# Patient Record
Sex: Female | Born: 1945
Health system: Southern US, Community
[De-identification: ages and names within clinical notes are randomized; demographics above are authoritative.]

## PROBLEM LIST (undated history)

## (undated) DIAGNOSIS — R42 Dizziness and giddiness: Secondary | ICD-10-CM

## (undated) DIAGNOSIS — M8580 Other specified disorders of bone density and structure, unspecified site: Secondary | ICD-10-CM

## (undated) DIAGNOSIS — I10 Essential (primary) hypertension: Secondary | ICD-10-CM

## (undated) DIAGNOSIS — J019 Acute sinusitis, unspecified: Secondary | ICD-10-CM

## (undated) DIAGNOSIS — M79609 Pain in unspecified limb: Secondary | ICD-10-CM

## (undated) DIAGNOSIS — K219 Gastro-esophageal reflux disease without esophagitis: Secondary | ICD-10-CM

## (undated) DIAGNOSIS — F411 Generalized anxiety disorder: Secondary | ICD-10-CM

## (undated) DIAGNOSIS — IMO0001 Reserved for inherently not codable concepts without codable children: Secondary | ICD-10-CM

## (undated) DIAGNOSIS — R1013 Epigastric pain: Secondary | ICD-10-CM

## (undated) DIAGNOSIS — D869 Sarcoidosis, unspecified: Secondary | ICD-10-CM

## (undated) DIAGNOSIS — K589 Irritable bowel syndrome without diarrhea: Secondary | ICD-10-CM

## (undated) DIAGNOSIS — J309 Allergic rhinitis, unspecified: Secondary | ICD-10-CM

## (undated) DIAGNOSIS — M549 Dorsalgia, unspecified: Secondary | ICD-10-CM

## (undated) DIAGNOSIS — M1990 Unspecified osteoarthritis, unspecified site: Secondary | ICD-10-CM

## (undated) DIAGNOSIS — T7840XA Allergy, unspecified, initial encounter: Secondary | ICD-10-CM

## (undated) DIAGNOSIS — Z86010 Personal history of colonic polyps: Secondary | ICD-10-CM

## (undated) DIAGNOSIS — R079 Chest pain, unspecified: Secondary | ICD-10-CM

## (undated) DIAGNOSIS — E785 Hyperlipidemia, unspecified: Secondary | ICD-10-CM

## (undated) DIAGNOSIS — R5383 Other fatigue: Secondary | ICD-10-CM

## (undated) DIAGNOSIS — R5381 Other malaise: Secondary | ICD-10-CM

## (undated) DIAGNOSIS — J383 Other diseases of vocal cords: Secondary | ICD-10-CM

## (undated) DIAGNOSIS — R490 Dysphonia: Secondary | ICD-10-CM

## (undated) HISTORY — DX: Dizziness and giddiness: R42

## (undated) HISTORY — PX: OTHER SURGICAL HISTORY: SHX169

## (undated) HISTORY — DX: Reserved for inherently not codable concepts without codable children: IMO0001

## (undated) HISTORY — DX: Allergic rhinitis, unspecified: J30.9

## (undated) HISTORY — DX: Gastro-esophageal reflux disease without esophagitis: K21.9

## (undated) HISTORY — DX: Hyperlipidemia, unspecified: E78.5

## (undated) HISTORY — DX: Pain in unspecified limb: M79.609

## (undated) HISTORY — DX: Irritable bowel syndrome without diarrhea: K58.9

## (undated) HISTORY — DX: Essential (primary) hypertension: I10

## (undated) HISTORY — DX: Unspecified osteoarthritis, unspecified site: M19.90

## (undated) HISTORY — DX: Allergy, unspecified, initial encounter: T78.40XA

## (undated) HISTORY — DX: Other specified disorders of bone density and structure, unspecified site: M85.80

## (undated) HISTORY — DX: Acute sinusitis, unspecified: J01.90

## (undated) HISTORY — DX: Generalized anxiety disorder: F41.1

## (undated) HISTORY — DX: Other fatigue: R53.83

## (undated) HISTORY — DX: Dorsalgia, unspecified: M54.9

## (undated) HISTORY — DX: Other malaise: R53.81

## (undated) HISTORY — DX: Chest pain, unspecified: R07.9

## (undated) HISTORY — DX: Epigastric pain: R10.13

## (undated) HISTORY — DX: Personal history of colonic polyps: Z86.010

## (undated) HISTORY — DX: Dysphonia: R49.0

## (undated) HISTORY — DX: Sarcoidosis, unspecified: D86.9

## (undated) HISTORY — DX: Other diseases of vocal cords: J38.3

---

## 1992-08-30 HISTORY — PX: ABDOMINAL HYSTERECTOMY: SHX81

## 2002-04-23 ENCOUNTER — Encounter: Payer: Self-pay | Admitting: Family Medicine

## 2002-04-23 ENCOUNTER — Encounter: Admission: RE | Admit: 2002-04-23 | Discharge: 2002-04-23 | Payer: Self-pay | Admitting: Family Medicine

## 2004-11-02 ENCOUNTER — Ambulatory Visit: Payer: Self-pay | Admitting: Gastroenterology

## 2004-11-14 ENCOUNTER — Encounter (INDEPENDENT_AMBULATORY_CARE_PROVIDER_SITE_OTHER): Payer: Self-pay | Admitting: Specialist

## 2004-11-14 ENCOUNTER — Ambulatory Visit: Payer: Self-pay | Admitting: Gastroenterology

## 2004-11-16 ENCOUNTER — Encounter: Admission: RE | Admit: 2004-11-16 | Discharge: 2004-11-16 | Payer: Self-pay | Admitting: Family Medicine

## 2005-05-07 ENCOUNTER — Other Ambulatory Visit: Admission: RE | Admit: 2005-05-07 | Discharge: 2005-05-07 | Payer: Self-pay | Admitting: Gynecology

## 2005-05-15 ENCOUNTER — Ambulatory Visit (HOSPITAL_COMMUNITY): Admission: RE | Admit: 2005-05-15 | Discharge: 2005-05-15 | Payer: Self-pay | Admitting: Gynecology

## 2005-05-15 ENCOUNTER — Encounter: Payer: Self-pay | Admitting: Internal Medicine

## 2006-05-09 ENCOUNTER — Other Ambulatory Visit: Admission: RE | Admit: 2006-05-09 | Discharge: 2006-05-09 | Payer: Self-pay | Admitting: Obstetrics and Gynecology

## 2006-11-28 ENCOUNTER — Encounter: Admission: RE | Admit: 2006-11-28 | Discharge: 2006-11-28 | Payer: Self-pay | Admitting: Family Medicine

## 2007-05-23 ENCOUNTER — Encounter: Payer: Self-pay | Admitting: Internal Medicine

## 2008-06-03 ENCOUNTER — Encounter: Payer: Self-pay | Admitting: Women's Health

## 2008-06-03 ENCOUNTER — Encounter: Payer: Self-pay | Admitting: Internal Medicine

## 2008-06-03 ENCOUNTER — Other Ambulatory Visit: Admission: RE | Admit: 2008-06-03 | Discharge: 2008-06-03 | Payer: Self-pay | Admitting: Obstetrics and Gynecology

## 2008-06-03 ENCOUNTER — Ambulatory Visit: Payer: Self-pay | Admitting: Women's Health

## 2008-06-07 ENCOUNTER — Encounter: Payer: Self-pay | Admitting: Internal Medicine

## 2008-06-07 ENCOUNTER — Encounter: Admission: RE | Admit: 2008-06-07 | Discharge: 2008-06-07 | Payer: Self-pay | Admitting: Obstetrics and Gynecology

## 2008-10-18 ENCOUNTER — Ambulatory Visit: Payer: Self-pay | Admitting: Internal Medicine

## 2008-10-18 DIAGNOSIS — D869 Sarcoidosis, unspecified: Secondary | ICD-10-CM

## 2008-10-18 DIAGNOSIS — E785 Hyperlipidemia, unspecified: Secondary | ICD-10-CM

## 2008-10-18 DIAGNOSIS — Z860100 Personal history of colon polyps, unspecified: Secondary | ICD-10-CM

## 2008-10-18 DIAGNOSIS — I10 Essential (primary) hypertension: Secondary | ICD-10-CM | POA: Insufficient documentation

## 2008-10-18 DIAGNOSIS — F411 Generalized anxiety disorder: Secondary | ICD-10-CM

## 2008-10-18 DIAGNOSIS — K589 Irritable bowel syndrome without diarrhea: Secondary | ICD-10-CM

## 2008-10-18 HISTORY — DX: Sarcoidosis, unspecified: D86.9

## 2008-10-18 HISTORY — DX: Essential (primary) hypertension: I10

## 2008-10-18 HISTORY — DX: Irritable bowel syndrome, unspecified: K58.9

## 2008-10-18 HISTORY — DX: Personal history of colonic polyps: Z86.010

## 2008-10-18 HISTORY — DX: Generalized anxiety disorder: F41.1

## 2008-10-18 HISTORY — DX: Personal history of colon polyps, unspecified: Z86.0100

## 2008-10-18 HISTORY — DX: Hyperlipidemia, unspecified: E78.5

## 2008-10-18 LAB — CONVERTED CEMR LAB
Alkaline Phosphatase: 33 units/L — ABNORMAL LOW (ref 39–117)
BUN: 19 mg/dL (ref 6–23)
Calcium: 9.4 mg/dL (ref 8.4–10.5)
Cholesterol: 206 mg/dL — ABNORMAL HIGH (ref 0–200)
Creatinine, Ser: 0.7 mg/dL (ref 0.4–1.2)
Direct LDL: 83.8 mg/dL
Eosinophils Absolute: 0.2 10*3/uL (ref 0.0–0.7)
Eosinophils Relative: 4 % (ref 0.0–5.0)
GFR calc non Af Amer: 108.32 mL/min (ref >60)
Glucose, Bld: 99 mg/dL (ref 70–99)
Leukocytes, UA: NEGATIVE
Lymphs Abs: 1.5 10*3/uL (ref 0.7–4.0)
MCHC: 34.2 g/dL (ref 30.0–36.0)
MCV: 90.5 fL (ref 78.0–100.0)
Monocytes Absolute: 0.4 10*3/uL (ref 0.1–1.0)
Monocytes Relative: 9.3 % (ref 3.0–12.0)
Neutro Abs: 2.2 10*3/uL (ref 1.4–7.7)
Potassium: 4.3 meq/L (ref 3.5–5.1)
Total CHOL/HDL Ratio: 2
VLDL: 9 mg/dL (ref 0.0–40.0)

## 2008-10-20 LAB — CONVERTED CEMR LAB: Vit D, 25-Hydroxy: 21 ng/mL — ABNORMAL LOW (ref 30–89)

## 2009-03-21 ENCOUNTER — Ambulatory Visit: Payer: Self-pay | Admitting: Internal Medicine

## 2009-03-21 DIAGNOSIS — J019 Acute sinusitis, unspecified: Secondary | ICD-10-CM

## 2009-03-21 DIAGNOSIS — IMO0001 Reserved for inherently not codable concepts without codable children: Secondary | ICD-10-CM

## 2009-03-21 HISTORY — DX: Acute sinusitis, unspecified: J01.90

## 2009-03-21 HISTORY — DX: Reserved for inherently not codable concepts without codable children: IMO0001

## 2009-04-04 ENCOUNTER — Telehealth: Payer: Self-pay | Admitting: Internal Medicine

## 2009-04-04 DIAGNOSIS — M79609 Pain in unspecified limb: Secondary | ICD-10-CM | POA: Insufficient documentation

## 2009-04-04 HISTORY — DX: Pain in unspecified limb: M79.609

## 2009-04-06 ENCOUNTER — Encounter: Payer: Self-pay | Admitting: Internal Medicine

## 2009-06-09 ENCOUNTER — Encounter: Payer: Self-pay | Admitting: Internal Medicine

## 2009-06-09 ENCOUNTER — Other Ambulatory Visit: Admission: RE | Admit: 2009-06-09 | Discharge: 2009-06-09 | Payer: Self-pay | Admitting: Gynecology

## 2009-06-09 ENCOUNTER — Ambulatory Visit: Payer: Self-pay | Admitting: Women's Health

## 2009-06-09 LAB — CONVERTED CEMR LAB: Pap Smear: NORMAL

## 2009-07-11 ENCOUNTER — Encounter: Admission: RE | Admit: 2009-07-11 | Discharge: 2009-07-11 | Payer: Self-pay | Admitting: Gynecology

## 2009-07-11 LAB — HM MAMMOGRAPHY: HM Mammogram: NEGATIVE

## 2009-07-20 ENCOUNTER — Ambulatory Visit (HOSPITAL_COMMUNITY): Admission: RE | Admit: 2009-07-20 | Discharge: 2009-07-20 | Payer: Self-pay | Admitting: Gynecology

## 2009-08-02 ENCOUNTER — Ambulatory Visit: Payer: Self-pay | Admitting: Internal Medicine

## 2009-08-02 DIAGNOSIS — J309 Allergic rhinitis, unspecified: Secondary | ICD-10-CM

## 2009-08-02 DIAGNOSIS — M549 Dorsalgia, unspecified: Secondary | ICD-10-CM

## 2009-08-02 HISTORY — DX: Dorsalgia, unspecified: M54.9

## 2009-08-02 HISTORY — DX: Allergic rhinitis, unspecified: J30.9

## 2009-09-06 ENCOUNTER — Ambulatory Visit: Payer: Self-pay | Admitting: Internal Medicine

## 2009-09-06 DIAGNOSIS — E785 Hyperlipidemia, unspecified: Secondary | ICD-10-CM

## 2009-09-06 DIAGNOSIS — R490 Dysphonia: Secondary | ICD-10-CM

## 2009-09-06 DIAGNOSIS — R5383 Other fatigue: Secondary | ICD-10-CM

## 2009-09-06 DIAGNOSIS — R5381 Other malaise: Secondary | ICD-10-CM

## 2009-09-06 DIAGNOSIS — J383 Other diseases of vocal cords: Secondary | ICD-10-CM

## 2009-09-06 HISTORY — DX: Other diseases of vocal cords: J38.3

## 2009-09-06 HISTORY — DX: Dysphonia: R49.0

## 2009-09-06 HISTORY — DX: Other malaise: R53.81

## 2009-09-07 LAB — CONVERTED CEMR LAB: Vit D, 25-Hydroxy: 27 ng/mL — ABNORMAL LOW (ref 30–89)

## 2009-09-08 LAB — CONVERTED CEMR LAB
AST: 43 units/L — ABNORMAL HIGH (ref 0–37)
Basophils Absolute: 0 10*3/uL (ref 0.0–0.1)
Chloride: 104 meq/L (ref 96–112)
Cholesterol: 217 mg/dL — ABNORMAL HIGH (ref 0–200)
Creatinine, Ser: 0.9 mg/dL (ref 0.4–1.2)
Direct LDL: 95.2 mg/dL
Eosinophils Relative: 1.7 % (ref 0.0–5.0)
Folate: 7.1 ng/mL
Hemoglobin: 12.9 g/dL (ref 12.0–15.0)
Iron: 74 ug/dL (ref 42–145)
Ketones, ur: NEGATIVE mg/dL
Lymphocytes Relative: 29.9 % (ref 12.0–46.0)
Lymphs Abs: 1.2 10*3/uL (ref 0.7–4.0)
MCHC: 33.3 g/dL (ref 30.0–36.0)
MCV: 92.4 fL (ref 78.0–100.0)
Monocytes Absolute: 0.3 10*3/uL (ref 0.1–1.0)
Potassium: 3.8 meq/L (ref 3.5–5.1)
RBC: 4.19 M/uL (ref 3.87–5.11)
Sed Rate: 18 mm/hr (ref 0–22)
Sodium: 143 meq/L (ref 135–145)
Total Bilirubin: 0.6 mg/dL (ref 0.3–1.2)
Total CHOL/HDL Ratio: 2
Triglycerides: 47 mg/dL (ref 0.0–149.0)
Urine Glucose: NEGATIVE mg/dL
Urobilinogen, UA: 0.2 (ref 0.0–1.0)
VLDL: 9.4 mg/dL (ref 0.0–40.0)
Vitamin B-12: 393 pg/mL (ref 211–911)

## 2009-10-03 ENCOUNTER — Encounter: Payer: Self-pay | Admitting: Internal Medicine

## 2009-10-04 ENCOUNTER — Telehealth: Payer: Self-pay | Admitting: Internal Medicine

## 2009-11-09 ENCOUNTER — Encounter (INDEPENDENT_AMBULATORY_CARE_PROVIDER_SITE_OTHER): Payer: Self-pay | Admitting: *Deleted

## 2009-11-10 ENCOUNTER — Ambulatory Visit: Payer: Self-pay | Admitting: Gastroenterology

## 2009-11-24 ENCOUNTER — Ambulatory Visit: Payer: Self-pay | Admitting: Gastroenterology

## 2009-11-24 HISTORY — PX: COLONOSCOPY: SHX174

## 2009-11-24 LAB — HM COLONOSCOPY

## 2009-11-28 ENCOUNTER — Encounter: Payer: Self-pay | Admitting: Gastroenterology

## 2009-12-05 ENCOUNTER — Ambulatory Visit: Payer: Self-pay | Admitting: Internal Medicine

## 2009-12-05 DIAGNOSIS — R42 Dizziness and giddiness: Secondary | ICD-10-CM | POA: Insufficient documentation

## 2009-12-05 DIAGNOSIS — R079 Chest pain, unspecified: Secondary | ICD-10-CM | POA: Insufficient documentation

## 2009-12-05 HISTORY — DX: Chest pain, unspecified: R07.9

## 2009-12-05 HISTORY — DX: Dizziness and giddiness: R42

## 2009-12-06 DIAGNOSIS — K219 Gastro-esophageal reflux disease without esophagitis: Secondary | ICD-10-CM

## 2009-12-06 HISTORY — DX: Gastro-esophageal reflux disease without esophagitis: K21.9

## 2009-12-22 ENCOUNTER — Telehealth (INDEPENDENT_AMBULATORY_CARE_PROVIDER_SITE_OTHER): Payer: Self-pay

## 2009-12-26 ENCOUNTER — Encounter (HOSPITAL_COMMUNITY): Admission: RE | Admit: 2009-12-26 | Discharge: 2010-03-08 | Payer: Self-pay | Admitting: Internal Medicine

## 2009-12-26 ENCOUNTER — Encounter: Payer: Self-pay | Admitting: Cardiology

## 2009-12-26 ENCOUNTER — Ambulatory Visit: Payer: Self-pay | Admitting: Cardiology

## 2009-12-26 ENCOUNTER — Ambulatory Visit: Payer: Self-pay

## 2009-12-26 ENCOUNTER — Encounter: Payer: Self-pay | Admitting: Internal Medicine

## 2009-12-26 ENCOUNTER — Ambulatory Visit (HOSPITAL_COMMUNITY): Admission: RE | Admit: 2009-12-26 | Discharge: 2009-12-26 | Payer: Self-pay | Admitting: Internal Medicine

## 2009-12-28 ENCOUNTER — Ambulatory Visit: Payer: Self-pay | Admitting: Internal Medicine

## 2010-01-05 ENCOUNTER — Ambulatory Visit: Payer: Self-pay | Admitting: Cardiology

## 2010-01-05 ENCOUNTER — Ambulatory Visit: Payer: Self-pay | Admitting: Gastroenterology

## 2010-01-05 ENCOUNTER — Inpatient Hospital Stay (HOSPITAL_COMMUNITY): Admission: EM | Admit: 2010-01-05 | Discharge: 2010-01-07 | Payer: Self-pay | Admitting: Emergency Medicine

## 2010-01-08 HISTORY — PX: UPPER GASTROINTESTINAL ENDOSCOPY: SHX188

## 2010-01-09 ENCOUNTER — Ambulatory Visit: Payer: Self-pay | Admitting: Internal Medicine

## 2010-01-09 DIAGNOSIS — R1013 Epigastric pain: Secondary | ICD-10-CM

## 2010-01-09 HISTORY — DX: Epigastric pain: R10.13

## 2010-01-17 ENCOUNTER — Telehealth: Payer: Self-pay | Admitting: Internal Medicine

## 2010-01-18 ENCOUNTER — Telehealth: Payer: Self-pay | Admitting: Gastroenterology

## 2010-01-24 ENCOUNTER — Telehealth: Payer: Self-pay | Admitting: Internal Medicine

## 2010-01-26 ENCOUNTER — Ambulatory Visit (HOSPITAL_COMMUNITY): Admission: RE | Admit: 2010-01-26 | Discharge: 2010-01-26 | Payer: Self-pay | Admitting: Internal Medicine

## 2010-02-01 ENCOUNTER — Ambulatory Visit (HOSPITAL_COMMUNITY): Admission: RE | Admit: 2010-02-01 | Discharge: 2010-02-01 | Payer: Self-pay | Admitting: Internal Medicine

## 2010-07-23 ENCOUNTER — Encounter: Payer: Self-pay | Admitting: Gynecology

## 2010-08-01 NOTE — Letter (Signed)
Summary: Patient Notice- Polyp Results  [facility]  [address] [place], [place] [address]   Phone: [phone]  Fax: [phone]        Nov 28, 2009 MRN: [id number]    Briana Schroeder [address] [place], [place]  [address]    Dear [name],  I am pleased to inform you that the colon polyp(s) removed during your recent colonoscopy was (were) found to be benign (no cancer detected) upon pathologic examination.  I recommend you have a repeat colonoscopy examination in 5 years to look for recurrent polyps, as having colon polyps increases your risk for having recurrent polyps or even colon cancer in the future.  Should you develop new or worsening symptoms of abdominal pain, bowel habit changes or bleeding from the rectum or bowels, please schedule an evaluation with either your primary care physician or with me.  Continue treatment plan as outlined the day of your exam.  Please call us if you are having persistent problems or have questions about your condition that have not been fully answered at this time.  Sincerely,  Meryl Dare MD [facility]  This letter has been electronically signed by your physician.  Appended Document: Patient Notice- Polyp Results letter mailed.

## 2010-08-01 NOTE — Assessment & Plan Note (Signed)
Summary: FOLLOW UP-LB   Vital Signs:  Patient profile:   65 year old female Height:      63 inches Weight:      141 pounds BMI:     25.07 O2 Sat:      97 % on Room air Temp:     98.3 degrees F oral Pulse rate:   64 / minute BP sitting:   130 / 78  (left arm) Cuff size:   regular  Vitals Entered ByZella Ball Ewing (December 05, 2009 1:42 PM)  O2 Flow:  Room air  CC: followup/RE   CC:  followup/RE.  History of Present Illness: here with c/o 8 days onset intermittent SSCP, dull and sharp, pressure-like but non pleuritic, non exertional but not obvious reflux as well ;  is assoc with mild sob, nausea, mild sweats on occasion, radiation towards both shoulders and tingling in the arms and legs.  Has some intermittent dizziness as well, not always assoc with the chest symptoms, but not severe and no frank syncope, no palps.  No headache, ST, fever, cough, overt nasal sinus symptoms, wheezing, and Pt denies  orthopnea, pnd, worsening LE edema.  Has intermittent hoarseness worse in the past wk as well, has seen ENT in the past for this and asked to take PPI but simply has not been taking on a regular basis.  No dysphagia, vomiting, wt loss, fever, abd pain, diarrhea, rash or joint pains.  no known hx of CV problem or vascular dz.  CRF's include HTN, and elev cholesterol.  Has not had echo or stress test in the past.    Problems Prior to Update: 1)  Dizziness  (ICD-780.4) 2)  Chest Pain  (ICD-786.50) 3)  Fatigue  (ICD-780.79) 4)  Hoarseness  (ICD-784.42) 5)  Vocal Cord Nodule  (ICD-478.5) 6)  Allergic Rhinitis  (ICD-477.9) 7)  Back Pain  (ICD-724.5) 8)  Arm Pain, Right  (ICD-729.5) 9)  Myalgia  (ICD-729.1) 10)  Sinusitis- Acute-nos  (ICD-461.9) 11)  Preventive Health Care  (ICD-V70.0) 12)  Pulmonary Sarcoidosis  (ICD-135) 13)  Colonic Polyps, Hx of  (ICD-V12.72) 14)  Ibs  (ICD-564.1) 15)  Anxiety  (ICD-300.00) 16)  Hypertension  (ICD-401.9) 17)  Hyperlipidemia  (ICD-272.4)  Medications  Prior to Update: 1)  Norvasc 10 Mg Tabs (Amlodipine Besylate) .Marland Kitchen.. 1 By Mouth Once Daily 2)  Hydrochlorothiazide 25 Mg Tabs (Hydrochlorothiazide) .Marland Kitchen.. 1 By Mouth Once Daily 3)  Adult Aspirin Ec Low Strength 81 Mg Tbec (Aspirin) .Marland Kitchen.. 1po Once Daily 4)  Tramadol Hcl 50 Mg Tabs (Tramadol Hcl) .Marland Kitchen.. 1 By Mouth Q 6 Hrs As Needed Pain 5)  Fexofenadine Hcl 180 Mg Tabs (Fexofenadine Hcl) .Marland Kitchen.. 1 By Mouth Once Daily As Needed Allergies 6)  Fluticasone Propionate 50 Mcg/act Susp (Fluticasone Propionate) .... 2 Spray/side Once Daily 7)  Cephalexin 500 Mg Caps (Cephalexin) .Marland Kitchen.. 1po Three Times A Day  Current Medications (verified): 1)  Norvasc 10 Mg Tabs (Amlodipine Besylate) .Marland Kitchen.. 1 By Mouth Once Daily 2)  Hydrochlorothiazide 25 Mg Tabs (Hydrochlorothiazide) .Marland Kitchen.. 1 By Mouth Once Daily 3)  Adult Aspirin Ec Low Strength 81 Mg Tbec (Aspirin) .Marland Kitchen.. 1po Once Daily 4)  Tramadol Hcl 50 Mg Tabs (Tramadol Hcl) .Marland Kitchen.. 1 By Mouth Q 6 Hrs As Needed Pain 5)  Fexofenadine Hcl 180 Mg Tabs (Fexofenadine Hcl) .Marland Kitchen.. 1 By Mouth Once Daily As Needed Allergies 6)  Fluticasone Propionate 50 Mcg/act Susp (Fluticasone Propionate) .... 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   Head:  normocephalic and atraumatic.   Eyes:  vision grossly intact, pupils equal, and pupils round.   Ears:  R ear normal and L ear normal.   Nose:  no external deformity and no nasal discharge.   Mouth:  no gingival abnormalities and pharynx pink and moist.   Neck:  supple and no masses.   Lungs:  normal respiratory effort and normal breath sounds.   Heart:  normal rate, regular rhythm, no murmur, and no JVD.   Abdomen:  soft, non-tender, and normal bowel sounds.   Msk:  no chest wall tenderness Extremities:  no edema, no erythema  Neurologic:  cranial nerves II-XII intact, strength normal in all extremities, and gait normal.   Skin:  no rashes.     Impression & Recommendations:  Problem # 1:  CHEST PAIN (ICD-786.50) atypical ; ecg reviewed;  will need stress test to further evaluate Orders: EKG w/ Interpretation (93000) Cardiolite (Cardiolite)  Problem # 2:  DIZZINESS (ICD-780.4)  Her updated medication list for this problem includes:    Fexofenadine Hcl 180 Mg Tabs (Fexofenadine hcl) .Marland Kitchen... 1 by mouth once daily as needed allergies  Orders: Echo Referral (Echo) Radiology Referral (Radiology) etiology unclear;  for echo and carotid dopplers, exam o/w benign, consider Card and/or neuro eval  Problem # 3:  HOARSENESS (ZOX-096.04) to re-start the PPI  with apparent extra-esoph manifestations  Complete Medication List: 1)  Norvasc 10 Mg Tabs (Amlodipine besylate) .Marland Kitchen.. 1 by mouth once daily 2)  Hydrochlorothiazide 25 Mg Tabs (Hydrochlorothiazide) .Marland Kitchen.. 1 by mouth once daily 3)  Adult Aspirin Ec Low Strength 81 Mg Tbec (Aspirin) .Marland Kitchen.. 1po once daily 4)  Tramadol Hcl 50 Mg Tabs (Tramadol hcl) .Marland Kitchen.. 1 by mouth q 6 hrs as needed pain 5)  Fexofenadine Hcl 180 Mg Tabs (Fexofenadine hcl) .Marland Kitchen.. 1 by mouth once daily as needed allergies 6)  Fluticasone Propionate 50 Mcg/act Susp (Fluticasone propionate) .... 2 spray/side once daily 7)   Omeprazole 20 Mg Cpdr (Omeprazole) .Marland Kitchen.. 1po once daily  Patient Instructions: 1)  your EKG was ok today 2)  you are given the copies of your recent chest xray and blood work 3)  Please take all new medications as prescribed  - the stomach medicine 4)  You will be contacted about the referral(s) to: Stress test, and echocardiogram 5)  Please schedule a follow-up appointment in march 2012 with CPX labs, or sooner if needed Prescriptions: OMEPRAZOLE 20 MG CPDR (OMEPRAZOLE) 1po once daily  #90 x 3   Entered and Authorized by:   Corwin Levins MD   Signed by:   Corwin Levins MD on 12/05/2009   Method used:   Print then Give to Patient   RxID:   [id number]

## 2010-08-01 NOTE — Assessment & Plan Note (Signed)
Summary: CHEST PAIN/ DECREASED APPATITE/ PER TRIAGE/NWS   Vital Signs:  Patient profile:   65 year old female Height:      63 inches Weight:      136.50 pounds BMI:     24.27 O2 Sat:      97 % on Room air Temp:     98.5 degrees F oral Pulse rate:   65 / minute BP sitting:   124 / 84  (left arm) Cuff size:   regular  Vitals Entered By: Zella Ball Ewing CMA Duncan Dull) (December 28, 2009 11:05 AM)  O2 Flow:  Room air CC: Chest pain, decreased appetite, losing weight/RE   CC:  Chest pain, decreased appetite, and losing weight/RE.  History of Present Illness: here for f/u;  recent stress test and echo without significant abnormality; pt c/o persistent lower chest pain with radiation towards the neck and bilat chest, but nonpleuritc and nonexertional;  has good compliance with omeprazole 20 mg;  Pt denies other CP, sob, doe, wheezing, orthopnea, pnd, worsening LE edema, palps, dizziness or syncope .  Pt denies new neuro symptoms such as headache, facial or extremity weakness   Has had significant stres related to her daughter's mental illness (also seen here).  No fever, ST, cough, GU symptoms such dysuria, dysphagia, n/v or other abd pain.   Recent cxr mar 2011 neg as well .  Denies worsening depressive symtpoms, suicidal ideation or panic, but more tension lately.   Problems Prior to Update: 1)  Gerd  (ICD-530.81) 2)  Dizziness  (ICD-780.4) 3)  Chest Pain  (ICD-786.50) 4)  Fatigue  (ICD-780.79) 5)  Hoarseness  (ICD-784.42) 6)  Vocal Cord Nodule  (ICD-478.5) 7)  Allergic Rhinitis  (ICD-477.9) 8)  Back Pain  (ICD-724.5) 9)  Arm Pain, Right  (ICD-729.5) 10)  Myalgia  (ICD-729.1) 11)  Sinusitis- Acute-nos  (ICD-461.9) 12)  Preventive Health Care  (ICD-V70.0) 13)  Pulmonary Sarcoidosis  (ICD-135) 14)  Colonic Polyps, Hx of  (ICD-V12.72) 15)  Ibs  (ICD-564.1) 16)  Anxiety  (ICD-300.00) 17)  Hypertension  (ICD-401.9) 18)  Hyperlipidemia  (ICD-272.4)  Medications Prior to Update: 1)  Norvasc 10  Mg Tabs (Amlodipine Besylate) .Marland Kitchen.. 1 By Mouth Once Daily 2)  Hydrochlorothiazide 25 Mg Tabs (Hydrochlorothiazide) .Marland Kitchen.. 1 By Mouth Once Daily 3)  Adult Aspirin Ec Low Strength 81 Mg Tbec (Aspirin) .Marland Kitchen.. 1po Once Daily 4)  Tramadol Hcl 50 Mg Tabs (Tramadol Hcl) .Marland Kitchen.. 1 By Mouth Q 6 Hrs As Needed Pain 5)  Fexofenadine Hcl 180 Mg Tabs (Fexofenadine Hcl) .Marland Kitchen.. 1 By Mouth Once Daily As Needed Allergies 6)  Fluticasone Propionate 50 Mcg/act Susp (Fluticasone Propionate) .... 2 Spray/side Once Daily 7)  Omeprazole 20 Mg Cpdr (Omeprazole) .Marland Kitchen.. 1po Once Daily  Current Medications (verified): 1)  Norvasc 10 Mg Tabs (Amlodipine Besylate) .Marland Kitchen.. 1 By Mouth Once Daily 2)  Hydrochlorothiazide 25 Mg Tabs (Hydrochlorothiazide) .Marland Kitchen.. 1 By Mouth Once Daily 3)  Adult Aspirin Ec Low Strength 81 Mg Tbec (Aspirin) .Marland Kitchen.. 1po Once Daily 4)  Tramadol Hcl 50 Mg Tabs (Tramadol Hcl) .Marland Kitchen.. 1 By Mouth Q 6 Hrs As Needed Pain 5)  Fexofenadine Hcl 180 Mg Tabs (Fexofenadine Hcl) .Marland Kitchen.. 1 By Mouth Once Daily As Needed Allergies 6)  Fluticasone Propionate 50 Mcg/act Susp (Fluticasone Propionate) .... 2 Spray/side Once Daily 7)  Nexium 40 Mg Cpdr (Esomeprazole Magnesium) .Marland Kitchen.. 1po Once Daily 8)  Citalopram Hydrobromide 10 Mg Tabs (Citalopram Hydrobromide) .Marland Kitchen.. 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   Head:  normocephalic and atraumatic.   Eyes:  vision grossly intact, pupils equal, and pupils round.   Ears:  R ear normal and L ear normal.   Nose:  no external deformity and no nasal discharge.   Mouth:  no gingival abnormalities and pharynx pink and moist.   Neck:  supple and no masses.   Lungs:  normal respiratory effort and normal breath sounds.   Heart:  normal rate and regular rhythm.   Abdomen:  soft and normal bowel sounds.  with tender epigastrium today Msk:  no joint tenderness and no joint swelling.   - no signifcant chest wall or costal margin tenderness as she points to this area bilateral as area of discomfort Extremities:  no edema, no erythema  Psych:  not depressed appearing and moderately anxious.     Impression & Recommendations:  Problem # 1:  GERD (ICD-530.81)  Her updated medication list for this problem includes:    Nexium 40 Mg Cpdr (Esomeprazole magnesium) .Marland Kitchen... 1po once daily ok to change PPI to nexium  Problem # 2:  CHEST PAIN (ICD-786.50) I suspect most lkely related to above - treat as above, f/u any worsening signs or symptoms   Problem # 3:  HYPERTENSION (ICD-401.9)  Her updated medication list for this problem includes:    Norvasc 10 Mg Tabs (Amlodipine besylate) .Marland Kitchen... 1 by mouth once daily    Hydrochlorothiazide 25 Mg Tabs (Hydrochlorothiazide) .Marland Kitchen... 1 by mouth once daily  BP today: 124/84 Prior BP: 130/78 (12/05/2009)  Labs Reviewed: K+: 3.8 (09/06/2009) Creat: : 0.9 (09/06/2009)   Chol: 217 (09/06/2009)   HDL: 110.70 (09/06/2009)   TG: 47.0 (09/06/2009) stable overall by hx and exam, ok to continue meds/tx as is   Problem # 4:  ANXIETY (ICD-300.00)  Her updated medication list for this problem includes:    Citalopram Hydrobromide 10 Mg Tabs (Citalopram hydrobromide) .Marland Kitchen... 1 by mouth once daily treat as above, f/u any worsening signs or symptoms   Complete Medication List: 1)  Norvasc 10 Mg Tabs (Amlodipine  besylate) .Marland Kitchen.. 1 by mouth once daily 2)  Hydrochlorothiazide 25 Mg Tabs (Hydrochlorothiazide) .Marland Kitchen.. 1 by mouth once daily 3)  Adult Aspirin Ec Low Strength 81 Mg Tbec (Aspirin) .Marland Kitchen.. 1po once daily 4)  Tramadol Hcl 50 Mg Tabs (Tramadol hcl) .Marland Kitchen.. 1 by mouth q 6 hrs as needed pain 5)  Fexofenadine Hcl 180 Mg Tabs (Fexofenadine hcl) .Marland Kitchen.. 1 by mouth once daily as needed allergies 6)  Fluticasone Propionate 50 Mcg/act Susp (Fluticasone propionate) .... 2 spray/side once daily 7)  Nexium 40 Mg Cpdr (Esomeprazole magnesium) .Marland Kitchen.. 1po once daily 8)  Citalopram Hydrobromide 10 Mg Tabs (Citalopram hydrobromide) .Marland Kitchen.. 1 by mouth once daily  Patient Instructions: 1)  Please take all new medications as prescribed 2)  Continue all previous medications as before this visit  3)  Please schedule a follow-up  appointment in March 2012 for "yearly exam" or sooner if needed Prescriptions: CITALOPRAM HYDROBROMIDE 10 MG TABS (CITALOPRAM HYDROBROMIDE) 1 by mouth once daily  #90 x 3   Entered and Authorized by:   Corwin Levins MD   Signed by:   Corwin Levins MD on 12/28/2009   Method used:   Print then Give to Patient   RxID:   [id number] NEXIUM 40 MG CPDR (ESOMEPRAZOLE MAGNESIUM) 1po once daily  #30 x 11   Entered and Authorized by:   Corwin Levins MD   Signed by:   Corwin Levins MD on 12/28/2009   Method used:   Print then Give to Patient   RxID:   [id number]

## 2010-08-01 NOTE — Assessment & Plan Note (Signed)
Summary: CPX/ $50 /[facility]   Vital Signs:  Patient profile:   65 year old female Height:      63 inches Weight:      142 pounds BMI:     25.25 O2 Sat:      99 % on Room air Temp:     98.1 degrees F oral Pulse rate:   73 / minute BP sitting:   120 / 80  (left arm) Cuff size:   regular  Vitals Entered ByZella Ball Ewing (September 06, 2009 8:55 AM)  O2 Flow:  Room air  Preventive Care Screening  Pap Smear:    Date:  06/09/2009    Results:  normal      declines flu shot  CC: Adult Physical/RE   CC:  Adult Physical/RE.  History of Present Illness: overall doing well, does have some sinus congestion in the past 2 wks despite current meds, without pain or fever or color.  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    Here for wellness Diet: Heart Healthy or DM if diabetic Physical Activities: Sedentary Depression/mood screen: Negative Hearing: Intact bilateral Visual Acuity: Grossly normal ADL's: Capable  Fall Risk: None Home Safety: Good End-of-Life Planning: Advance directive - Full code/I agree   Problems Prior to Update: 1)  Allergic Rhinitis  (ICD-477.9) 2)  Back Pain  (ICD-724.5) 3)  Arm Pain, Right  (ICD-729.5) 4)  Myalgia  (ICD-729.1) 5)  Sinusitis- Acute-nos  (ICD-461.9) 6)  Preventive Health Care  (ICD-V70.0) 7)  Pulmonary Sarcoidosis  (ICD-135) 8)  Colonic Polyps, Hx of  (ICD-V12.72) 9)  Ibs  (ICD-564.1) 10)  Anxiety  (ICD-300.00) 11)  Hypertension  (ICD-401.9) 12)  Hyperlipidemia  (ICD-272.4)  Medications Prior to Update: 1)  Norvasc 10 Mg Tabs (Amlodipine Besylate) .Marland Kitchen.. 1 By Mouth Once Daily 2)  Hydrochlorothiazide 25 Mg Tabs (Hydrochlorothiazide) .Marland Kitchen.. 1 By Mouth Once Daily 3)  Adult Aspirin Ec Low Strength 81 Mg Tbec (Aspirin) .Marland Kitchen.. 1po Once Daily 4)   Tramadol Hcl 50 Mg Tabs (Tramadol Hcl) .Marland Kitchen.. 1 By Mouth Q 6 Hrs As Needed Pain 5)  Fexofenadine Hcl 180 Mg Tabs (Fexofenadine Hcl) .Marland Kitchen.. 1 By Mouth Once Daily As Needed Allergies 6)  Flexeril 5 Mg Tabs (Cyclobenzaprine Hcl) .Marland Kitchen.. 1 By Mouth Three Times A Day As Needed Muscle Spasm 7)  Prednisone 10 Mg Tabs (Prednisone) .... 3po Qd For 3days, Then 2po Qd For 3days, Then 1po Qd For 3days, Then Stop  Current Medications (verified): 1)  Norvasc 10 Mg Tabs (Amlodipine Besylate) .Marland Kitchen.. 1 By Mouth Once Daily 2)  Hydrochlorothiazide 25 Mg Tabs (Hydrochlorothiazide) .Marland Kitchen.. 1 By Mouth Once Daily 3)  Adult Aspirin Ec Low Strength 81 Mg Tbec (Aspirin) .Marland Kitchen.. 1po Once Daily 4)  Tramadol Hcl 50 Mg Tabs (Tramadol Hcl) .Marland Kitchen.. 1 By Mouth Q 6 Hrs As Needed Pain 5)  Fexofenadine Hcl 180 Mg Tabs (Fexofenadine Hcl) .Marland Kitchen.. 1 By Mouth Once Daily As Needed Allergies 6)  Fluticasone Propionate 50 Mcg/act Susp (Fluticasone Propionate) .... 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 Smoked Alcohol use-no  Risk  Factors: Smoking Status: never (10/18/2008)  Past Medical History: Hyperlipidemia Hypertension Anxiety IBS Colonic polyps, hx of - adenoma - 5/06 - dr stark hx of sarcoidosis Allergic rhinitis chronic hoarseness  Past Surgical History: Hysterectomy s/p right finger tendon surgury hx of vocal cord nodules  Review of Systems  The patient denies anorexia, fever, weight loss, weight gain, vision loss, decreased hearing, hoarseness, chest pain, syncope, dyspnea on exertion, peripheral edema, prolonged cough, headaches, hemoptysis, abdominal pain, melena, hematochezia, severe indigestion/heartburn, hematuria, muscle weakness, suspicious skin lesions, difficulty walking,  depression, unusual weight change, abnormal bleeding, enlarged lymph nodes, and angioedema.         all otherwise negative per pt -  , has some moild ongoing fatigue, without OSA symtpoms  Physical Exam  General:  alert and well-developed.   Head:  normocephalic and atraumatic.   Eyes:  vision grossly intact, pupils equal, and pupils round.   Ears:  R ear normal and L ear normal.   Nose:  no external deformity and nasal dischargemucosal pallor.   Mouth:  good dentition and no gingival abnormalities.   Neck:  supple and no masses.   Lungs:  normal respiratory effort and normal breath sounds.   Heart:  normal rate and regular rhythm.   Abdomen:  soft, non-tender, and normal bowel sounds.   Msk:  no joint tenderness and no joint swelling.   Extremities:  no edema, no erythema  Neurologic:  cranial nerves II-XII intact and strength normal in all extremities.     Impression & Recommendations:  Problem # 1:  Preventive Health Care (ICD-V70.0)  Overall doing well, age appropriate education and counseling updated and referral for appropriate preventive services done unless declined, immunizations up to date or declined, diet counseling done if overweight, urged to quit smoking if smokes , most recent labs reviewed and current ordered if appropriate, ecg reviewed or declined (interpretation per ECG scanned in the EMR if done); information regarding Medicare Prevention requirements given if appropriate  Orders: EKG w/ Interpretation (93000)  Problem # 2:  PULMONARY SARCOIDOSIS (ICD-135)  for f/u cxr today, o/w asympt  Orders: T-2 View CXR, Same Day (71020.5TC)  Problem # 3:  VOCAL CORD NODULE (ICD-478.5)  refer ENT for f/u, has hoarseness, ? chronic  Orders: ENT Referral (ENT)  Problem # 4:  COLONIC POLYPS, HX OF (ICD-V12.72)  dur for f/u june 2011 - will go ahead and refer   Orders: Gastroenterology Referral (GI)  Problem # 5:  ALLERGIC RHINITIS (ICD-477.9)  Her updated  medication list for this problem includes:    Fexofenadine Hcl 180 Mg Tabs (Fexofenadine hcl) .Marland Kitchen... 1 by mouth once daily as needed allergies    Fluticasone Propionate 50 Mcg/act Susp (Fluticasone propionate) .Marland Kitchen... 2 spray/side once daily treat as above, f/u any worsening signs or symptoms  - med refilled, may help with hoarseness?  Orders: Prescription Created Electronically [phone])  Problem # 6:  HYPERTENSION (ICD-401.9)  Her updated medication list for this problem includes:    Norvasc 10 Mg Tabs (Amlodipine besylate) .Marland Kitchen... 1 by mouth once daily    Hydrochlorothiazide 25 Mg Tabs (Hydrochlorothiazide) .Marland Kitchen... 1 by mouth once daily  BP today: 120/80 Prior BP: 114/72 (08/02/2009)  Labs Reviewed: K+: 4.3 (10/18/2008) Creat: : 0.7 (10/18/2008)   Chol: 206 (10/18/2008)   HDL: 90.00 (10/18/2008)   TG: 45.0 (10/18/2008) stable overall by hx and exam, ok to continue meds/tx as is   Problem # 7:  HYPERLIPIDEMIA (ICD-272.4)  Labs Reviewed: SGOT: 31 (10/18/2008)  SGPT: 26 (10/18/2008)   HDL:90.00 (10/18/2008)  Chol:206 (10/18/2008)  Trig:45.0 (10/18/2008)  controlled with diet - ok to follow; Pt to continue diet efforts, good med tolerance; to check labs - goal LDL less than 100  Orders: TLB-Lipid Panel (80061-LIPID)  Problem # 8:  HOARSENESS (ICD-784.42)  as above  Problem # 9:  FATIGUE (ICD-780.79)  mild, exam benign, to check labs below; follow with expectant management   Orders: TLB-BMP (Basic Metabolic Panel-BMET) (80048-METABOL) TLB-CBC Platelet - w/Differential (85025-CBCD) TLB-Hepatic/Liver Function Pnl (80076-HEPATIC) TLB-TSH (Thyroid Stimulating Hormone) (84443-TSH) TLB-Sedimentation Rate (ESR) (85652-ESR) TLB-IBC Pnl (Iron/FE;Transferrin) (83550-IBC) TLB-B12 + Folate Pnl (82746_82607-B12/FOL) T-Vitamin D (25-Hydroxy) ([address]-60454) TLB-Udip ONLY (81003-UDIP)  Complete Medication List: 1)  Norvasc 10 Mg Tabs (Amlodipine besylate) .Marland Kitchen.. 1 by mouth once daily 2)   Hydrochlorothiazide 25 Mg Tabs (Hydrochlorothiazide) .Marland Kitchen.. 1 by mouth once daily 3)  Adult Aspirin Ec Low Strength 81 Mg Tbec (Aspirin) .Marland Kitchen.. 1po once daily 4)  Tramadol Hcl 50 Mg Tabs (Tramadol hcl) .Marland Kitchen.. 1 by mouth q 6 hrs as needed pain 5)  Fexofenadine Hcl 180 Mg Tabs (Fexofenadine hcl) .Marland Kitchen.. 1 by mouth once daily as needed allergies 6)  Fluticasone Propionate 50 Mcg/act Susp (Fluticasone propionate) .... 2 spray/side once daily  Patient Instructions: 1)  Please go to Radiology in the basement level for your X-Ray today  2)  Please go to the Lab in the basement for your blood and/or urine tests today  3)  You will be contacted about the referral(s) to: ENT, and colonscopy 4)  you had the pneumonia shot today 5)  Please take all new medications as prescribed 6)  Continue all previous medications as before this visit  7)  Please schedule a follow-up appointment in 1 year or sooner if needed 8)  Check your Blood Pressure regularly. If it is above 140/90: you should make an appointment. Prescriptions: NORVASC 10 MG TABS (AMLODIPINE BESYLATE) 1 by mouth once daily  #90 x 3   Entered and Authorized by:   Corwin Levins MD   Signed by:   Corwin Levins MD on 09/06/2009   Method used:   Print then Give to Patient   RxID:   [id number] HYDROCHLOROTHIAZIDE 25 MG TABS (HYDROCHLOROTHIAZIDE) 1 by mouth once daily  #90 x 3   Entered and Authorized by:   Corwin Levins MD   Signed by:   Corwin Levins MD on 09/06/2009   Method used:   Print then Give to Patient   RxID:   [id number] FEXOFENADINE HCL 180 MG TABS (FEXOFENADINE HCL) 1 by mouth once daily as needed allergies  #30 x 11   Entered and Authorized by:   Corwin Levins MD   Signed by:   Corwin Levins MD on 09/06/2009   Method used:   Print then Give to Patient   RxID:   [id number] FLUTICASONE PROPIONATE 50 MCG/ACT SUSP (FLUTICASONE PROPIONATE) 2 spray/side once daily  #1 x 11   Entered and Authorized by:   Corwin Levins MD   Signed by:    Corwin Levins MD on 09/06/2009   Method used:   Print then Give to Patient   RxID:   [id number]     Appended Document: Immunization Entry      Immunizations Administered:  Pneumonia Vaccine:    Vaccine Type: Pneumovax    Site: left deltoid    Mfr: Merck    Dose: 0.5 ml    Route: IM    Given by:  Robin Ewing    Exp. Date: 02/13/2011    Lot #: [id number]    VIS given: 01/28/96 version given September 06, 2009.

## 2010-08-01 NOTE — Progress Notes (Signed)
Summary: UTI sys  Phone Note Call from Patient   Caller: Patient [phone] Summary of Call: pt called stating that she is now experiencing UTI sys: Frequency, burning and odor. Pt is requesting an ABX to treat. please advise Initial call taken by: Margaret Pyle, CMA,  October 04, 2009 9:11 AM  Follow-up for Phone Call        done hardcopy to [facility] side B - dahlia  Follow-up by: Corwin Levins MD,  October 04, 2009 10:41 AM  Additional Follow-up for Phone Call Additional follow up Details #1::        Rx faxed to pharmacy Additional Follow-up by: Margaret Pyle, CMA,  October 04, 2009 12:00 PM    New/Updated Medications: CEPHALEXIN 500 MG CAPS (CEPHALEXIN) 1po three times a day Prescriptions: CEPHALEXIN 500 MG CAPS (CEPHALEXIN) 1po three times a day  #30 x 0   Entered and Authorized by:   Corwin Levins MD   Signed by:   Corwin Levins MD on 10/04/2009   Method used:   Print then Give to Patient   RxID:   [id number]

## 2010-08-01 NOTE — Letter (Signed)
Summary: [facility] Instructions  [facility]  [address] [place], [place] [address]   Phone: [phone]  Fax: [phone]       Briana Schroeder    1946/02/27    MRN: [id number]        Procedure Day Dorna Bloom:  Lenor Coffin  11/24/09     Arrival Time:  9:00AM     Procedure Time:  10:00AM     Location of Procedure:                    Juliann Pares _  [facility] (4th Floor)                        PREPARATION FOR COLONOSCOPY WITH MOVIPREP   Starting 5 days prior to your procedure 11/19/09 do not eat nuts, seeds, popcorn, corn, beans, peas,  salads, or any raw vegetables.  Do not take any fiber supplements (e.g. Metamucil, Citrucel, and Benefiber).  THE DAY BEFORE YOUR PROCEDURE         DATE: 11/23/09  DAY: WEDNESDAY  1.  Drink clear liquids the entire day-NO SOLID FOOD  2.  Do not drink anything colored red or purple.  Avoid juices with pulp.  No orange juice.  3.  Drink at least 64 oz. (8 glasses) of fluid/clear liquids during the day to prevent dehydration and help the prep work efficiently.  CLEAR LIQUIDS INCLUDE: Water Jello Ice Popsicles Tea (sugar ok, no milk/cream) Powdered fruit flavored drinks Coffee (sugar ok, no milk/cream) Gatorade Juice: apple, white grape, white cranberry  Lemonade Clear bullion, consomm, broth Carbonated beverages (any kind) Strained chicken noodle soup Hard Candy                             4.  In the morning, mix first dose of MoviPrep solution:    Empty 1 Pouch A and 1 Pouch B into the disposable container    Add lukewarm drinking water to the top line of the container. Mix to dissolve    Refrigerate (mixed solution should be used within 24 hrs)  5.  Begin drinking the prep at 5:00 p.m. The MoviPrep container is divided by 4 marks.   Every 15 minutes drink the solution down to the next mark (approximately 8 oz) until the full liter is complete.   6.  Follow completed prep with 16 oz of clear liquid of your choice  (Nothing red or purple).  Continue to drink clear liquids until bedtime.  7.  Before going to bed, mix second dose of MoviPrep solution:    Empty 1 Pouch A and 1 Pouch B into the disposable container    Add lukewarm drinking water to the top line of the container. Mix to dissolve    Refrigerate  THE DAY OF YOUR PROCEDURE      DATE: 11/24/09  DAY: THURSDAY  Beginning at 5:00AM (5 hours before procedure):         1. Every 15 minutes, drink the solution down to the next mark (approx 8 oz) until the full liter is complete.  2. Follow completed prep with 16 oz. of clear liquid of your choice.    3. You may drink clear liquids until 8:00AM (2 HOURS BEFORE PROCEDURE).   MEDICATION INSTRUCTIONS  Unless otherwise instructed, you should take regular prescription medications with a small sip of water   as early as possible the morning  of your procedure.    Additional medication instructions: _Hold HCTZ morning of procedure         OTHER INSTRUCTIONS  You will need a responsible adult at least 66 years of age to accompany you and drive you home.   This person must remain in the waiting room during your procedure.  Wear loose fitting clothing that is easily removed.  Leave jewelry and other valuables at home.  However, you may wish to bring a book to read or  an iPod/MP3 player to listen to music as you wait for your procedure to start.  Remove all body piercing jewelry and leave at home.  Total time from sign-in until discharge is approximately 2-3 hours.  You should go home directly after your procedure and rest.  You can resume normal activities the  day after your procedure.  The day of your procedure you should not:   Drive   Make legal decisions   Operate machinery   Drink alcohol   Return to work  You will receive specific instructions about eating, activities and medications before you leave.    The above instructions have been reviewed and explained to me  by   Clide Cliff, RN_______________________    I fully understand and can verbalize these instructions _____________________________ Date _________

## 2010-08-01 NOTE — Assessment & Plan Note (Signed)
Summary: post hosp  stc   Vital Signs:  Patient profile:   65 year old female Height:      63 inches Weight:      133.50 pounds BMI:     23.73 O2 Sat:      98 % on Room air Temp:     98.5 degrees F oral Pulse rate:   66 / minute BP sitting:   124 / 70  (left arm) Cuff size:   regular  Vitals Entered By: Zella Ball Ewing CMA (AAMA) (January 09, 2010 2:05 PM)  O2 Flow:  Room air CC: Post Hospital/RE   CC:  Post Hospital/RE.  History of Present Illness: here to f/u; despite recent admit with neg egd; still with epigastric pain, worse with eating, food seems to just not go farther or digest, some nausea but no vomiting, has had 9 lb wt loss per pt;  Pt denies CP, sob, doe, wheezing, orthopnea, pnd, worsening LE edema, palps, dizziness or syncope  Pt denies new neuro symptoms such as headache, facial or extremity weakness  Overall good complaicne with meds, good med tolerance.    Problems Prior to Update: 1)  Epigastric Pain  (ICD-789.06) 2)  Gerd  (ICD-530.81) 3)  Dizziness  (ICD-780.4) 4)  Chest Pain  (ICD-786.50) 5)  Fatigue  (ICD-780.79) 6)  Hoarseness  (ICD-784.42) 7)  Vocal Cord Nodule  (ICD-478.5) 8)  Allergic Rhinitis  (ICD-477.9) 9)  Back Pain  (ICD-724.5) 10)  Arm Pain, Right  (ICD-729.5) 11)  Myalgia  (ICD-729.1) 12)  Sinusitis- Acute-nos  (ICD-461.9) 13)  Preventive Health Care  (ICD-V70.0) 14)  Pulmonary Sarcoidosis  (ICD-135) 15)  Colonic Polyps, Hx of  (ICD-V12.72) 16)  Ibs  (ICD-564.1) 17)  Anxiety  (ICD-300.00) 18)  Hypertension  (ICD-401.9) 19)  Hyperlipidemia  (ICD-272.4)  Medications Prior to Update: 1)  Norvasc 10 Mg Tabs (Amlodipine Besylate) .Marland Kitchen.. 1 By Mouth Once Daily 2)  Hydrochlorothiazide 25 Mg Tabs (Hydrochlorothiazide) .Marland Kitchen.. 1 By Mouth Once Daily 3)  Adult Aspirin Ec Low Strength 81 Mg Tbec (Aspirin) .Marland Kitchen.. 1po Once Daily 4)  Tramadol Hcl 50 Mg Tabs (Tramadol Hcl) .Marland Kitchen.. 1 By Mouth Q 6 Hrs As Needed Pain 5)  Fexofenadine Hcl 180 Mg Tabs (Fexofenadine Hcl)  .Marland Kitchen.. 1 By Mouth Once Daily As Needed Allergies 6)  Fluticasone Propionate 50 Mcg/act Susp (Fluticasone Propionate) .... 2 Spray/side Once Daily 7)  Nexium 40 Mg Cpdr (Esomeprazole Magnesium) .Marland Kitchen.. 1po Once Daily 8)  Citalopram Hydrobromide 10 Mg Tabs (Citalopram Hydrobromide) .Marland Kitchen.. 1 By Mouth Once Daily  Current Medications (verified): 1)  Norvasc 10 Mg Tabs (Amlodipine Besylate) .Marland Kitchen.. 1 By Mouth Once Daily 2)  Hydrochlorothiazide 25 Mg Tabs (Hydrochlorothiazide) .Marland Kitchen.. 1 By Mouth Once Daily 3)  Adult Aspirin Ec Low Strength 81 Mg Tbec (Aspirin) .Marland Kitchen.. 1po Once Daily 4)  Tramadol Hcl 50 Mg Tabs (Tramadol Hcl) .Marland Kitchen.. 1 By Mouth Q 6 Hrs As Needed Pain 5)  Fexofenadine Hcl 180 Mg Tabs (Fexofenadine Hcl) .Marland Kitchen.. 1 By Mouth Once Daily As Needed Allergies 6)  Fluticasone Propionate 50 Mcg/act Susp (Fluticasone Propionate) .... 2 Spray/side Once Daily 7)  Nexium 40 Mg Cpdr (Esomeprazole Magnesium) .Marland Kitchen.. 1po Once Daily 8)  Citalopram Hydrobromide 20 Mg Tabs (Citalopram Hydrobromide) .Marland Kitchen.. 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   Head:  normocephalic and atraumatic.   Eyes:  vision grossly intact, pupils equal, and pupils round.   Ears:  R ear normal and  L ear normal.   Nose:  no external deformity and no nasal discharge.   Mouth:  no gingival abnormalities and pharynx pink and moist.   Neck:  supple and no masses.   Lungs:  normal respiratory effort and normal breath sounds.   Heart:  normal rate and regular rhythm.   Abdomen:  soft and normal bowel sounds with epigastric tender.  but no guarding or rebound Msk:  no chest wall tender Extremities:  no edema, no erythema  Psych:  not depressed appearing and moderately anxious.     Impression & Recommendations:  Problem # 1:  EPIGASTRIC PAIN (ICD-789.06)  with nausea, worse to eat, recent lipase normal, assoc with 9 lb wt loss per pt - will check gastric empyting scan, and HIDA scan  Orders: Radiology Referral (Radiology) Radiology Referral (Radiology)  Problem # 2:  ANXIETY (ICD-300.00)  Her updated medication list for this problem includes:    Citalopram Hydrobromide 20 Mg Tabs (Citalopram hydrobromide) .Marland Kitchen... 1 by mouth once daily to incr to 20 mg, declines counseling  Problem # 3:  HYPERTENSION (ICD-401.9)  Her updated medication list for this problem includes:    Norvasc 10 Mg Tabs (Amlodipine besylate) .Marland Kitchen... 1 by mouth once daily    Hydrochlorothiazide 25 Mg Tabs (Hydrochlorothiazide) .Marland Kitchen... 1 by mouth once daily  BP today: 124/70 Prior BP: 124/84 (12/28/2009)  Labs Reviewed: K+: 3.8 (09/06/2009) Creat: : 0.9 (09/06/2009)   Chol: 217 (09/06/2009)   HDL: 110.70 (09/06/2009)   TG: 47.0 (09/06/2009) stable overall by hx and exam, ok to continue meds/tx as is   Complete Medication List: 1)  Norvasc 10 Mg Tabs (Amlodipine besylate) .Marland Kitchen.. 1 by mouth once daily 2)  Hydrochlorothiazide 25 Mg Tabs (Hydrochlorothiazide) .Marland Kitchen.. 1 by mouth once daily 3)  Adult Aspirin Ec Low Strength 81 Mg Tbec (Aspirin) .Marland Kitchen.. 1po once daily 4)  Tramadol Hcl 50 Mg Tabs (Tramadol hcl) .Marland Kitchen.. 1 by mouth q 6 hrs as needed pain 5)  Fexofenadine Hcl 180 Mg Tabs (Fexofenadine hcl) .Marland Kitchen.. 1 by mouth once daily  as needed allergies 6)  Fluticasone Propionate 50 Mcg/act Susp (Fluticasone propionate) .... 2 spray/side once daily 7)  Nexium 40 Mg Cpdr (Esomeprazole magnesium) .Marland Kitchen.. 1po once daily 8)  Citalopram Hydrobromide 20 Mg Tabs (Citalopram hydrobromide) .Marland Kitchen.. 1 by mouth once daily   Patient Instructions: 1)  You will be contacted about the referral(s) to: the stomach scan, and the Gallbladder scan tests 2)  Continue all previous medications as before this visit , except increase the citalopram to 20 mg 3)  please followup Mar 2012 with "medicare yearly exam" or sooner if needed Prescriptions: CITALOPRAM HYDROBROMIDE 20 MG TABS (CITALOPRAM HYDROBROMIDE) 1 by mouth once daily  #90 x 3   Entered and Authorized by:   Corwin Levins MD   Signed by:   Corwin Levins MD  on 01/09/2010   Method used:   Print then Give to Patient   RxID:   [id number]

## 2010-08-01 NOTE — Miscellaneous (Signed)
Summary: Orders Update  Clinical Lists Changes  Orders: Added new Test order of Carotid Duplex (Carotid Duplex) - Signed 

## 2010-08-01 NOTE — Procedures (Signed)
Summary: Upper Endoscopy  Patient: Briana Schroeder Note: All result statuses are Final unless otherwise noted.  Tests: (1) Upper Endoscopy (EGD)   EGD Upper Endoscopy       DONE     [facility]     [address]     [place], [place]  [address]           ENDOSCOPY PROCEDURE REPORT           PATIENT:  [name], [name]  MR#:  [id number]     BIRTHDATE:  02-Apr-1946, 64 yrs. old  GENDER:  female     ENDOSCOPIST:  Judie Petit T. Russella Dar, MD, [facility]           PROCEDURE DATE:  01/05/2010     PROCEDURE:  EGD, diagnostic     ASA CLASS:  Class II     INDICATIONS:  chest pain, GERD     MEDICATIONS:  Fentanyl 25 mcg IV, Versed 6 mg IV     TOPICAL ANESTHETIC:  Cetacaine Spray     DESCRIPTION OF PROCEDURE:   After the risks benefits and     alternatives of the procedure were thoroughly explained, informed     consent was obtained.  The [id number] ([id number]) endoscope was     introduced through the mouth and advanced to the second portion of     the duodenum, without limitations.  The instrument was slowly     withdrawn as the mucosa was fully examined.     <<PROCEDUREIMAGES>>     The esophagus and gastroesophageal junction were completely normal     in appearance.  The stomach was entered and closely examined. The     pylorus, antrum, angularis, and lesser curvature were well     visualized, including a retroflexed view of the cardia and fundus.     The stomach wall was normally distensable. The scope passed easily     through the pylorus into the duodenum.  The esophagus and     gastroesophageal junction were completely normal in appearance.     Retroflexed views revealed no abnormalities.  The scope was then     withdrawn from the patient and the procedure completed.           COMPLICATIONS:  None           ENDOSCOPIC IMPRESSION:     1) Normal EGD           RECOMMENDATIONS:     1) Anti-reflux regimen     2) PPI qam     3) Consider evaluation for other causes of chest pain  as GERD     alone is not likely the only factor involved           Lizette Pazos T. Russella Dar, MD, [name]           CC:  Corwin Levins, MD           n.     [name] Rylie Limburg at 01/05/2010 02:22 PM           [name], [phone]  Note: An exclamation mark (!) indicates a result that was not dispersed into the flowsheet. Document Creation Date: 01/05/2010 2:24 PM _______________________________________________________________________  (1) Order result status: Final Collection or observation date-time: 01/05/2010 14:02 Requested date-time:  Receipt date-time:  Reported date-time:  Referring Physician:   Ordering Physician: Claudette Head [id number]) Specimen Source:  Source: [name] Order Number: [id number] Lab site:

## 2010-08-01 NOTE — Progress Notes (Signed)
Summary: Nuc. Pre-Procedure  Phone Note Outgoing Call Call back at [facility] Phone [phone]   Call placed by: Irean Hong, RN,  December 22, 2009 2:56 PM Summary of Call: Reviewed information on Myoview Information Sheet (see scanned document for further details).  Spoke with patient.     Nuclear Med Background Indications for Stress Test: Evaluation for Ischemia    History Comments: Hx. Pulmonary Sarcoidosis.  Symptoms: Chest Pain, Chest Pressure, Diaphoresis, Dizziness, Fatigue, Nausea, SOB  Symptoms Comments: Radiates to both shoulders and tingling arms and legs.   Nuclear Pre-Procedure Cardiac Risk Factors: Hypertension, Lipids Height (in): 63

## 2010-08-01 NOTE — Consult Note (Signed)
Summary: [facility]  [facility]   Imported By: Sherian Rein 10/06/2009 14:46:32  _____________________________________________________________________  External Attachment:    Type:   Image     Comment:   External Document

## 2010-08-01 NOTE — Progress Notes (Signed)
Summary: triage  Phone Note Call from Patient Call back at Home Phone [phone]   Caller: [p Call For: Russella Dar Reason for Call: Talk to Nurse Summary of Call: Patient wants to know what to do states that she's having severe abd pain, nausea and wt loss. Initial call taken by: Tawni Levy,  January 18, 2010 8:09 AM  Follow-up for Phone Call        I spoke with the patient this am, her pain is the same as it was when she was inpatient no worse, she is scared mostly about her lack of appetite and weight loss and that she might have a stomach cancer.  Weight is stable since she has seen Dr Jonny Ruiz last week.  She reports she has a GES and a HIDA scan ordered for next week.  I have reviewed her chart with her  that she just was inpatient and had an endo and it was normal, and a colon with polyp removal in May.  I have asked her to wait to schedule an appointment until after the HIDA and GES next week.  She will call me back once she has the results and if they are normal we will work her into a schedule.  She says her epigastric pain is worse at night, I have asked her to increase her Nexium to two times a day and reviewed antireflux diet and measures with her..  She will call back if there is a change in her symptoms prior to the results of GES/HIDA next week.  Patient is agreeable to the plan.  Dr Leone Payor you are the MD of the day, please review and advise.   Follow-up by: Darcey Nora RN, CGRN,  January 18, 2010 8:41 AM  Additional Follow-up for Phone Call Additional follow up Details #1::        Agree with plan of care if she needs Nexium samples for two times a day you may provide if available Additional Follow-up by: Iva Boop MD, [name],  January 18, 2010 1:39 PM

## 2010-08-01 NOTE — Progress Notes (Signed)
Summary: SE?  Phone Note Call from Patient   Caller: Patient [phone] Summary of Call: pt called stating that she read the potential side effects with Nexium are Abd pain. Pt wants to know if she can D/C Nexium and go back to taking Omeprazole. Okay to switch? Initial call taken by: Margaret Pyle, CMA,  January 17, 2010 1:55 PM  Follow-up for Phone Call        no need to change  nexium does not cause abd pain  they only put that on the sheet so that they cannot be sued, and someone mentioned it during the study for the nexium Follow-up by: Corwin Levins MD,  January 17, 2010 4:13 PM  Additional Follow-up for Phone Call Additional follow up Details #1::        Patient notified. Additional Follow-up by: Lucious Groves CMA,  January 17, 2010 4:27 PM

## 2010-08-01 NOTE — Miscellaneous (Signed)
Summary: Previsit  Clinical Lists Changes  Medications: Added new medication of MOVIPREP 100 GM  SOLR (PEG-KCL-NACL-NASULF-NA ASC-C) As directed - Signed Rx of MOVIPREP 100 GM  SOLR (PEG-KCL-NACL-NASULF-NA ASC-C) As directed;  #1 x 0;  Signed;  Entered by: Clide Cliff RN;  Authorized by: Meryl Dare MD [name];  Method used: Electronically to [facility] [id number], [place], [place], [place]  [address], Ph: [phone], Fax: [phone]    Prescriptions: MOVIPREP 100 GM  SOLR (PEG-KCL-NACL-NASULF-NA ASC-C) As directed  #1 x 0   Entered by:   Clide Cliff RN   Authorized by:   Meryl Dare MD [facility]   Signed by:   Clide Cliff RN on 11/10/2009   Method used:   Electronically to        [facility] [address] (retail)       [address] [place], [place]  [address]       Ph: [phone]       Fax: [phone]   RxID:   [id number]

## 2010-08-01 NOTE — Assessment & Plan Note (Signed)
Summary: Cardiology Nuclear Study  Nuclear Med Background Indications for Stress Test: Evaluation for Ischemia    History Comments: Hx. Pulmonary Sarcoidosis.  Symptoms: Chest Pain, Chest Pressure, Diaphoresis, Dizziness, DOE, Fatigue, Nausea, SOB  Symptoms Comments: Radiates to both shoulders and tingling arms and legs.   Nuclear Pre-Procedure Cardiac Risk Factors: Hypertension, Lipids Caffeine/Decaff Intake: None NPO After: 8:30 PM Lungs: Clear IV 0.9% NS with Angio Cath: 20g     IV Site: (R) AC IV Started by: Irean Hong RN Chest Size (in) 34     Cup Size B     Height (in): 63 Weight (lb): 138 BMI: 24.53  Nuclear Med Study 1 or 2 day study:  1 day     Stress Test Type:  Stress Reading MD:  Marca Ancona, MD     Referring MD:  Oliver Barre, MD Resting Radionuclide:  Technetium 5m Tetrofosmin     Resting Radionuclide Dose:  11.0 mCi  Stress Radionuclide:  Technetium 41m Tetrofosmin     Stress Radionuclide Dose:  33.0 mCi   Stress Protocol Exercise Time (min):  7:15 min     Max HR:  169 bpm     Predicted Max HR:  155 bpm  Max Systolic BP: 184 mm Hg     Percent Max HR:  109.03 %     METS: 8.9 Rate Pressure Product:  [address]    Stress Test Technologist:  Rea [employer] CMA-N     Nuclear Technologist:  Domenic Polite CNMT  Rest Procedure  Myocardial perfusion imaging was performed at rest 45 minutes following the intravenous administration of Myoview Technetium 57m Tetrofosmin.  Stress Procedure  The patient exercised for 7:15.  The patient stopped due to fatigue and denied any chest pain.  There were no diagnostic ST-T wave changes, only occasional PVC' and PAC's.  Myoview was injected at peak exercise and myocardial perfusion imaging was performed after a brief delay.  QPS Raw Data Images:  Normal; no motion artifact; normal heart/lung ratio. Stress Images:  NI: Uniform and normal uptake of tracer in all myocardial segments. Rest Images:  Normal homogeneous uptake in all  areas of the myocardium. Subtraction (SDS):  There is no evidence of scar or ischemia. Transient Ischemic Dilatation:  1.03  (Normal <1.22)  Lung/Heart Ratio:  .34  (Normal <0.45)  Quantitative Gated Spect Images QGS EDV:  74 ml QGS ESV:  25 ml QGS EF:  67 % QGS cine images:  Normal wall motion.    Overall Impression  Exercise Capacity: Good exercise capacity. BP Response: Normal blood pressure response. Clinical Symptoms: Fatigue, no chest pain ECG Impression: No significant ST segment change suggestive of ischemia. Overall Impression: Normal stress nuclear study.  Appended Document: Cardiology Nuclear Study LMOPT - labs negative, normal, or stable  - No Acute problem

## 2010-08-01 NOTE — Assessment & Plan Note (Signed)
Summary: RECTAL PAIN/NWS   Vital Signs:  Patient profile:   65 year old female Height:      63 inches Weight:      139 pounds BMI:     24.71 O2 Sat:      98 % on Room air Temp:     97.8 degrees F oral Pulse rate:   66 / minute BP sitting:   114 / 72  (left arm) Cuff size:   regular  Vitals Entered ByZella Ball Ewing (August 02, 2009 1:40 PM)  O2 Flow:  Room air  CC: rectal pain/RE   CC:  rectal pain/RE.  History of Present Illness: here with 5 days gradually worsening pain  - rectal to start she says - now moderate, constant,  with intermittent radiation to the more distal RLE to below the knee;  no falls, trauma, fever, wt loss (except 5 bls recent iwth better diet recent); does have some night sweats but thinks she is going through menopause per GYN (just had pap smear per Gyn);  No left sided pain;  no change in bowel or bladder funciton.  no change in pain with BMs.      Problems Prior to Update: 1)  Arm Pain, Right  (ICD-729.5) 2)  Myalgia  (ICD-729.1) 3)  Sinusitis- Acute-nos  (ICD-461.9) 4)  Preventive Health Care  (ICD-V70.0) 5)  Pulmonary Sarcoidosis  (ICD-135) 6)  Colonic Polyps, Hx of  (ICD-V12.72) 7)  Ibs  (ICD-564.1) 8)  Anxiety  (ICD-300.00) 9)  Hypertension  (ICD-401.9) 10)  Hyperlipidemia  (ICD-272.4)  Medications Prior to Update: 1)  Norvasc 10 Mg Tabs (Amlodipine Besylate) .Marland Kitchen.. 1 By Mouth Once Daily 2)  Hydrochlorothiazide 25 Mg Tabs (Hydrochlorothiazide) .Marland Kitchen.. 1 By Mouth Once Daily 3)  Adult Aspirin Ec Low Strength 81 Mg Tbec (Aspirin) .Marland Kitchen.. 1po Once Daily 4)  Cephalexin 500 Mg Caps (Cephalexin) .Marland Kitchen.. 1 By Mouth Three Times A Day 5)  Naprosyn 500 Mg Tabs (Naproxen) .Marland Kitchen.. 1 By Mouth Two Times A Day As Needed Pain  Current Medications (verified): 1)  Norvasc 10 Mg Tabs (Amlodipine Besylate) .Marland Kitchen.. 1 By Mouth Once Daily 2)  Hydrochlorothiazide 25 Mg Tabs (Hydrochlorothiazide) .Marland Kitchen.. 1 By Mouth Once Daily 3)  Adult Aspirin Ec Low Strength 81 Mg Tbec (Aspirin)  .Marland Kitchen.. 1po Once Daily 4)  Cephalexin 500 Mg Caps (Cephalexin) .Marland Kitchen.. 1 By Mouth Three Times A Day 5)  Naprosyn 500 Mg Tabs (Naproxen) .Marland Kitchen.. 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   Head:  normocephalic and atraumatic.   Eyes:  vision grossly intact, pupils equal, and pupils round.   Ears:  R ear normal and L ear normal.   Nose:  no external deformity and no nasal discharge.   Mouth:  pharyngeal erythema and fair dentition.   Neck:  supple and no masses.   Lungs:  normal respiratory effort  and normal breath sounds.   Heart:  normal rate and regular rhythm.   Msk:  no spine or paraspinal tender Extremities:  no edema, no erythema  Neurologic:  cranial nerves II-XII intact, strength normal in all extremities, sensation intact to light touch, and DTRs symmetrical and normal.     Impression & Recommendations:  Problem # 1:  BACK PAIN (ICD-724.5)  Her updated medication list for this problem includes:    Adult Aspirin Ec Low Strength 81 Mg Tbec (Aspirin) .Marland Kitchen... 1po once daily    Tramadol Hcl 50 Mg Tabs (Tramadol hcl) .Marland Kitchen... 1 by mouth q 6 hrs as needed pain    Flexeril 5 Mg Tabs (Cyclobenzaprine hcl) .Marland Kitchen... 1 by mouth three times a day as needed muscle spasm low sacral , exam benign, ok for med tx at this time, but consider  ortho eval/ MRI for any persistent or worsening s/s  Problem # 2:  COLONIC POLYPS, HX OF (ICD-V12.72)  pt requests f/u colonoscopy - will refer  Orders: Gastroenterology Referral (GI)  Problem # 3:  ALLERGIC RHINITIS (ICD-477.9)  Her updated medication list for this problem includes:    Fexofenadine Hcl 180 Mg Tabs (Fexofenadine hcl) .Marland Kitchen... 1 by mouth once daily as needed allergies treat as above, f/u any worsening signs or symptoms   Complete Medication List: 1)  Norvasc 10 Mg Tabs (Amlodipine besylate) .Marland Kitchen.. 1 by mouth once daily 2)  Hydrochlorothiazide 25 Mg Tabs (Hydrochlorothiazide) .Marland Kitchen.. 1 by mouth once daily 3)  Adult Aspirin Ec Low Strength 81 Mg Tbec (Aspirin) .Marland Kitchen.. 1po once daily 4)  Tramadol Hcl 50 Mg Tabs (Tramadol hcl) .Marland Kitchen.. 1 by mouth q 6 hrs as needed pain 5)  Fexofenadine Hcl 180 Mg Tabs (Fexofenadine hcl) .Marland Kitchen.. 1 by mouth once daily as needed allergies 6)  Flexeril 5 Mg Tabs (Cyclobenzaprine hcl) .Marland Kitchen.. 1 by mouth three times a day as needed muscle spasm 7)  Prednisone 10 Mg Tabs (Prednisone) .... 3po qd for 3days, then 2po qd for 3days, then 1po qd for 3days, then stop  Patient Instructions: 1)  Please take all new medications as prescribed  2)  Continue all previous medications as before this visit  3)  Please call or return for pain that does not go away, or gets worse, as you may need MRI for the lower back  or orthopedic evaluation 4)  You will be contacted about the referral(s) to: Colonosocpy 5)  Please schedule a follow-up appointment in 4 months with CPX labs Prescriptions: PREDNISONE 10 MG TABS (PREDNISONE) 3po qd for 3days, then 2po qd for 3days, then 1po qd for 3days, then stop  #18 x 0   Entered and Authorized by:   Corwin Levins MD   Signed by:   Corwin Levins MD on 08/02/2009   Method used:   Print then Give to Patient   RxID:   [id number] FLEXERIL 5 MG TABS (CYCLOBENZAPRINE HCL) 1 by mouth three times a day as needed muscle spasm  #90 x 1   Entered  and Authorized by:   Corwin Levins MD   Signed by:   Corwin Levins MD on 08/02/2009   Method used:   Print then Give to Patient   RxID:   [id number] TRAMADOL HCL 50 MG TABS (TRAMADOL HCL) 1 by mouth q 6 hrs as needed pain  #60 x 1   Entered and Authorized by:   Corwin Levins MD   Signed by:   Corwin Levins MD  on 08/02/2009   Method used:   Print then Give to Patient   RxID:   [id number] FEXOFENADINE HCL 180 MG TABS (FEXOFENADINE HCL) 1 by mouth once daily as needed allergies  #30 x 11   Entered and Authorized by:   Corwin Levins MD   Signed by:   Corwin Levins MD on 08/02/2009   Method used:   Print then Give to Patient   RxID:   [id number]

## 2010-08-01 NOTE — Progress Notes (Signed)
  Phone Note Call from Patient   Caller: Patient Summary of Call: Patient called requesting refill on BP medication as was recently denied. Called pt to inform was filled  in March with refills and was the denial reason..Called pt. to inform. Patien informed  never filled prescription that was given to her in march, I informed patient we would take care of refilling for her today. Initial call taken by: Robin Ewing CMA (AAMA),  January 24, 2010 10:08 AM    Prescriptions: HYDROCHLOROTHIAZIDE 25 MG TABS (HYDROCHLOROTHIAZIDE) 1 by mouth once daily  #90 x 3   Entered by:   Scharlene Gloss CMA (AAMA)   Authorized by:   Corwin Levins MD   Signed by:   Scharlene Gloss CMA (AAMA) on 01/24/2010   Method used:   Electronically to        [facility] [address] (retail)       [address] [place], [place]  [address]       Ph: [phone]       Fax: [phone]   RxID:   [id number] NORVASC 10 MG TABS (AMLODIPINE BESYLATE) 1 by mouth once daily  #90 x 3   Entered by:   Scharlene Gloss CMA (AAMA)   Authorized by:   Corwin Levins MD   Signed by:   Scharlene Gloss CMA (AAMA) on 01/24/2010   Method used:   Electronically to        [facility] [address] (retail)       [address]       [place], [place]  [address]       Ph: [phone]       Fax: [phone]   RxID:   [id number]

## 2010-08-01 NOTE — Miscellaneous (Signed)
Summary: Orders Update   Clinical Lists Changes  Orders: Added new Service order of Est. Patient Level IV (99214) - Signed 

## 2010-08-01 NOTE — Procedures (Signed)
Summary: Colonoscopy  Patient: Briana Schroeder Note: All result statuses are Final unless otherwise noted.  Tests: (1) Colonoscopy (COL)   COL Colonoscopy           DONE     [facility]     [address]     [place], [place]  [address]           COLONOSCOPY PROCEDURE REPORT           PATIENT:  [name], [name]  MR#:  [id number]     BIRTHDATE:  03-04-1946, 64 yrs. old  GENDER:  female     ENDOSCOPIST:  Judie Petit T. Russella Dar, MD, [facility]           PROCEDURE DATE:  11/24/2009     PROCEDURE:  Colonoscopy with biopsy and snare polypectomy     ASA CLASS:  Class II     INDICATIONS:  1) follow-up of polyp  2) surveillance and high-risk     screening, adenomatous polyp, 10/2004.     MEDICATIONS:   Fentanyl 75 mcg IV, Versed 6 mg IV     DESCRIPTION OF PROCEDURE:   After the risks benefits and     alternatives of the procedure were thoroughly explained, informed     consent was obtained.  Digital rectal exam was performed and     revealed no abnormalities.   The LB [id number] endoscope     was introduced through the anus and advanced to the cecum, which     was identified by both the appendix and ileocecal valve, without     limitations.  The quality of the prep was excellent, using     MoviPrep.  The instrument was then slowly withdrawn as the colon     was fully examined.     <<PROCEDUREIMAGES>>     FINDINGS:  A sessile polyp was found in the mid transverse colon.     It was 4 mm in size. The polyp was removed using cold biopsy     forceps.  A sessile polyp was found in the sigmoid colon. It was 3     mm in size. The polyp was removed using cold biopsy forceps.  A     sessile polyp was found in the sigmoid colon. It was 4 mm in size.     Polyp was snared without cautery. Retrieval was successful. Mild     diverticulosis was found in the sigmoid colon.  A normal appearing     cecum, ileocecal valve, and appendiceal orifice were identified.     The ascending, hepatic flexure,  splenic flexure, descending, and     rectum appeared unremarkable. Retroflexed views in the rectum     revealed no abnormalities.  The time to cecum =  3.75  minutes.     The scope was then withdrawn (time =  10.5  min) from the patient     and the procedure completed.           COMPLICATIONS:  None           ENDOSCOPIC IMPRESSION:     1) 4 mm sessile polyp in the mid transverse colon     2) 3 mm sessile polyp in the sigmoid colon     3) 4 mm sessile polyp in the sigmoid colon     4) Mild diverticulosis in the sigmoid colon           RECOMMENDATIONS:     1) Await pathology results  2) High fiber diet with liberal fluid intake.     3) Repeat Colonoscopy in 5 years pending pathology review           Malcolm T. Russella Dar, MD, [name]           CC: Corwin Levins, MD           n.     [name] Stark at 11/24/2009 10:55 AM           [name], [phone]  Note: An exclamation mark (!) indicates a result that was not dispersed into the flowsheet. Document Creation Date: 11/24/2009 10:56 AM _______________________________________________________________________  (1) Order result status: Final Collection or observation date-time: 11/24/2009 10:49 Requested date-time:  Receipt date-time:  Reported date-time:  Referring Physician:   Ordering Physician: Claudette Head [phone]) Specimen Source:  Source: [name] Order Number: [id number] Lab site:   Appended Document: Colonoscopy     Procedures Next Due Date:    Colonoscopy: 10/2014

## 2010-09-17 LAB — CBC
HCT: 38.3 % (ref 36.0–46.0)
Hemoglobin: 13 g/dL (ref 12.0–15.0)
MCH: 30.9 pg (ref 26.0–34.0)
MCHC: 34 g/dL (ref 30.0–36.0)
MCV: 91 fL (ref 78.0–100.0)
MCV: 91.1 fL (ref 78.0–100.0)
Platelets: 210 10*3/uL (ref 150–400)
RBC: 4.21 MIL/uL (ref 3.87–5.11)
RBC: 4.21 MIL/uL (ref 3.87–5.11)
RDW: 14 % (ref 11.5–15.5)
RDW: 14.2 % (ref 11.5–15.5)
WBC: 4.7 10*3/uL (ref 4.0–10.5)
WBC: 5.6 10*3/uL (ref 4.0–10.5)

## 2010-09-17 LAB — CARDIAC PANEL(CRET KIN+CKTOT+MB+TROPI)
CK, MB: 10.9 ng/mL (ref 0.3–4.0)
CK, MB: 11 ng/mL (ref 0.3–4.0)
CK, MB: 14.7 ng/mL (ref 0.3–4.0)
CK, MB: 9 ng/mL (ref 0.3–4.0)
Relative Index: 1.7 (ref 0.0–2.5)
Relative Index: 1.7 (ref 0.0–2.5)
Relative Index: 1.8 (ref 0.0–2.5)
Total CK: 506 U/L — ABNORMAL HIGH (ref 7–177)
Total CK: 627 U/L — ABNORMAL HIGH (ref 7–177)
Total CK: 669 U/L — ABNORMAL HIGH (ref 7–177)
Total CK: 850 U/L — ABNORMAL HIGH (ref 7–177)
Troponin I: 0.02 ng/mL (ref 0.00–0.06)

## 2010-09-17 LAB — COMPREHENSIVE METABOLIC PANEL
ALT: 29 U/L (ref 0–35)
AST: 42 U/L — ABNORMAL HIGH (ref 0–37)
Albumin: 4 g/dL (ref 3.5–5.2)
Alkaline Phosphatase: 29 U/L — ABNORMAL LOW (ref 39–117)
Calcium: 9.5 mg/dL (ref 8.4–10.5)
Creatinine, Ser: 0.89 mg/dL (ref 0.4–1.2)
GFR calc Af Amer: >60 mL/min (ref >60)
Glucose, Bld: 114 mg/dL — ABNORMAL HIGH (ref 70–99)
Potassium: 3.4 mEq/L — ABNORMAL LOW (ref 3.5–5.1)
Total Bilirubin: 0.8 mg/dL (ref 0.3–1.2)
Total Protein: 7.3 g/dL (ref 6.0–8.3)

## 2010-09-17 LAB — COMPREHENSIVE METABOLIC PANEL WITH GFR
ALT: 27 U/L (ref 0–35)
AST: 40 U/L — ABNORMAL HIGH (ref 0–37)
Albumin: 3.8 g/dL (ref 3.5–5.2)
Alkaline Phosphatase: 25 U/L — ABNORMAL LOW (ref 39–117)
BUN: 17 mg/dL (ref 6–23)
CO2: 31 meq/L (ref 19–32)
Calcium: 9.2 mg/dL (ref 8.4–10.5)
Chloride: 97 meq/L (ref 96–112)
Creatinine, Ser: 0.93 mg/dL (ref 0.4–1.2)
GFR calc non Af Amer: >60 mL/min
Glucose, Bld: 109 mg/dL — ABNORMAL HIGH (ref 70–99)
Potassium: 3.8 meq/L (ref 3.5–5.1)
Sodium: 135 meq/L (ref 135–145)
Total Bilirubin: 0.8 mg/dL (ref 0.3–1.2)
Total Protein: 6.9 g/dL (ref 6.0–8.3)

## 2010-09-17 LAB — HEMOGLOBIN A1C
Hgb A1c MFr Bld: 6.4 % — ABNORMAL HIGH
Mean Plasma Glucose: 137 mg/dL — ABNORMAL HIGH

## 2010-09-17 LAB — RAPID URINE DRUG SCREEN, HOSP PERFORMED
Amphetamines: NOT DETECTED
Barbiturates: NOT DETECTED
Benzodiazepines: NOT DETECTED
Cocaine: NOT DETECTED
Opiates: NOT DETECTED
Tetrahydrocannabinol: NOT DETECTED

## 2010-09-17 LAB — DIFFERENTIAL
Basophils Absolute: 0 10*3/uL (ref 0.0–0.1)
Basophils Relative: 1 % (ref 0–1)
Eosinophils Absolute: 0.2 10*3/uL (ref 0.0–0.7)
Neutrophils Relative %: 58 % (ref 43–77)

## 2010-09-17 LAB — D-DIMER, QUANTITATIVE

## 2010-09-17 LAB — TSH: TSH: 1.18 u[IU]/mL (ref 0.350–4.500)

## 2010-09-17 LAB — TROPONIN I

## 2010-09-17 LAB — MAGNESIUM: Magnesium: 1.9 mg/dL (ref 1.5–2.5)

## 2010-09-17 LAB — POCT CARDIAC MARKERS
CKMB, poc: 18.6 ng/mL (ref 1.0–8.0)
Troponin i, poc: <0.05 ng/mL (ref 0.00–0.09)

## 2011-02-20 DIAGNOSIS — D869 Sarcoidosis, unspecified: Secondary | ICD-10-CM

## 2011-02-27 ENCOUNTER — Other Ambulatory Visit (INDEPENDENT_AMBULATORY_CARE_PROVIDER_SITE_OTHER): Payer: PRIVATE HEALTH INSURANCE

## 2011-02-27 ENCOUNTER — Other Ambulatory Visit: Payer: Self-pay | Admitting: Internal Medicine

## 2011-02-27 ENCOUNTER — Ambulatory Visit (INDEPENDENT_AMBULATORY_CARE_PROVIDER_SITE_OTHER): Payer: PRIVATE HEALTH INSURANCE | Admitting: Internal Medicine

## 2011-02-27 ENCOUNTER — Encounter: Payer: Self-pay | Admitting: Internal Medicine

## 2011-02-27 VITALS — BP 132/80 | HR 71 | Temp 98.7°F | Ht 63.0 in | Wt 148.0 lb

## 2011-02-27 DIAGNOSIS — E785 Hyperlipidemia, unspecified: Secondary | ICD-10-CM

## 2011-02-27 DIAGNOSIS — R5381 Other malaise: Secondary | ICD-10-CM

## 2011-02-27 DIAGNOSIS — R5383 Other fatigue: Secondary | ICD-10-CM

## 2011-02-27 DIAGNOSIS — I10 Essential (primary) hypertension: Secondary | ICD-10-CM

## 2011-02-27 DIAGNOSIS — J309 Allergic rhinitis, unspecified: Secondary | ICD-10-CM

## 2011-02-27 DIAGNOSIS — H903 Sensorineural hearing loss, bilateral: Secondary | ICD-10-CM

## 2011-02-27 DIAGNOSIS — F411 Generalized anxiety disorder: Secondary | ICD-10-CM

## 2011-02-27 LAB — URINALYSIS, ROUTINE W REFLEX MICROSCOPIC
Bilirubin Urine: NEGATIVE
Ketones, ur: NEGATIVE
Total Protein, Urine: NEGATIVE
pH: 5.5 (ref 5.0–8.0)

## 2011-02-27 LAB — CBC WITH DIFFERENTIAL/PLATELET
Basophils Relative: 0.5 % (ref 0.0–3.0)
Eosinophils Relative: 4.1 % (ref 0.0–5.0)
HCT: 40.8 % (ref 36.0–46.0)
Lymphs Abs: 1.5 10*3/uL (ref 0.7–4.0)
MCV: 91.4 fl (ref 78.0–100.0)
Monocytes Absolute: 0.3 10*3/uL (ref 0.1–1.0)
Neutro Abs: 2.6 10*3/uL (ref 1.4–7.7)
RBC: 4.47 Mil/uL (ref 3.87–5.11)
WBC: 4.7 10*3/uL (ref 4.5–10.5)

## 2011-02-27 LAB — BASIC METABOLIC PANEL
BUN: 26 mg/dL — ABNORMAL HIGH (ref 6–23)
Creatinine, Ser: 0.7 mg/dL (ref 0.4–1.2)
GFR: 115.09 mL/min (ref >60.00)
Potassium: 4.1 mEq/L (ref 3.5–5.1)

## 2011-02-27 LAB — HEPATIC FUNCTION PANEL
AST: 50 U/L — ABNORMAL HIGH (ref 0–37)
Total Bilirubin: 0.7 mg/dL (ref 0.3–1.2)

## 2011-02-27 LAB — LIPID PANEL
HDL: 101 mg/dL (ref >39.00)
Triglycerides: 37 mg/dL (ref 0.0–149.0)

## 2011-02-27 MED ORDER — AMLODIPINE BESYLATE 10 MG PO TABS: 10.0000 mg | ORAL_TABLET | Freq: Every day | ORAL | Status: DC

## 2011-02-27 MED ORDER — SERTRALINE HCL 50 MG PO TABS: 50.0000 mg | ORAL_TABLET | Freq: Every day | ORAL | Status: DC

## 2011-02-27 MED ORDER — HYDROCHLOROTHIAZIDE 25 MG PO TABS: 25.0000 mg | ORAL_TABLET | Freq: Every day | ORAL | Status: DC

## 2011-02-27 MED ORDER — FEXOFENADINE HCL 180 MG PO TABS: 180.0000 mg | ORAL_TABLET | Freq: Every day | ORAL | Status: DC

## 2011-02-27 MED ORDER — FLUTICASONE PROPIONATE 50 MCG/ACT NA SUSP: 2.0000 | Freq: Every day | NASAL | Status: DC

## 2011-02-27 NOTE — Assessment & Plan Note (Signed)
Improved s/p bilat was impaction irrigation

## 2011-02-27 NOTE — Patient Instructions (Addendum)
Please remember to followup with your GYN for the yearly pap smear and/or mammogram Please go to LAB in the Basement for the blood and/or urine tests to be done today Please call the phone number [phone] (the PhoneTree System) for results of testing in 2-3 days;  When calling, simply dial the number, and when prompted enter the MRN number above (the Medical Record Number) and the # key, then the message should start. Your ears were irrigated today Take all new medications as prescribed - the generic zoloft Continue all other medications as before, including the medications for allergies All of your medications were refilled to your pharmacy

## 2011-02-27 NOTE — Assessment & Plan Note (Signed)
Mild to mod, - for re-start meds,  to f/u any worsening symptoms or concerns

## 2011-02-27 NOTE — Progress Notes (Signed)
Subjective:    Patient ID: Briana Schroeder, female    DOB: [birth year], 65 y.o.   MRN: [id number]  HPI  Here to f/u; overall doing ok,  Pt denies chest pain, increased sob or doe, wheezing, orthopnea, PND, increased LE swelling, palpitations, dizziness or syncope.  Pt denies new neurological symptoms such as new headache, or facial or extremity weakness or numbness   Pt denies polydipsia, polyuria, Pt states overall good compliance with meds, trying to follow lower cholesterol,  diet, wt overall stable (gained just a few lbs) but little exercise however (but plans to be more active.  Denies worsening depressive symptoms, suicidal ideation, or panic, though has ongoing anxiety, not increased recently, though she has had hot flashes at night and  Gets "whole body" tingling and veins swollen big to the hands on occasion (though not now),  No clear CTS symptoms.  Not taking the celexa and nexium at this time.  Does have several wks ongoing nasal allergy symptoms with clear congestion, itch and sneeze, without fever, pain, ST, cough or wheezing, and is not taking her allegra and flonase that worked before.  Does have sense of ongoing fatigue, but denies signficant hypersomnolence. Past Medical History  Diagnosis Date  . ALLERGIC RHINITIS 08/02/2009  . ANXIETY 10/18/2008  . ARM PAIN, RIGHT 04/04/2009  . BACK PAIN 08/02/2009  . CHEST PAIN 12/05/2009  . COLONIC POLYPS, HX OF 10/18/2008  . DIZZINESS 12/05/2009  . EPIGASTRIC PAIN 01/09/2010  . FATIGUE 09/06/2009  . GERD 12/06/2009  . HOARSENESS 09/06/2009  . HYPERLIPIDEMIA 10/18/2008  . HYPERTENSION 10/18/2008  . IBS 10/18/2008  . MYALGIA 03/21/2009  . PULMONARY SARCOIDOSIS 10/18/2008  . SINUSITIS- ACUTE-NOS 03/21/2009  . VOCAL CORD NODULE 09/06/2009   Past Surgical History  Procedure Date  . Abdominal hysterectomy   . S/p right finger tendon surgury   . Hx of vocal cord nodules     reports that she has never smoked. She does not have any smokeless tobacco history on  file. She reports that she does not drink alcohol. Her drug history not on file. family history includes Arthritis in her other; Cancer in her other; Diabetes in her other; and Hypertension in her other. No Known Allergies Current Outpatient Prescriptions on File Prior to Visit  Medication Sig Dispense Refill  . amLODipine (NORVASC) 10 MG tablet Take 10 mg by mouth daily.        Marland Kitchen aspirin 81 MG tablet Take 81 mg by mouth daily.        . fexofenadine (ALLEGRA) 180 MG tablet Take 180 mg by mouth daily.        . fluticasone (FLONASE) 50 MCG/ACT nasal spray Place 2 sprays into the nose daily.        . hydrochlorothiazide 25 MG tablet Take 25 mg by mouth daily.        . traMADol (ULTRAM) 50 MG tablet Take 50 mg by mouth every 6 (six) hours as needed.         Review of Systems Review of Systems  Constitutional: Negative for diaphoresis and unexpected weight change.  HENT: Negative for drooling and tinnitus.   Eyes: Negative for photophobia and visual disturbance.  Respiratory: Negative for choking and stridor.   Gastrointestinal: Negative for vomiting and blood in stool.  Genitourinary: Negative for hematuria and decreased urine volume.  Musculoskeletal: Negative for gait problem.  Skin: Negative for color change and wound.  Neurological: Negative for tremors and numbness  Has had bilat hearing difficulty  in the past wk - ? Wax related.  Psychiatric/Behavioral: Negative for decreased concentration. The patient is not hyperactive.       Objective:   Physical Exam BP 132/80  Pulse 71  Temp(Src) 98.7 F (37.1 C) (Oral)  Ht 5\' 3"  (1.6 m)  Wt 148 lb (67.132 kg)  BMI 26.22 kg/m2  SpO2 97% Physical Exam  VS noted, not ill appaering Constitutional: Pt appears well-developed and well-nourished.  HENT: Head: Normocephalic.  Right Ear: External ear normal.  Left Ear: External ear normal. Bilat tm'S OK after wax impactions irrigated bialt Eyes: Conjunctivae and EOM are normal. Pupils are  equal, round, and reactive to light.  Neck: Normal range of motion. Neck supple.  Cardiovascular: Normal rate and regular rhythm.   Pulmonary/Chest: Effort normal and breath sounds normal.  Abd:  Soft, NT, non-distended, + BS Neurological: Pt is alert. No cranial nerve deficit.  Skin: Skin is warm. No erythema.  Psychiatric: Pt behavior is normal. Thought content normal. 1-2+ nervous        Assessment & Plan:

## 2011-02-27 NOTE — Assessment & Plan Note (Signed)
Etiology unclear, Exam otherwise benign, to check labs as documented, follow with expectant management, ecg reveiwed - sinus without acute change

## 2011-02-27 NOTE — Assessment & Plan Note (Signed)
D/w pt - ok for zoloft 50 qd,  to f/u any worsening symptoms or concerns

## 2011-02-27 NOTE — Assessment & Plan Note (Signed)
stable overall by hx and exam, most recent data reviewed with pt, and pt to continue medical treatment as before  BP Readings from Last 3 Encounters:  02/27/11 132/80  01/09/10 124/70  12/28/09 124/84

## 2011-03-06 ENCOUNTER — Telehealth: Payer: Self-pay

## 2011-03-06 NOTE — Telephone Encounter (Signed)
Patient called requesting alternative to Zoloft as has had a reaction and cannot take. Please advise. Call back number is [phone]

## 2011-03-06 NOTE — Telephone Encounter (Signed)
Called patient; left message to call back.

## 2011-03-06 NOTE — Telephone Encounter (Signed)
What kind of reaction or symptoms does she believe caused by the med?

## 2011-03-07 MED ORDER — ESCITALOPRAM OXALATE 10 MG PO TABS: 10.0000 mg | ORAL_TABLET | Freq: Every day | ORAL | Status: AC

## 2011-03-07 NOTE — Telephone Encounter (Signed)
Ok to change to [employer] - done per emr  (no diarrhea or dizzy with this one)

## 2011-03-07 NOTE — Telephone Encounter (Signed)
Called the patient back and she took one pill, then became dizzy, had diarrhea and loss of appetite. She is requesting the medication be put on her allergy list and an alternative to Zoloft.

## 2011-03-07 NOTE — Telephone Encounter (Signed)
Informed patient

## 2011-03-09 ENCOUNTER — Telehealth: Payer: Self-pay

## 2011-03-09 NOTE — Telephone Encounter (Signed)
Patient called requesting samples of Lexapro 10 mg as the cost for her prescription for that medication is $88.00. Informed we have no samples, the patient said she would pickup prescription at pharmacy. She is concerned of having a reaction to this medication and not being able to take it. I informed the patient Dr. Jonny Ruiz documented she should have no diarrhea or dizziness from lexapro.

## 2011-05-01 DIAGNOSIS — D219 Benign neoplasm of connective and other soft tissue, unspecified: Secondary | ICD-10-CM | POA: Insufficient documentation

## 2011-05-01 DIAGNOSIS — M8580 Other specified disorders of bone density and structure, unspecified site: Secondary | ICD-10-CM | POA: Insufficient documentation

## 2011-05-03 ENCOUNTER — Encounter: Payer: PRIVATE HEALTH INSURANCE | Admitting: Women's Health

## 2011-05-10 ENCOUNTER — Encounter: Payer: Self-pay | Admitting: Women's Health

## 2011-05-10 ENCOUNTER — Ambulatory Visit (INDEPENDENT_AMBULATORY_CARE_PROVIDER_SITE_OTHER): Payer: Medicare Other | Admitting: Women's Health

## 2011-05-10 VITALS — BP 124/70 | Ht 63.0 in | Wt 145.0 lb

## 2011-05-10 DIAGNOSIS — N952 Postmenopausal atrophic vaginitis: Secondary | ICD-10-CM

## 2011-05-10 DIAGNOSIS — M8580 Other specified disorders of bone density and structure, unspecified site: Secondary | ICD-10-CM

## 2011-05-10 DIAGNOSIS — M949 Disorder of cartilage, unspecified: Secondary | ICD-10-CM

## 2011-05-10 NOTE — Progress Notes (Signed)
Briana Schroeder Sep 28, 1945 [id number]    History:    The patient presents for breast  exam.  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   Past medical history, past surgical history, family history and social history were all reviewed and documented in the EPIC chart.   ROS:  A  ROS was performed and pertinent positives and negatives are included in the history.  Exam:  Filed Vitals:   05/10/11 0958  BP: 124/70    General appearance:  Normal Head/Neck:  Normal, without cervical or supraclavicular adenopathy. Thyroid:  Symmetrical, normal in size, without palpable masses or nodularity. Respiratory  Effort:  Normal  Auscultation:  Clear without wheezing or rhonchi Cardiovascular  Auscultation:  Regular rate, without rubs, murmurs or gallops  Edema/varicosities:  Not grossly evident Abdominal  Soft,nontender, without masses, guarding or rebound.  Liver/spleen:  No organomegaly noted  Hernia:  None appreciated  Skin  Inspection:  Grossly normal  Palpation:  Grossly normal Neurologic/psychiatric  Orientation:  Normal with appropriate conversation.  Mood/affect:  Normal  Genitourinary    Breasts: Examined lying and sitting.     Right: Without masses, retractions, discharge or axillary adenopathy.     Left: Without masses, retractions, discharge or axillary adenopathy.   Inguinal/mons:  Normal without inguinal adenopathy  External genitalia:  Normal  BUS/Urethra/Skene's glands:  Normal  Bladder:  Normal  Vagina:  Normal  Cervix:  absent  Uterus:  absent  Adnexa/parametria:     Rt: Without masses or tenderness.   Lt: Without masses or tenderness.  Anus and perineum: Normal  Digital rectal exam: Normal sphincter tone without palpated masses or tenderness  Assessment/Plan:  65 y.o.MBF G2P2  for exam with no complaints. History of a hysterectomy for fibroids and menorrhagia in 89. No ERT. History of all normal Paps. Last mammogram was in 09, normal  did review importance of annual screenings. History of mother with breast cancer, age 62 and doing well. Had a colonoscopy with a negative polyp 3 years ago. Zostovac's given  at primary care.  Osteopenia,TScore  -1.5 at hip-no meds Hypertensive/ primary care meds and labs.  Plan: Repeat DEXA 11/ 2013. Home safety, fall prevention reviewed, importance of exercise, calcium rich diet, vitamin D 2000 daily encouraged. SBEs, annual mammogram will schedule. Encouraged vaginal lubricants with intercourse. Continue care primary care for hypertension.  Harrington Challenger [facility], 10:46 AM 05/10/2011

## 2011-05-10 NOTE — Patient Instructions (Signed)
Vit D 2000 daily  Walk daily  Have FUN and delegate

## 2011-05-15 ENCOUNTER — Encounter: Payer: Self-pay | Admitting: Internal Medicine

## 2011-05-15 ENCOUNTER — Telehealth: Payer: Self-pay

## 2011-05-15 ENCOUNTER — Ambulatory Visit (INDEPENDENT_AMBULATORY_CARE_PROVIDER_SITE_OTHER): Payer: Medicare Other | Admitting: Internal Medicine

## 2011-05-15 VITALS — BP 110/70 | HR 68 | Temp 99.1°F | Ht 64.0 in | Wt 147.1 lb

## 2011-05-15 DIAGNOSIS — M25519 Pain in unspecified shoulder: Secondary | ICD-10-CM

## 2011-05-15 DIAGNOSIS — M25512 Pain in left shoulder: Secondary | ICD-10-CM | POA: Insufficient documentation

## 2011-05-15 DIAGNOSIS — I10 Essential (primary) hypertension: Secondary | ICD-10-CM

## 2011-05-15 DIAGNOSIS — Z0000 Encounter for general adult medical examination without abnormal findings: Secondary | ICD-10-CM | POA: Insufficient documentation

## 2011-05-15 DIAGNOSIS — F411 Generalized anxiety disorder: Secondary | ICD-10-CM

## 2011-05-15 MED ORDER — MELOXICAM 15 MG PO TABS: 15.0000 mg | ORAL_TABLET | Freq: Every day | ORAL | Status: AC

## 2011-05-15 NOTE — Assessment & Plan Note (Signed)
Hx and exam c/w prob rotater cuff disease I suspect, for nsaid prn, and refer ortho,  to f/u any worsening symptoms or concerns

## 2011-05-15 NOTE — Progress Notes (Signed)
Subjective:    Patient ID: Briana Schroeder, female    DOB: 1946/02/21, 65 y.o.   MRN: [id number]  HPI Here to f/u - c/o left shoulder pain x 1 mo, sharp, tender to move and has mild decreased ROM to forward elev adn abduction, also with some tingling radiation toward the distal UE, but no neck pain, or distal UE sensation loss or weakness. Pt denies chest pain, increased sob or doe, wheezing, orthopnea, PND, increased LE swelling, palpitations, dizziness or syncope.  Pt denies new neurological symptoms such as new headache, or facial or extremity weakness or numbness except for the above.   Pt denies polydipsia, polyuria.     Pt denies fever, wt loss, night sweats, loss of appetite, or other constitutional symptoms  Denies worsening depressive symptoms, suicidal ideation, or panic, though has ongoing anxiety, not increased recently.  Past Medical History  Diagnosis Date  . ALLERGIC RHINITIS 08/02/2009  . ANXIETY 10/18/2008  . ARM PAIN, RIGHT 04/04/2009  . BACK PAIN 08/02/2009  . CHEST PAIN 12/05/2009  . COLONIC POLYPS, HX OF 10/18/2008  . DIZZINESS 12/05/2009  . EPIGASTRIC PAIN 01/09/2010  . FATIGUE 09/06/2009  . GERD 12/06/2009  . HOARSENESS 09/06/2009  . HYPERLIPIDEMIA 10/18/2008  . IBS 10/18/2008  . MYALGIA 03/21/2009  . PULMONARY SARCOIDOSIS 10/18/2008  . SINUSITIS- ACUTE-NOS 03/21/2009  . VOCAL CORD NODULE 09/06/2009  . HYPERTENSION 10/18/2008  . Osteopenia     -1.06 Jul 2009  . Fibroid    Past Surgical History  Procedure Date  . S/p right finger tendon surgury   . Hx of vocal cord nodules   . Abdominal hysterectomy 08/1992    TAH-DR G FOR FIBROIDS AND MENORRHAGIA    reports that she has never smoked. She does not have any smokeless tobacco history on file. She reports that she does not drink alcohol. Her drug history not on file. family history includes Breast cancer in her mother; Diabetes in her mother; and Hypertension in her mother and sister. Allergies  Allergen Reactions  . Zoloft Diarrhea    Current Outpatient Prescriptions on File Prior to Visit  Medication Sig Dispense Refill  . amLODipine (NORVASC) 10 MG tablet Take 1 tablet (10 mg total) by mouth daily.  90 tablet  3  . aspirin 81 MG tablet Take 81 mg by mouth daily.        Marland Kitchen escitalopram (LEXAPRO) 10 MG tablet Take 1 tablet (10 mg total) by mouth daily.  30 tablet  11  . fexofenadine (ALLEGRA) 180 MG tablet Take 1 tablet (180 mg total) by mouth daily.  90 tablet  3  . fluticasone (FLONASE) 50 MCG/ACT nasal spray Place 2 sprays into the nose daily.  16 g  3  . hydrochlorothiazide 25 MG tablet Take 1 tablet (25 mg total) by mouth daily.  90 tablet  3  . traMADol (ULTRAM) 50 MG tablet Take 50 mg by mouth every 6 (six) hours as needed.         Review of Systems Review of Systems  Constitutional: Negative for diaphoresis and unexpected weight change.  HENT: Negative for drooling and tinnitus.   Eyes: Negative for photophobia and visual disturbance.  Respiratory: Negative for choking and stridor.   Gastrointestinal: Negative for vomiting and blood in stool.  Genitourinary: Negative for hematuria and decreased urine volume.  Musculoskeletal: Negative for gait problem.  Skin: Negative for color change and wound.    Objective:   Physical Exam BP 110/70  Pulse 68  Temp(Src)  99.1 F (37.3 C) (Oral)  Ht 5\' 4"  (1.626 m)  Wt 147 lb 2 oz (66.735 kg)  BMI 25.25 kg/m2  SpO2 98% Physical Exam  VS noted Constitutional: Pt appears well-developed and well-nourished.  HENT: Head: Normocephalic.  Right Ear: External ear normal.  Left Ear: External ear normal.  Eyes: Conjunctivae and EOM are normal. Pupils are equal, round, and reactive to light.  Neck: Normal range of motion. Neck supple.  Cardiovascular: Normal rate and regular rhythm.   Pulmonary/Chest: Effort normal and breath sounds normal.  Left shoulder mild diffuse tender, with decreasesd ROM to forw elev/abduction to 110 degrees Neurological: Pt is alert. No cranial  nerve deficit. UE motor/sens/reflex intact Skin: Skin is warm. No erythema.  Psychiatric: Pt behavior is normal. Thought content normal. 1+ nervous    Assessment & Plan:

## 2011-05-15 NOTE — Telephone Encounter (Signed)
Message copied by Pincus Sanes on Tue May 15, 2011  4:17 PM ------      Message from: Corwin Levins      Created: Tue May 15, 2011 11:48 AM       Ok to follow for now            ----- Message -----         From: Scharlene Gloss         Sent: 05/15/2011   9:37 AM           To: Oliver Barre, MD            The patient meant to mention to you her left big toenail has come completely off twice in the past year and then it grows back.  There is no pain involved.

## 2011-05-15 NOTE — Assessment & Plan Note (Signed)
stable overall by hx and exam, most recent data reviewed with pt, and pt to continue medical treatment as before  Lab Results  Component Value Date   WBC 4.7 02/27/2011   HGB 13.7 02/27/2011   HCT 40.8 02/27/2011   PLT 252.0 02/27/2011   GLUCOSE 108* 02/27/2011   CHOL 223* 02/27/2011   TRIG 37.0 02/27/2011   HDL 101.00 02/27/2011   LDLDIRECT 111.3 02/27/2011   LDLCALC  Value: 105        Total Cholesterol/HDL:CHD Risk Coronary Heart Disease Risk Table                     Men   Women  1/2 Average Risk   3.4   3.3  Average Risk       5.0   4.4  2 X Average Risk   9.6   7.1  3 X Average Risk  23.4   11.0        Use the calculated Patient Ratio above and the CHD Risk Table to determine the patient's CHD Risk.        ATP III CLASSIFICATION (LDL):  <100     mg/dL   Optimal  540-981  mg/dL   Near or Above                    Optimal  130-159  mg/dL   Borderline  191-478  mg/dL   High  >295     mg/dL   Very High* 12/02/1306   ALT 45* 02/27/2011   AST 50* 02/27/2011   NA 141 02/27/2011   K 4.1 02/27/2011   CL 102 02/27/2011   CREATININE 0.7 02/27/2011   BUN 26* 02/27/2011   CO2 29 02/27/2011   TSH 0.81 02/27/2011   HGBA1C  Value: 6.4 (NOTE)                                                                       According to the ADA Clinical Practice Recommendations for 2011, when HbA1c is used as a screening test:   >=6.5%   Diagnostic of Diabetes Mellitus           (if abnormal result  is confirmed)  5.7-6.4%   Increased risk of developing Diabetes Mellitus  References:Diagnosis and Classification of Diabetes Mellitus,Diabetes Care,2011,34(Suppl 1):S62-S69 and Standards of Medical Care in         Diabetes - 2011,Diabetes Care,2011,34  (Suppl 1):S11-S61.* 01/05/2010

## 2011-05-15 NOTE — Telephone Encounter (Signed)
Called the patient informed of MD's instructions. 

## 2011-05-15 NOTE — Assessment & Plan Note (Signed)
stable overall by hx and exam, most recent data reviewed with pt, and pt to continue medical treatment as before  BP Readings from Last 3 Encounters:  05/15/11 110/70  05/10/11 124/70  02/27/11 132/80

## 2011-05-15 NOTE — Patient Instructions (Addendum)
Take all new medications as prescribed Continue all other medications as before You will be contacted regarding the referral for: orthopedic 

## 2011-07-13 ENCOUNTER — Ambulatory Visit (INDEPENDENT_AMBULATORY_CARE_PROVIDER_SITE_OTHER): Payer: Medicare Other | Admitting: Internal Medicine

## 2011-07-13 VITALS — BP 138/86 | HR 59 | Temp 98.3°F | Wt 144.0 lb

## 2011-07-13 DIAGNOSIS — I10 Essential (primary) hypertension: Secondary | ICD-10-CM

## 2011-07-13 DIAGNOSIS — J019 Acute sinusitis, unspecified: Secondary | ICD-10-CM | POA: Diagnosis not present

## 2011-07-13 DIAGNOSIS — M545 Low back pain: Secondary | ICD-10-CM | POA: Diagnosis not present

## 2011-07-13 LAB — POCT URINALYSIS DIPSTICK
Bilirubin, UA: NEGATIVE
Glucose, UA: NEGATIVE
Ketones, UA: NEGATIVE
Spec Grav, UA: 1.02
Urobilinogen, UA: 0.2

## 2011-07-13 MED ORDER — CYCLOBENZAPRINE HCL 5 MG PO TABS: 5.0000 mg | ORAL_TABLET | Freq: Three times a day (TID) | ORAL | Status: AC | PRN

## 2011-07-13 MED ORDER — PREDNISONE 10 MG PO TABS: 10.0000 mg | ORAL_TABLET | Freq: Every day | ORAL | Status: DC

## 2011-07-13 MED ORDER — LEVOFLOXACIN 250 MG PO TABS: 250.0000 mg | ORAL_TABLET | Freq: Every day | ORAL | Status: AC

## 2011-07-13 MED ORDER — TRAMADOL HCL 50 MG PO TABS: 50.0000 mg | ORAL_TABLET | Freq: Four times a day (QID) | ORAL | Status: DC | PRN

## 2011-07-13 NOTE — Assessment & Plan Note (Signed)
For udip in the office today, but if neg suspect lumbar strain vs underlyind DJD/DDD flare - hold on films, but tx with pain med, flexeril prn, and short course low dose predpack asd, exam o/w benign,  to f/u any worsening symptoms or concerns

## 2011-07-13 NOTE — Patient Instructions (Addendum)
Take all new medications as prescribed Continue all other medications as before Your urine test in the office today was ok Please return in 6 months, or sooner if needed

## 2011-07-14 ENCOUNTER — Encounter: Payer: Self-pay | Admitting: Internal Medicine

## 2011-07-14 NOTE — Assessment & Plan Note (Signed)
Mild to mod, for antibx course,  to f/u any worsening symptoms or concerns 

## 2011-07-14 NOTE — Progress Notes (Signed)
Subjective:    Patient ID: Briana Schroeder, female    DOB: [birth year], 66 y.o.   MRN: [id number]  HPI   Here with 3 days acute onset fever, facial pain, pressure, general weakness and malaise, and greenish d/c, with slight ST, but little to no cough and Pt denies chest pain, increased sob or doe, wheezing, orthopnea, PND, increased LE swelling, palpitations, dizziness or syncope.  Pt denies new neurological symptoms such as new headache, or facial or extremity weakness or numbness   Pt denies polydipsia, polyuria.  Pt also with new onset 2 wks LBP without change in severity, bowel or bladder change, fever, wt loss,  worsening LE pain/numbness/weakness, gait change or falls except now moderate pain with pain and tingling worse to both legs to lie down at night, cannot sleep, lying on side and icy hot no help. Past Medical History  Diagnosis Date  . ALLERGIC RHINITIS 08/02/2009  . ANXIETY 10/18/2008  . ARM PAIN, RIGHT 04/04/2009  . BACK PAIN 08/02/2009  . CHEST PAIN 12/05/2009  . COLONIC POLYPS, HX OF 10/18/2008  . DIZZINESS 12/05/2009  . EPIGASTRIC PAIN 01/09/2010  . FATIGUE 09/06/2009  . GERD 12/06/2009  . HOARSENESS 09/06/2009  . HYPERLIPIDEMIA 10/18/2008  . IBS 10/18/2008  . MYALGIA 03/21/2009  . PULMONARY SARCOIDOSIS 10/18/2008  . SINUSITIS- ACUTE-NOS 03/21/2009  . VOCAL CORD NODULE 09/06/2009  . HYPERTENSION 10/18/2008  . Osteopenia     -1.06 Jul 2009  . Fibroid    Past Surgical History  Procedure Date  . S/p right finger tendon surgury   . Hx of vocal cord nodules   . Abdominal hysterectomy 08/1992    TAH-DR G FOR FIBROIDS AND MENORRHAGIA    reports that she has never smoked. She does not have any smokeless tobacco history on file. She reports that she does not drink alcohol. Her drug history not on file. family history includes Breast cancer in her mother; Diabetes in her mother; and Hypertension in her mother and sister. Allergies  Allergen Reactions  . Zoloft Diarrhea   Current Outpatient  Prescriptions on File Prior to Visit  Medication Sig Dispense Refill  . amLODipine (NORVASC) 10 MG tablet Take 1 tablet (10 mg total) by mouth daily.  90 tablet  3  . aspirin 81 MG tablet Take 81 mg by mouth daily.        . fexofenadine (ALLEGRA) 180 MG tablet Take 1 tablet (180 mg total) by mouth daily.  90 tablet  3  . fluticasone (FLONASE) 50 MCG/ACT nasal spray Place 2 sprays into the nose daily.  16 g  3  . hydrochlorothiazide 25 MG tablet Take 1 tablet (25 mg total) by mouth daily.  90 tablet  3  . meloxicam (MOBIC) 15 MG tablet Take 1 tablet (15 mg total) by mouth daily.  30 tablet  2   Review of Systems Review of Systems  Constitutional: Negative for diaphoresis and unexpected weight change.  HENT: Negative for drooling and tinnitus.   Eyes: Negative for photophobia and visual disturbance.  Respiratory: Negative for choking and stridor.   Gastrointestinal: Negative for vomiting and blood in stool.  Genitourinary: Negative for hematuria and decreased urine volume.  Musculoskeletal: Negative for gait problem.    Objective:   Physical Exam BP 138/86  Pulse 59  Temp(Src) 98.3 F (36.8 C) (Oral)  Wt 144 lb (65.318 kg)  SpO2 98% Physical Exam  VS noted, mild ill Constitutional: Pt appears well-developed and well-nourished.  HENT: Head: Normocephalic.  Right  Ear: External ear normal.  Left Ear: External ear normal.  Bilat tm's mild erythema.  Sinus tender bilat.  Pharynx mild erythema Eyes: Conjunctivae and EOM are normal. Pupils are equal, round, and reactive to light.  Neck: Normal range of motion. Neck supple.  Cardiovascular: Normal rate and regular rhythm.   Pulmonary/Chest: Effort normal and breath sounds normal.  Abd:  Soft, NT, non-distended, + BS Neurological: Pt is alert. No cranial nerve deficit.  motor/sens/dtr/gait intact Skin: Skin is warm. No erythema.  Psychiatric: Pt behavior is normal. Thought content normal.     Assessment & Plan:

## 2011-07-14 NOTE — Assessment & Plan Note (Signed)
stable overall by hx and exam, most recent data reviewed with pt, and pt to continue medical treatment as before  BP Readings from Last 3 Encounters:  07/13/11 138/86  05/15/11 110/70  05/10/11 124/70

## 2011-09-04 ENCOUNTER — Ambulatory Visit: Payer: Medicare Other | Admitting: Internal Medicine

## 2012-03-04 ENCOUNTER — Encounter: Payer: Medicare Other | Admitting: Internal Medicine

## 2012-03-04 DIAGNOSIS — Z0289 Encounter for other administrative examinations: Secondary | ICD-10-CM

## 2012-05-19 ENCOUNTER — Other Ambulatory Visit (INDEPENDENT_AMBULATORY_CARE_PROVIDER_SITE_OTHER): Payer: PRIVATE HEALTH INSURANCE

## 2012-05-19 ENCOUNTER — Encounter: Payer: Self-pay | Admitting: Internal Medicine

## 2012-05-19 ENCOUNTER — Ambulatory Visit (INDEPENDENT_AMBULATORY_CARE_PROVIDER_SITE_OTHER): Payer: Medicare Other | Admitting: Internal Medicine

## 2012-05-19 VITALS — BP 118/70 | HR 66 | Temp 97.6°F | Ht 63.0 in | Wt 136.2 lb

## 2012-05-19 DIAGNOSIS — H9191 Unspecified hearing loss, right ear: Secondary | ICD-10-CM

## 2012-05-19 DIAGNOSIS — R209 Unspecified disturbances of skin sensation: Secondary | ICD-10-CM

## 2012-05-19 DIAGNOSIS — I10 Essential (primary) hypertension: Secondary | ICD-10-CM

## 2012-05-19 DIAGNOSIS — B179 Acute viral hepatitis, unspecified: Secondary | ICD-10-CM

## 2012-05-19 DIAGNOSIS — R7989 Other specified abnormal findings of blood chemistry: Secondary | ICD-10-CM

## 2012-05-19 DIAGNOSIS — H9190 Unspecified hearing loss, unspecified ear: Secondary | ICD-10-CM

## 2012-05-19 DIAGNOSIS — Z136 Encounter for screening for cardiovascular disorders: Secondary | ICD-10-CM | POA: Diagnosis not present

## 2012-05-19 DIAGNOSIS — K7200 Acute and subacute hepatic failure without coma: Secondary | ICD-10-CM

## 2012-05-19 DIAGNOSIS — R202 Paresthesia of skin: Secondary | ICD-10-CM | POA: Insufficient documentation

## 2012-05-19 DIAGNOSIS — R945 Abnormal results of liver function studies: Secondary | ICD-10-CM | POA: Insufficient documentation

## 2012-05-19 DIAGNOSIS — H9193 Unspecified hearing loss, bilateral: Secondary | ICD-10-CM | POA: Insufficient documentation

## 2012-05-19 LAB — BASIC METABOLIC PANEL
BUN: 20 mg/dL (ref 6–23)
CO2: 30 mEq/L (ref 19–32)
Chloride: 103 mEq/L (ref 96–112)
Creatinine, Ser: 0.9 mg/dL (ref 0.4–1.2)
Potassium: 3.1 mEq/L — ABNORMAL LOW (ref 3.5–5.1)

## 2012-05-19 LAB — HEPATIC FUNCTION PANEL
ALT: 27 U/L (ref 0–35)
AST: 40 U/L — ABNORMAL HIGH (ref 0–37)
Bilirubin, Direct: 0.1 mg/dL (ref 0.0–0.3)
Total Bilirubin: 0.5 mg/dL (ref 0.3–1.2)
Total Protein: 7.6 g/dL (ref 6.0–8.3)

## 2012-05-19 LAB — TSH: TSH: 0.77 u[IU]/mL (ref 0.35–5.50)

## 2012-05-19 LAB — CBC WITH DIFFERENTIAL/PLATELET
Basophils Absolute: 0 10*3/uL (ref 0.0–0.1)
Basophils Relative: 0.9 % (ref 0.0–3.0)
Eosinophils Absolute: 0.1 10*3/uL (ref 0.0–0.7)
Lymphocytes Relative: 35.9 % (ref 12.0–46.0)
MCHC: 32.9 g/dL (ref 30.0–36.0)
MCV: 91.7 fl (ref 78.0–100.0)
Monocytes Absolute: 0.4 10*3/uL (ref 0.1–1.0)
Neutrophils Relative %: 53.8 % (ref 43.0–77.0)
Platelets: 252 10*3/uL (ref 150.0–400.0)
RBC: 4.29 Mil/uL (ref 3.87–5.11)
RDW: 14.8 % — ABNORMAL HIGH (ref 11.5–14.6)

## 2012-05-19 LAB — MAGNESIUM: Magnesium: 1.8 mg/dL (ref 1.5–2.5)

## 2012-05-19 LAB — LIPID PANEL
HDL: 107.9 mg/dL (ref >39.00)
Total CHOL/HDL Ratio: 2
Triglycerides: 51 mg/dL (ref 0.0–149.0)
VLDL: 10.2 mg/dL (ref 0.0–40.0)

## 2012-05-19 MED ORDER — HYDROCHLOROTHIAZIDE 25 MG PO TABS: 25.0000 mg | ORAL_TABLET | Freq: Every day | ORAL | Status: DC

## 2012-05-19 MED ORDER — FEXOFENADINE HCL 180 MG PO TABS: 180.0000 mg | ORAL_TABLET | Freq: Every day | ORAL | Status: DC

## 2012-05-19 MED ORDER — AMLODIPINE BESYLATE 10 MG PO TABS: 10.0000 mg | ORAL_TABLET | Freq: Every day | ORAL | Status: DC

## 2012-05-19 MED ORDER — FLUTICASONE PROPIONATE 50 MCG/ACT NA SUSP: 2.0000 | Freq: Every day | NASAL | Status: DC

## 2012-05-19 NOTE — Assessment & Plan Note (Signed)
Mild to mod aug 2012, for hep profile r/o hep c, consider u/s,  to f/u any worsening symptoms or concerns

## 2012-05-19 NOTE — Assessment & Plan Note (Signed)
Improved after irrigation 

## 2012-05-19 NOTE — Assessment & Plan Note (Signed)
Described as whole body burning without rash or other- for b12 level

## 2012-05-19 NOTE — Progress Notes (Signed)
Subjective:    Patient ID: Briana Schroeder, female    DOB: 04-04-1946, 66 y.o.   MRN: [id number]  HPI  Here for f/u;  Overall doing ok;  Pt denies CP, worsening SOB, DOE, wheezing, orthopnea, PND, worsening LE edema, palpitations, dizziness or syncope.  Pt denies neurological change such as new Headache, facial or extremity weakness.  Pt denies polydipsia, polyuria, or low sugar symptoms. Pt states overall good compliance with treatment and medications, good tolerability, and trying to follow lower cholesterol diet.  Pt denies worsening depressive symptoms, suicidal ideation or panic. No fever, wt loss, night sweats, loss of appetite, or other constitutional symptoms.  Pt states good ability with ADL's, low fall risk, home safety reviewed and adequate, no significant changes in hearing or vision, and occasionally active with exercise.  Has had incresaed stress related to daughter recent. Declines flu shot today.  Has had "whole body" burning episodes without rash or visible problems, quite annoying, mild, for several months Past Medical History  Diagnosis Date  . ALLERGIC RHINITIS 08/02/2009  . ANXIETY 10/18/2008  . ARM PAIN, RIGHT 04/04/2009  . BACK PAIN 08/02/2009  . CHEST PAIN 12/05/2009  . COLONIC POLYPS, HX OF 10/18/2008  . DIZZINESS 12/05/2009  . EPIGASTRIC PAIN 01/09/2010  . FATIGUE 09/06/2009  . GERD 12/06/2009  . HOARSENESS 09/06/2009  . HYPERLIPIDEMIA 10/18/2008  . IBS 10/18/2008  . MYALGIA 03/21/2009  . PULMONARY SARCOIDOSIS 10/18/2008  . SINUSITIS- ACUTE-NOS 03/21/2009  . VOCAL CORD NODULE 09/06/2009  . HYPERTENSION 10/18/2008  . Osteopenia     -1.06 Jul 2009  . Fibroid    Past Surgical History  Procedure Date  . S/p right finger tendon surgury   . Hx of vocal cord nodules   . Abdominal hysterectomy 08/1992    TAH-DR G FOR FIBROIDS AND MENORRHAGIA    reports that she has never smoked. She does not have any smokeless tobacco history on file. She reports that she does not drink alcohol. Her drug  history not on file. family history includes Breast cancer in her mother; Diabetes in her mother; and Hypertension in her mother and sister. Allergies  Allergen Reactions  . Sertraline Hcl Diarrhea   Current Outpatient Prescriptions on File Prior to Visit  Medication Sig Dispense Refill  . aspirin 81 MG tablet Take 81 mg by mouth daily.        . [DISCONTINUED] amLODipine (NORVASC) 10 MG tablet Take 1 tablet (10 mg total) by mouth daily.  90 tablet  3  . [DISCONTINUED] hydrochlorothiazide 25 MG tablet Take 1 tablet (25 mg total) by mouth daily.  90 tablet  3  . [DISCONTINUED] fexofenadine (ALLEGRA) 180 MG tablet Take 1 tablet (180 mg total) by mouth daily.  90 tablet  3  . [DISCONTINUED] fluticasone (FLONASE) 50 MCG/ACT nasal spray Place 2 sprays into the nose daily.  16 g  3   Review of Systems  Constitutional: Negative for diaphoresis and unexpected weight change.  HENT: Negative for tinnitus.   Eyes: Negative for photophobia and visual disturbance.  Respiratory: Negative for choking and stridor.   Gastrointestinal: Negative for vomiting and blood in stool.  Genitourinary: Negative for hematuria and decreased urine volume.  Musculoskeletal: Negative for gait problem.  Skin: Negative for color change and wound.  Neurological: Negative for tremors and numbness.  Psychiatric/Behavioral: Negative for decreased concentration. The patient is not hyperactive.       Objective:   Physical Exam BP 118/70  Pulse 66  Temp 97.6 F (36.4  C) (Oral)  Ht 5\' 3"  (1.6 m)  Wt 136 lb 4 oz (61.803 kg)  BMI 24.14 kg/m2  SpO2 97% Physical Exam  VS noted, not ill appearing Constitutional: Pt appears well-developed and well-nourished.  HENT: Head: Normocephalic.  Right Ear: External ear normal.  Left Ear: External ear normal.  Right canal clear and hearing improved after wax impactions irrigated Eyes: Conjunctivae and EOM are normal. Pupils are equal, round, and reactive to light.  Neck: Normal  range of motion. Neck supple.  Cardiovascular: Normal rate and regular rhythm.   Pulmonary/Chest: Effort normal and breath sounds normal.  Abd:  Soft, NT, non-distended, + BS Neurological: Pt is alert. Not confused , motor/sens/dtr/gait intact, no tremor Skin: Skin is warm. No erythema.  Psychiatric: Pt behavior is normal. Thought content normal. 1+ nervous    Assessment & Plan:

## 2012-05-19 NOTE — Patient Instructions (Addendum)
Your right ear was irrigated of wax today Continue all other medications as before Your refills were done today Please go to LAB in the Basement for the blood and/or urine tests to be done today You will be contacted by phone if any changes need to be made immediately.  Otherwise, you will receive a letter about your results with an explanation, but please check with MyChart first. Thank you for enrolling in MyChart. Please follow the instructions below to securely access your online medical record. MyChart allows you to send messages to your doctor, view your test results, renew your prescriptions, schedule appointments, and more. To Log into MyChart, please go to [email or web address], and your Username is: [email or web address] Please remember to followup with your GYN for the yearly pap smear and/or mammogram You are otherwise up to date with prevention measures Please return in 6 months, or sooner if needed

## 2012-05-19 NOTE — Assessment & Plan Note (Signed)
stable overall by hx and exam, most recent data reviewed with pt, and pt to continue medical treatment as before, ECG reviewed as per emr BP Readings from Last 3 Encounters:  05/19/12 118/70  07/13/11 138/86  05/15/11 110/70

## 2012-05-20 ENCOUNTER — Other Ambulatory Visit: Payer: Self-pay | Admitting: Internal Medicine

## 2012-05-20 LAB — HEPATITIS PANEL, ACUTE
HCV Ab: NEGATIVE
Hepatitis B Surface Ag: NEGATIVE

## 2012-05-20 MED ORDER — POTASSIUM CHLORIDE ER 10 MEQ PO TBCR
10.0000 meq | EXTENDED_RELEASE_TABLET | Freq: Every day | ORAL | Status: DC
Start: 2012-05-20 — End: 2013-05-21

## 2012-08-16 ENCOUNTER — Other Ambulatory Visit: Payer: Self-pay

## 2012-11-17 ENCOUNTER — Ambulatory Visit (INDEPENDENT_AMBULATORY_CARE_PROVIDER_SITE_OTHER): Payer: Medicare Other | Admitting: Internal Medicine

## 2012-11-17 ENCOUNTER — Encounter: Payer: Self-pay | Admitting: Internal Medicine

## 2012-11-17 ENCOUNTER — Other Ambulatory Visit (INDEPENDENT_AMBULATORY_CARE_PROVIDER_SITE_OTHER): Payer: Medicare Other

## 2012-11-17 VITALS — BP 148/90 | HR 86 | Temp 97.9°F | Wt 145.2 lb

## 2012-11-17 DIAGNOSIS — J309 Allergic rhinitis, unspecified: Secondary | ICD-10-CM

## 2012-11-17 DIAGNOSIS — I10 Essential (primary) hypertension: Secondary | ICD-10-CM | POA: Diagnosis not present

## 2012-11-17 DIAGNOSIS — E785 Hyperlipidemia, unspecified: Secondary | ICD-10-CM | POA: Diagnosis not present

## 2012-11-17 DIAGNOSIS — E876 Hypokalemia: Secondary | ICD-10-CM

## 2012-11-17 LAB — LIPID PANEL
Total CHOL/HDL Ratio: 2
Triglycerides: 39 mg/dL (ref 0.0–149.0)

## 2012-11-17 LAB — BASIC METABOLIC PANEL
BUN: 15 mg/dL (ref 6–23)
CO2: 29 mEq/L (ref 19–32)
Calcium: 9.8 mg/dL (ref 8.4–10.5)
Chloride: 102 mEq/L (ref 96–112)
Creatinine, Ser: 0.7 mg/dL (ref 0.4–1.2)
Glucose, Bld: 99 mg/dL (ref 70–99)

## 2012-11-17 LAB — LDL CHOLESTEROL, DIRECT: Direct LDL: 83.3 mg/dL

## 2012-11-17 MED ORDER — CETIRIZINE HCL 10 MG PO TABS: 10.0000 mg | ORAL_TABLET | Freq: Every day | ORAL | Status: DC

## 2012-11-17 MED ORDER — MOMETASONE FUROATE 50 MCG/ACT NA SUSP: 2.0000 | Freq: Every day | NASAL | Status: DC

## 2012-11-17 NOTE — Progress Notes (Signed)
Subjective:    Patient ID: Briana Schroeder, female    DOB: Nov 11, 1945, 67 y.o.   MRN: [id number]  HPI  Here to f/u; overall doing ok,  Pt denies chest pain, increased sob or doe, wheezing, orthopnea, PND, increased LE swelling, palpitations, dizziness or syncope.  Pt denies polydipsia, polyuria, .  Pt denies new neurological symptoms such as new headache, or facial or extremity weakness or numbness.   Pt states overall good compliance with meds, has been trying to follow lower cholesterol, diet, with wt overall stable, and remains active for her age.  Tolerating all meds including K, and back to taking her statin.  Does have several wks ongoing nasal allergy symptoms with clearish congestion, itch and sneezing, without fever, pain, ST, cough, swelling or wheezing. Past Medical History  Diagnosis Date  . ALLERGIC RHINITIS 08/02/2009  . ANXIETY 10/18/2008  . ARM PAIN, RIGHT 04/04/2009  . BACK PAIN 08/02/2009  . CHEST PAIN 12/05/2009  . COLONIC POLYPS, HX OF 10/18/2008  . DIZZINESS 12/05/2009  . EPIGASTRIC PAIN 01/09/2010  . FATIGUE 09/06/2009  . GERD 12/06/2009  . HOARSENESS 09/06/2009  . HYPERLIPIDEMIA 10/18/2008  . IBS 10/18/2008  . MYALGIA 03/21/2009  . PULMONARY SARCOIDOSIS 10/18/2008  . SINUSITIS- ACUTE-NOS 03/21/2009  . VOCAL CORD NODULE 09/06/2009  . HYPERTENSION 10/18/2008  . Osteopenia     -1.06 Jul 2009  . Fibroid    Past Surgical History  Procedure Laterality Date  . S/p right finger tendon surgury    . Hx of vocal cord nodules    . Abdominal hysterectomy  08/1992    TAH-DR G FOR FIBROIDS AND MENORRHAGIA    reports that she has never smoked. She does not have any smokeless tobacco history on file. She reports that she does not drink alcohol. Her drug history is not on file. family history includes Breast cancer in her mother; Diabetes in her mother; and Hypertension in her mother and sister. Allergies  Allergen Reactions  . Sertraline Hcl Diarrhea   Current Outpatient Prescriptions on File  Prior to Visit  Medication Sig Dispense Refill  . amLODipine (NORVASC) 10 MG tablet Take 1 tablet (10 mg total) by mouth daily.  90 tablet  3  . aspirin 81 MG tablet Take 81 mg by mouth daily.        . fexofenadine (ALLEGRA) 180 MG tablet Take 1 tablet (180 mg total) by mouth daily.  90 tablet  3  . fluticasone (FLONASE) 50 MCG/ACT nasal spray Place 2 sprays into the nose daily.  16 g  3  . hydrochlorothiazide (HYDRODIURIL) 25 MG tablet Take 1 tablet (25 mg total) by mouth daily.  90 tablet  3  . potassium chloride (K-DUR) 10 MEQ tablet Take 1 tablet (10 mEq total) by mouth daily.  90 tablet  3   No current facility-administered medications on file prior to visit.   Review of Systems  Constitutional: Negative for unexpected weight change, or unusual diaphoresis  HENT: Negative for tinnitus.   Eyes: Negative for photophobia and visual disturbance.  Respiratory: Negative for choking and stridor.   Gastrointestinal: Negative for vomiting and blood in stool.  Genitourinary: Negative for hematuria and decreased urine volume.  Musculoskeletal: Negative for acute joint swelling Skin: Negative for color change and wound.  Neurological: Negative for tremors and numbness other than noted  Psychiatric/Behavioral: Negative for decreased concentration or  hyperactivity.       Objective:   Physical Exam BP 148/90  Pulse 86  Temp(Src) 97.9  F (36.6 C) (Oral)  Wt 145 lb 3.2 oz (65.862 kg)  BMI 25.73 kg/m2  SpO2 98% VS noted,  Constitutional: Pt appears well-developed and well-nourished.  HENT: Head: NCAT.  Right Ear: External ear normal.  Left Ear: External ear normal.  Eyes: Conjunctivae and EOM are normal. Pupils are equal, round, and reactive to light.  Bilat tm's with mild erythema.  Max sinus areas non tender.  Pharynx with mild erythema, no exudate Neck: Normal range of motion. Neck supple.  Cardiovascular: Normal rate and regular rhythm.   Pulmonary/Chest: Effort normal and breath  sounds normal.  Neurological: Pt is alert. Not confused  Skin: Skin is warm. No erythema.  Psychiatric: Pt behavior is normal. Thought content normal.     Assessment & Plan:

## 2012-11-17 NOTE — Assessment & Plan Note (Signed)
Now on sumplementation, for bmet today

## 2012-11-17 NOTE — Assessment & Plan Note (Signed)
stable overall by history and exam, recent data reviewed with pt, and pt to continue medical treatment as before,  to f/u any worsening symptoms or concerns BP Readings from Last 3 Encounters:  11/17/12 148/90  05/19/12 118/70  07/13/11 138/86

## 2012-11-17 NOTE — Patient Instructions (Signed)
OK to try the sample Nasonex at 2 spray/side, once per day Please take all new medication as prescribed - the zyrtec sent to your pharmacy Please continue all other medications as before Please have the pharmacy call with any other refills you may need. Please go to the LAB in the Basement (turn left off the elevator) for the tests to be done today You will be contacted by phone if any changes need to be made immediately.  Otherwise, you will receive a letter about your results with an explanation, but please check with MyChart first. Thank you for enrolling in MyChart. Please follow the instructions below to securely access your online medical record. MyChart allows you to send messages to your doctor, view your test results, renew your prescriptions, schedule appointments, and more.

## 2012-11-17 NOTE — Assessment & Plan Note (Signed)
stable overall by history and exam, recent data reviewed with pt, and pt to continue medical treatment as before,  to f/u any worsening symptoms or concerns Lab Results  Component Value Date   LDLCALC  Value: 105        Total Cholesterol/HDL:CHD Risk Coronary Heart Disease Risk Table                     Men   Women  1/2 Average Risk   3.4   3.3  Average Risk       5.0   4.4  2 X Average Risk   9.6   7.1  3 X Average Risk  23.4   11.0        Use the calculated Patient Ratio above and the CHD Risk Table to determine the patient's CHD Risk.        ATP III CLASSIFICATION (LDL):  <100     mg/dL   Optimal  100-129  mg/dL   Near or Above                    Optimal  130-159  mg/dL   Borderline  160-189  mg/dL   High  >190     mg/dL   Very High* 01/05/2010    

## 2012-11-17 NOTE — Assessment & Plan Note (Signed)
Mild to mod, for zyrtec prn, and gave sample nasonex,  to f/u any worsening symptoms or concerns or call if needs prescription

## 2012-11-20 ENCOUNTER — Telehealth: Payer: Self-pay

## 2012-11-20 NOTE — Telephone Encounter (Signed)
Phone call from patient, she states she was seen 11/17/12. She just needed clarification on a couple diagnosis codes that were on her AVS. I let her know the hypokalemia is low potassium and hypertension is high blood pressure. She had no further questions or concerns.

## 2012-12-04 ENCOUNTER — Other Ambulatory Visit: Payer: Self-pay | Admitting: Internal Medicine

## 2012-12-04 ENCOUNTER — Other Ambulatory Visit: Payer: Self-pay | Admitting: Gynecology

## 2012-12-04 DIAGNOSIS — Z803 Family history of malignant neoplasm of breast: Secondary | ICD-10-CM

## 2012-12-04 DIAGNOSIS — Z1231 Encounter for screening mammogram for malignant neoplasm of breast: Secondary | ICD-10-CM

## 2013-01-08 ENCOUNTER — Ambulatory Visit: Payer: Medicare Other

## 2013-01-23 ENCOUNTER — Ambulatory Visit
Admission: RE | Admit: 2013-01-23 | Discharge: 2013-01-23 | Disposition: A | Discharge status: Home / Self Care | Payer: Medicare Other | Source: Ambulatory Visit | Attending: Internal Medicine | Admitting: Internal Medicine

## 2013-01-23 DIAGNOSIS — Z1231 Encounter for screening mammogram for malignant neoplasm of breast: Secondary | ICD-10-CM

## 2013-01-23 DIAGNOSIS — Z803 Family history of malignant neoplasm of breast: Secondary | ICD-10-CM

## 2013-01-26 LAB — HM MAMMOGRAPHY: HM Mammogram: NEGATIVE

## 2013-02-04 ENCOUNTER — Other Ambulatory Visit: Payer: Self-pay

## 2013-03-05 DIAGNOSIS — H2510 Age-related nuclear cataract, unspecified eye: Secondary | ICD-10-CM | POA: Diagnosis not present

## 2013-05-07 ENCOUNTER — Other Ambulatory Visit: Payer: Self-pay

## 2013-05-21 ENCOUNTER — Encounter: Payer: Self-pay | Admitting: Internal Medicine

## 2013-05-21 ENCOUNTER — Ambulatory Visit (INDEPENDENT_AMBULATORY_CARE_PROVIDER_SITE_OTHER): Payer: Medicare Other | Admitting: Internal Medicine

## 2013-05-21 VITALS — BP 142/90 | HR 87 | Temp 98.7°F | Ht 63.0 in | Wt 148.2 lb

## 2013-05-21 DIAGNOSIS — H9193 Unspecified hearing loss, bilateral: Secondary | ICD-10-CM | POA: Insufficient documentation

## 2013-05-21 DIAGNOSIS — J069 Acute upper respiratory infection, unspecified: Secondary | ICD-10-CM | POA: Diagnosis not present

## 2013-05-21 DIAGNOSIS — I10 Essential (primary) hypertension: Secondary | ICD-10-CM

## 2013-05-21 DIAGNOSIS — E785 Hyperlipidemia, unspecified: Secondary | ICD-10-CM

## 2013-05-21 DIAGNOSIS — H9190 Unspecified hearing loss, unspecified ear: Secondary | ICD-10-CM | POA: Diagnosis not present

## 2013-05-21 DIAGNOSIS — Z23 Encounter for immunization: Secondary | ICD-10-CM | POA: Diagnosis not present

## 2013-05-21 MED ORDER — AZITHROMYCIN 250 MG PO TABS: ORAL_TABLET | ORAL | Status: DC

## 2013-05-21 MED ORDER — AMLODIPINE BESYLATE 10 MG PO TABS: 10.0000 mg | ORAL_TABLET | Freq: Every day | ORAL | Status: DC

## 2013-05-21 MED ORDER — HYDROCHLOROTHIAZIDE 25 MG PO TABS: 25.0000 mg | ORAL_TABLET | Freq: Every day | ORAL | Status: DC

## 2013-05-21 MED ORDER — POTASSIUM CHLORIDE ER 10 MEQ PO TBCR: 10.0000 meq | EXTENDED_RELEASE_TABLET | Freq: Every day | ORAL | Status: DC

## 2013-05-21 NOTE — Progress Notes (Signed)
Subjective:    Patient ID: Briana Schroeder, female    DOB: 1946-05-24, 67 y.o.   MRN: [id number]  HPI Here to f/u; overall doing ok,  Pt denies chest pain, increased sob or doe, wheezing, orthopnea, PND, increased LE swelling, palpitations, dizziness or syncope.  Pt denies polydipsia, polyuria, or low sugar symptoms such as weakness or confusion improved with po intake.  Pt denies new neurological symptoms such as new headache, or facial or extremity weakness or numbness.   Pt states overall good compliance with meds, has been trying to follow lower cholesterol, diet, with wt overall stable,  but little exercise however. Also incidentally -  Here with 2-3 days acute onset fever, facial pain, pressure, headache, general weakness and malaise, and greenish d/c, with mild ST and cough.   Cannot hear bilat well for 2 wks - ? Wax vs other. Overall good compliance with treatment, and good medicine tolerability. Past Medical History  Diagnosis Date  . ALLERGIC RHINITIS 08/02/2009  . ANXIETY 10/18/2008  . ARM PAIN, RIGHT 04/04/2009  . BACK PAIN 08/02/2009  . CHEST PAIN 12/05/2009  . COLONIC POLYPS, HX OF 10/18/2008  . DIZZINESS 12/05/2009  . EPIGASTRIC PAIN 01/09/2010  . FATIGUE 09/06/2009  . GERD 12/06/2009  . HOARSENESS 09/06/2009  . HYPERLIPIDEMIA 10/18/2008  . IBS 10/18/2008  . MYALGIA 03/21/2009  . PULMONARY SARCOIDOSIS 10/18/2008  . SINUSITIS- ACUTE-NOS 03/21/2009  . VOCAL CORD NODULE 09/06/2009  . HYPERTENSION 10/18/2008  . Osteopenia     -1.06 Jul 2009  . Fibroid    Past Surgical History  Procedure Laterality Date  . S/p right finger tendon surgury    . Hx of vocal cord nodules    . Abdominal hysterectomy  08/1992    TAH-DR G FOR FIBROIDS AND MENORRHAGIA    reports that she has never smoked. She does not have any smokeless tobacco history on file. She reports that she does not drink alcohol. Her drug history is not on file. family history includes Breast cancer in her mother; Diabetes in her mother;  Hypertension in her mother and sister. Allergies  Allergen Reactions  . Sertraline Hcl Diarrhea   Current Outpatient Prescriptions on File Prior to Visit  Medication Sig Dispense Refill  . aspirin 81 MG tablet Take 81 mg by mouth daily.        . cetirizine (ZYRTEC) 10 MG tablet Take 1 tablet (10 mg total) by mouth daily.  90 tablet  3  . fluticasone (FLONASE) 50 MCG/ACT nasal spray Place 2 sprays into the nose daily.  16 g  3  . mometasone (NASONEX) 50 MCG/ACT nasal spray Place 2 sprays into the nose daily.  17 g  2  . Multiple Vitamin (MULTIVITAMIN) tablet Take 1 tablet by mouth daily.       No current facility-administered medications on file prior to visit.   Review of Systems  Constitutional: Negative for unexpected weight change, or unusual diaphoresis  HENT: Negative for tinnitus.   Eyes: Negative for photophobia and visual disturbance.  Respiratory: Negative for choking and stridor.   Gastrointestinal: Negative for vomiting and blood in stool.  Genitourinary: Negative for hematuria and decreased urine volume.  Musculoskeletal: Negative for acute joint swelling Skin: Negative for color change and wound.  Neurological: Negative for tremors and numbness other than noted  Psychiatric/Behavioral: Negative for decreased concentration or  hyperactivity.       Objective:   Physical Exam BP 142/90  Pulse 87  Temp(Src) 98.7 F (37.1 C) (Oral)  Ht 5\' 3"  (1.6 m)  Wt 148 lb 4 oz (67.246 kg)  BMI 26.27 kg/m2  SpO2 94% Repeat BP left arm large cuff 130/82 VS noted, mild ill Constitutional: Pt appears well-developed and well-nourished.  HENT: Head: NCAT.  Right Ear: External ear normal.  Left Ear: External ear normal.  Bilat TM's ok , canals cleared of wax impactions Bilat tm's with mild erythema.  Max sinus areas non tender.  Pharynx with mild erythema, no exudate Eyes: Conjunctivae and EOM are normal. Pupils are equal, round, and reactive to light.  Neck: Normal range of  motion. Neck supple.  Cardiovascular: Normal rate and regular rhythm.   Pulmonary/Chest: Effort normal and breath sounds normal.  Abd:  Soft, NT, non-distended, + BS Neurological: Pt is alert. Not confused  Skin: Skin is warm. No erythema.  Psychiatric: Pt behavior is normal. Thought content normal.      Assessment & Plan:

## 2013-05-21 NOTE — Assessment & Plan Note (Signed)
stable overall by history and exam, recent data reviewed with pt, and pt to continue medical treatment as before,  to f/u any worsening symptoms or concerns  BP Readings from Last 3 Encounters:  05/21/13 142/90  11/17/12 148/90  05/19/12 118/70

## 2013-05-21 NOTE — Assessment & Plan Note (Signed)
Improved with irrigation bilat 

## 2013-05-21 NOTE — Assessment & Plan Note (Signed)
Mild to mod, for antibx course,  to f/u any worsening symptoms or concerns 

## 2013-05-21 NOTE — Progress Notes (Signed)
Pre-visit discussion using our clinic review tool. No additional management support is needed unless otherwise documented below in the visit note.  

## 2013-05-21 NOTE — Assessment & Plan Note (Signed)
stable overall by history and exam, recent data reviewed with pt, and pt to continue medical treatment as before,  to f/u any worsening symptoms or concerns Lab Results  Component Value Date   LDLCALC  Value: 105        Total Cholesterol/HDL:CHD Risk Coronary Heart Disease Risk Table                     Men   Women  1/2 Average Risk   3.4   3.3  Average Risk       5.0   4.4  2 X Average Risk   9.6   7.1  3 X Average Risk  23.4   11.0        Use the calculated Patient Ratio above and the CHD Risk Table to determine the patient's CHD Risk.        ATP III CLASSIFICATION (LDL):  <100     mg/dL   Optimal  100-129  mg/dL   Near or Above                    Optimal  130-159  mg/dL   Borderline  160-189  mg/dL   High  >190     mg/dL   Very High* 01/05/2010    

## 2013-05-21 NOTE — Addendum Note (Signed)
Addended by: Scharlene Gloss B on: 05/21/2013 11:00 AM   Modules accepted: Orders

## 2013-05-21 NOTE — Patient Instructions (Addendum)
Please return if you change your mind about the flu shot You had the new Prevnar Pneumonia shot today Your ears were irrigated of wax today Your repeat Blood Pressure was 130/82 today; please continue to monitor your blood pressure on a regular basis, with the goal being less than 140/90 Please take all new medication as prescribed - the antibiotic You can also take Delsym OTC for cough, and/or Mucinex (or it's generic off brand) for congestion, and tylenol as needed for pain. Please continue all other medications as before, and refills have been done if requested. Please have the pharmacy call with any other refills you may need. No further lab work needed today; we will plan on next time for this

## 2013-06-11 ENCOUNTER — Other Ambulatory Visit: Payer: Self-pay | Admitting: Internal Medicine

## 2013-07-21 ENCOUNTER — Encounter: Payer: Self-pay | Admitting: Internal Medicine

## 2013-07-21 ENCOUNTER — Ambulatory Visit (INDEPENDENT_AMBULATORY_CARE_PROVIDER_SITE_OTHER): Payer: Medicare Other | Admitting: Internal Medicine

## 2013-07-21 VITALS — BP 130/80 | HR 60 | Temp 98.8°F | Ht 63.0 in | Wt 152.1 lb

## 2013-07-21 DIAGNOSIS — L299 Pruritus, unspecified: Secondary | ICD-10-CM

## 2013-07-21 DIAGNOSIS — F411 Generalized anxiety disorder: Secondary | ICD-10-CM | POA: Diagnosis not present

## 2013-07-21 DIAGNOSIS — I10 Essential (primary) hypertension: Secondary | ICD-10-CM | POA: Diagnosis not present

## 2013-07-21 MED ORDER — PREDNISONE 10 MG PO TABS: ORAL_TABLET | ORAL | Status: DC

## 2013-07-21 MED ORDER — METHYLPREDNISOLONE ACETATE 80 MG/ML IJ SUSP
80.0000 mg | Freq: Once | INTRAMUSCULAR | Status: AC
  Administered 2013-07-21: 80 mg via INTRAMUSCULAR

## 2013-07-21 MED ORDER — AMLODIPINE BESYLATE 10 MG PO TABS: 10.0000 mg | ORAL_TABLET | Freq: Every day | ORAL | Status: DC

## 2013-07-21 NOTE — Patient Instructions (Signed)
You had the steroid shot today Please take all new medication as prescribed Please continue all other medications as before Please also take benadryl 50 mg every 6 hrs as needed for itching, as well Pepcid OTC dialy as well Please call by Friday if not improved, to consider lab work (liver tests) or allergy referral

## 2013-07-21 NOTE — Progress Notes (Signed)
Subjective:    Patient ID: Fayne Norrie, female    DOB: [birth year], 68 y.o.   MRN: [id number]  HPI  Here to c/o itching, seemed to start with scalp with some ? Of rash without swelling or d/c, then has itching "all over" again wtithout rash or swelling.  Has noted some dry skin, but does not think more than usual.  No other fever, recent liver, renal, or known hematologic, or neuritic symptoms. No sinus allergy, lip swelling, tongue swelling symptoms. Pt denies chest pain, increased sob or doe, wheezing, orthopnea, PND, increased LE swelling, palpitations, dizziness or syncope. Denies worsening depressive symptoms, suicidal ideation, or panic; has ongoing anxiety - ? Worse due to the itching. No recent med change, or common med causeing pruritis Past Medical History  Diagnosis Date  . ALLERGIC RHINITIS 08/02/2009  . ANXIETY 10/18/2008  . ARM PAIN, RIGHT 04/04/2009  . BACK PAIN 08/02/2009  . CHEST PAIN 12/05/2009  . COLONIC POLYPS, HX OF 10/18/2008  . DIZZINESS 12/05/2009  . EPIGASTRIC PAIN 01/09/2010  . FATIGUE 09/06/2009  . GERD 12/06/2009  . HOARSENESS 09/06/2009  . HYPERLIPIDEMIA 10/18/2008  . IBS 10/18/2008  . MYALGIA 03/21/2009  . PULMONARY SARCOIDOSIS 10/18/2008  . SINUSITIS- ACUTE-NOS 03/21/2009  . VOCAL CORD NODULE 09/06/2009  . HYPERTENSION 10/18/2008  . Osteopenia     -1.06 Jul 2009  . Fibroid    Past Surgical History  Procedure Laterality Date  . S/p right finger tendon surgury    . Hx of vocal cord nodules    . Abdominal hysterectomy  08/1992    TAH-DR G FOR FIBROIDS AND MENORRHAGIA    reports that she has never smoked. She does not have any smokeless tobacco history on file. She reports that she does not drink alcohol. Her drug history is not on file. family history includes Breast cancer in her mother; Diabetes in her mother; Hypertension in her mother and sister. Allergies  Allergen Reactions  . Sertraline Hcl Diarrhea   Current Outpatient Prescriptions on File Prior to Visit    Medication Sig Dispense Refill  . aspirin 81 MG tablet Take 81 mg by mouth daily.        . fluticasone (FLONASE) 50 MCG/ACT nasal spray Place 2 sprays into the nose daily.  16 g  3  . hydrochlorothiazide (HYDRODIURIL) 25 MG tablet TAKE ONE TABLET BY MOUTH ONE TIME DAILY  90 tablet  3  . mometasone (NASONEX) 50 MCG/ACT nasal spray Place 2 sprays into the nose daily.  17 g  2  . Multiple Vitamin (MULTIVITAMIN) tablet Take 1 tablet by mouth daily.      . potassium chloride (K-DUR) 10 MEQ tablet Take 1 tablet (10 mEq total) by mouth daily.  90 tablet  3   No current facility-administered medications on file prior to visit.   Review of Systems All otherwise neg per pt     Objective:   Physical Exam BP 130/80  Pulse 60  Temp(Src) 98.8 F (37.1 C) (Oral)  Ht 5\' 3"  (1.6 m)  Wt 152 lb 2 oz (69.003 kg)  BMI 26.95 kg/m2  SpO2 96% VS noted,  Constitutional: Pt appears well-developed and well-nourished.  HENT: Head: NCAT.  Right Ear: External ear normal.  Left Ear: External ear normal.  Eyes: Conjunctivae and EOM are normal. Pupils are equal, round, and reactive to light.  Neck: Normal range of motion. Neck supple.  Cardiovascular: Normal rate and regular rhythm.   Pulmonary/Chest: Effort normal and breath sounds normal.  Abd:  Soft, NT, non-distended, + BS - no HSM Neurological: Pt is alert. Not confused  Skin: Skin is warm. No erythema. , swelling, no rash to scalp or other area of body, has some dry skin noted to arms and legs Psychiatric: Pt behavior is normal. Thought content normal. 1+ nervous    Assessment & Plan:

## 2013-07-21 NOTE — Progress Notes (Signed)
Pre-visit discussion using our clinic review tool. No additional management support is needed unless otherwise documented below in the visit note.  

## 2013-07-22 ENCOUNTER — Telehealth: Payer: Self-pay | Admitting: *Deleted

## 2013-07-22 MED ORDER — FAMOTIDINE 20 MG PO TABS: 20.0000 mg | ORAL_TABLET | Freq: Two times a day (BID) | ORAL | Status: DC | PRN

## 2013-07-22 NOTE — Telephone Encounter (Signed)
rx done erx 

## 2013-07-22 NOTE — Telephone Encounter (Signed)
Patient informed. 

## 2013-07-22 NOTE — Telephone Encounter (Signed)
Patient called and stated that she was told to take Pepcid OTC. Patient states that she went to the pharmacy and they didn't have it OTC. Patient states that the pharmacist told her if could get Rx for it then her insurance will pay for it. Patient is calling to see if she could get a Rx. Please advise.

## 2013-07-28 ENCOUNTER — Encounter: Payer: Self-pay | Admitting: Internal Medicine

## 2013-07-28 ENCOUNTER — Other Ambulatory Visit: Payer: Self-pay | Admitting: Internal Medicine

## 2013-07-28 ENCOUNTER — Ambulatory Visit (INDEPENDENT_AMBULATORY_CARE_PROVIDER_SITE_OTHER): Payer: Medicare Other | Admitting: Internal Medicine

## 2013-07-28 ENCOUNTER — Other Ambulatory Visit (INDEPENDENT_AMBULATORY_CARE_PROVIDER_SITE_OTHER): Payer: Medicare Other

## 2013-07-28 DIAGNOSIS — F411 Generalized anxiety disorder: Secondary | ICD-10-CM | POA: Diagnosis not present

## 2013-07-28 DIAGNOSIS — L299 Pruritus, unspecified: Secondary | ICD-10-CM

## 2013-07-28 DIAGNOSIS — I10 Essential (primary) hypertension: Secondary | ICD-10-CM | POA: Diagnosis not present

## 2013-07-28 LAB — HEPATIC FUNCTION PANEL
ALK PHOS: 28 U/L — AB (ref 39–117)
ALT: 41 U/L — AB (ref 0–35)
AST: 27 U/L (ref 0–37)
Albumin: 3.8 g/dL (ref 3.5–5.2)
BILIRUBIN DIRECT: 0 mg/dL (ref 0.0–0.3)
BILIRUBIN TOTAL: 0.6 mg/dL (ref 0.3–1.2)
Total Protein: 7.4 g/dL (ref 6.0–8.3)

## 2013-07-28 LAB — CBC WITH DIFFERENTIAL/PLATELET
Basophils Absolute: 0 10*3/uL (ref 0.0–0.1)
Basophils Relative: 0.4 % (ref 0.0–3.0)
EOS ABS: 0.3 10*3/uL (ref 0.0–0.7)
Eosinophils Relative: 3 % (ref 0.0–5.0)
HEMATOCRIT: 40.2 % (ref 36.0–46.0)
Hemoglobin: 13.5 g/dL (ref 12.0–15.0)
LYMPHS ABS: 3.9 10*3/uL (ref 0.7–4.0)
Lymphocytes Relative: 37.3 % (ref 12.0–46.0)
MCHC: 33.5 g/dL (ref 30.0–36.0)
MCV: 89.8 fl (ref 78.0–100.0)
Monocytes Absolute: 0.6 10*3/uL (ref 0.1–1.0)
Monocytes Relative: 6.2 % (ref 3.0–12.0)
NEUTROS PCT: 53.1 % (ref 43.0–77.0)
Neutro Abs: 5.5 10*3/uL (ref 1.4–7.7)
PLATELETS: 316 10*3/uL (ref 150.0–400.0)
RBC: 4.47 Mil/uL (ref 3.87–5.11)
RDW: 14.8 % — AB (ref 11.5–14.6)
WBC: 10.4 10*3/uL (ref 4.5–10.5)

## 2013-07-28 LAB — BASIC METABOLIC PANEL
BUN: 24 mg/dL — ABNORMAL HIGH (ref 6–23)
CALCIUM: 9.9 mg/dL (ref 8.4–10.5)
CO2: 31 meq/L (ref 19–32)
CREATININE: 0.9 mg/dL (ref 0.4–1.2)
Chloride: 101 mEq/L (ref 96–112)
GFR: 83.07 mL/min (ref >60.00)
Glucose, Bld: 114 mg/dL — ABNORMAL HIGH (ref 70–99)
Potassium: 3.8 mEq/L (ref 3.5–5.1)
SODIUM: 138 meq/L (ref 135–145)

## 2013-07-28 LAB — TSH: TSH: 1.35 u[IU]/mL (ref 0.35–5.50)

## 2013-07-28 MED ORDER — AMITRIPTYLINE HCL 50 MG PO TABS: 50.0000 mg | ORAL_TABLET | Freq: Every day | ORAL | Status: DC

## 2013-07-28 MED ORDER — HYDROXYZINE HCL 25 MG PO TABS: 25.0000 mg | ORAL_TABLET | Freq: Three times a day (TID) | ORAL | Status: DC | PRN

## 2013-07-28 NOTE — Progress Notes (Signed)
Pre-visit discussion using our clinic review tool. No additional management support is needed unless otherwise documented below in the visit note.  

## 2013-07-28 NOTE — Assessment & Plan Note (Signed)
stable overall by history and exam, and pt to continue medical treatment as before,  to f/u any worsening symptoms or concerns 

## 2013-07-28 NOTE — Assessment & Plan Note (Addendum)
Situational increase, I suspect secondary to above, reassurance, o/w cont same tx

## 2013-07-28 NOTE — Patient Instructions (Signed)
OK to stop the benadryl OK to also stop the zyrtec Please take all new medication as prescribed - the hydroxyzine which can help with stress and the itching Please take all new medication as prescribed  - the generic elavil which can help itch related to nerves (we can try higher dose if works, but not enough) Please also try Eucerin cream for dry skin  Please go to the LAB in the Basement (turn left off the elevator) for the tests to be done today You will be contacted by phone if any changes need to be made immediately.  Otherwise, you will receive a letter about your results with an explanation, but please check with MyChart first.  Please call in 3-5 days if not improved, for dermatology referral

## 2013-07-28 NOTE — Assessment & Plan Note (Signed)
Etiology unclear, not improved with steroid trial, today for f/u labs, trial elavil , vistaril prn, and eucerin otc cream prn

## 2013-07-28 NOTE — Assessment & Plan Note (Signed)
stable overall by history and exam, recent data reviewed with pt, and pt to continue medical treatment as before,  to f/u any worsening symptoms or concerns BP Readings from Last 3 Encounters:  07/21/13 130/80  05/21/13 142/90  11/17/12 148/90    

## 2013-07-28 NOTE — Progress Notes (Signed)
Subjective:    Patient ID: Briana Schroeder, female    DOB: 1945/07/16, 68 y.o.   MRN: [id number]  HPI    Here to f/u, unfort no better with benadryl and prednisone trial, has not tried otc skin moisturizer, still with scalp and whole body itching worse at times to various locations such as one arm, then one leg, then another area, all without redness, swelling, fever, juandice or other hepatic, neuritic, renal symptoms.  Still with signficatn anxiety which she recognizes, has had more stressors recently, but Denies worsening depressive symptoms, suicidal ideation, or panic Past Medical History  Diagnosis Date  . ALLERGIC RHINITIS 08/02/2009  . ANXIETY 10/18/2008  . ARM PAIN, RIGHT 04/04/2009  . BACK PAIN 08/02/2009  . CHEST PAIN 12/05/2009  . COLONIC POLYPS, HX OF 10/18/2008  . DIZZINESS 12/05/2009  . EPIGASTRIC PAIN 01/09/2010  . FATIGUE 09/06/2009  . GERD 12/06/2009  . HOARSENESS 09/06/2009  . HYPERLIPIDEMIA 10/18/2008  . IBS 10/18/2008  . MYALGIA 03/21/2009  . PULMONARY SARCOIDOSIS 10/18/2008  . SINUSITIS- ACUTE-NOS 03/21/2009  . VOCAL CORD NODULE 09/06/2009  . HYPERTENSION 10/18/2008  . Osteopenia     -1.06 Jul 2009  . Fibroid    Past Surgical History  Procedure Laterality Date  . S/p right finger tendon surgury    . Hx of vocal cord nodules    . Abdominal hysterectomy  08/1992    TAH-DR G FOR FIBROIDS AND MENORRHAGIA    reports that she has never smoked. She does not have any smokeless tobacco history on file. She reports that she does not drink alcohol. Her drug history is not on file. family history includes Breast cancer in her mother; Diabetes in her mother; Hypertension in her mother and sister. Allergies  Allergen Reactions  . Sertraline Hcl Diarrhea   Current Outpatient Prescriptions on File Prior to Visit  Medication Sig Dispense Refill  . aspirin 81 MG tablet Take 81 mg by mouth daily.        . famotidine (PEPCID) 20 MG tablet Take 1 tablet (20 mg total) by mouth 2 (two) times  daily as needed for heartburn or indigestion.  60 tablet  1  . fluticasone (FLONASE) 50 MCG/ACT nasal spray Place 2 sprays into the nose daily.  16 g  3  . hydrochlorothiazide (HYDRODIURIL) 25 MG tablet TAKE ONE TABLET BY MOUTH ONE TIME DAILY  90 tablet  3  . mometasone (NASONEX) 50 MCG/ACT nasal spray Place 2 sprays into the nose daily.  17 g  2  . Multiple Vitamin (MULTIVITAMIN) tablet Take 1 tablet by mouth daily.      . potassium chloride (K-DUR) 10 MEQ tablet Take 1 tablet (10 mEq total) by mouth daily.  90 tablet  3   No current facility-administered medications on file prior to visit.    Review of Systems All otherwise neg per pt     Objective:   Physical Exam There were no vitals taken for this visit. VS noted,  Constitutional: Pt appears well-developed and well-nourished.  HENT: Head: NCAT.  Right Ear: External ear normal.  Left Ear: External ear normal.  Eyes: Conjunctivae and EOM are normal. Pupils are equal, round, and reactive to light.  Neck: Normal range of motion. Neck supple.  Cardiovascular: Normal rate and regular rhythm.   Pulmonary/Chest: Effort normal and breath sounds normal.  Neurological: Pt is alert. Not confused  Skin: Skin is warm. No erythema. No swelling or angioedema, some dry skin again noted to arms and  legs Psychiatric: Pt behavior is normal. Thought content normal.     Assessment & Plan:

## 2013-07-28 NOTE — Assessment & Plan Note (Signed)
stable overall by history and exam, recent data reviewed with pt, and pt to continue medical treatment as before,  to f/u any worsening symptoms or concerns BP Readings from Last 3 Encounters:  07/21/13 130/80  05/21/13 142/90  11/17/12 148/90

## 2013-07-28 NOTE — Assessment & Plan Note (Signed)
?   Dry skin vs allergic - for trial predpack asd, benadryl otc prn,  to f/u any worsening symptoms or concerns

## 2013-07-29 ENCOUNTER — Ambulatory Visit: Payer: Medicare Other

## 2013-07-29 DIAGNOSIS — R7309 Other abnormal glucose: Secondary | ICD-10-CM

## 2013-07-29 LAB — HEMOGLOBIN A1C: Hgb A1c MFr Bld: 6.4 % (ref 4.6–6.5)

## 2013-08-14 ENCOUNTER — Other Ambulatory Visit: Payer: Self-pay | Admitting: Internal Medicine

## 2013-08-19 ENCOUNTER — Telehealth: Payer: Self-pay | Admitting: *Deleted

## 2013-08-19 NOTE — Telephone Encounter (Signed)
Called the patient informed of results 

## 2013-08-19 NOTE — Telephone Encounter (Signed)
Patient phoned requesting lab results from 1/27-28/15 labs.  Please advise.  CB# [phone]

## 2013-08-19 NOTE — Telephone Encounter (Signed)
Results were put on mychart, but maybe pt did not realize this  All were normal except a very mild sugar elevation; the A1c was normal, however, so no new medication needed for sugar at this time

## 2013-09-07 DIAGNOSIS — K029 Dental caries, unspecified: Secondary | ICD-10-CM | POA: Diagnosis not present

## 2013-11-19 ENCOUNTER — Encounter: Payer: Self-pay | Admitting: Internal Medicine

## 2013-11-19 ENCOUNTER — Ambulatory Visit (INDEPENDENT_AMBULATORY_CARE_PROVIDER_SITE_OTHER): Payer: Medicare Other | Admitting: Internal Medicine

## 2013-11-19 VITALS — BP 138/88 | HR 74 | Temp 98.1°F | Ht 63.0 in | Wt 150.2 lb

## 2013-11-19 DIAGNOSIS — H9190 Unspecified hearing loss, unspecified ear: Secondary | ICD-10-CM | POA: Diagnosis not present

## 2013-11-19 DIAGNOSIS — R7309 Other abnormal glucose: Secondary | ICD-10-CM

## 2013-11-19 DIAGNOSIS — R7302 Impaired glucose tolerance (oral): Secondary | ICD-10-CM | POA: Insufficient documentation

## 2013-11-19 DIAGNOSIS — H9193 Unspecified hearing loss, bilateral: Secondary | ICD-10-CM

## 2013-11-19 DIAGNOSIS — E785 Hyperlipidemia, unspecified: Secondary | ICD-10-CM | POA: Diagnosis not present

## 2013-11-19 DIAGNOSIS — I10 Essential (primary) hypertension: Secondary | ICD-10-CM | POA: Diagnosis not present

## 2013-11-19 MED ORDER — AMLODIPINE BESYLATE 10 MG PO TABS: 10.0000 mg | ORAL_TABLET | Freq: Every day | ORAL | Status: DC

## 2013-11-19 NOTE — Patient Instructions (Addendum)
Please continue all other medications as before, and refills have been done if requested. Please have the pharmacy call with any other refills you may need.  Please continue your efforts at being more active, low cholesterol diet, and weight control.  You are otherwise up to date with prevention measures today.  Please keep your appointments with your specialists as you may have planned  We can hold on further lab tests today  Please return in 1 year for your yearly visit, or sooner if needed

## 2013-11-19 NOTE — Assessment & Plan Note (Signed)
stable overall by history and exam, recent data reviewed with pt, and pt to continue medical treatment as before,  to f/u any worsening symptoms or concerns Lab Results  Component Value Date   [facility]  Value: 105        Total Cholesterol/HDL:CHD Risk Coronary Heart Disease Risk Table                     Men   Women  1/2 Average Risk   3.4   3.3  Average Risk       5.0   4.4  2 X Average Risk   9.6   7.1  3 X Average Risk  23.4   11.0        Use the calculated Patient Ratio above and the CHD Risk Table to determine the patient's CHD Risk.        ATP III CLASSIFICATION (LDL):  <100     mg/dL   Optimal  100-129  mg/dL   Near or Above                    Optimal  130-159  mg/dL   Borderline  160-189  mg/dL   High  >190     mg/dL   Very High* 01/05/2010

## 2013-11-19 NOTE — Progress Notes (Signed)
Subjective:    Patient ID: Briana Schroeder, female    DOB: 1946/04/07, 68 y.o.   MRN: [id number]  HPI  Here for yearly f/u;  Overall doing ok;  Pt denies CP, worsening SOB, DOE, wheezing, orthopnea, PND, worsening LE edema, palpitations, dizziness or syncope.  Pt denies neurological change such as new headache, facial or extremity weakness.  Pt denies polydipsia, polyuria, or low sugar symptoms. Pt states overall good compliance with treatment and medications, good tolerability, and has been trying to follow lower cholesterol diet.  Pt denies worsening depressive symptoms, suicidal ideation or panic. No fever, night sweats, wt loss, loss of appetite, or other constitutional symptoms.  Pt states good ability with ADL's, has low fall risk, home safety reviewed and adequate, no other significant changes in vision, and only occasionally active with exercise.   Has had worsening hearing in the past wk, turning up the TV loud, husband complaining Past Medical History  Diagnosis Date  . ALLERGIC RHINITIS 08/02/2009  . ANXIETY 10/18/2008  . ARM PAIN, RIGHT 04/04/2009  . BACK PAIN 08/02/2009  . CHEST PAIN 12/05/2009  . COLONIC POLYPS, HX OF 10/18/2008  . DIZZINESS 12/05/2009  . EPIGASTRIC PAIN 01/09/2010  . FATIGUE 09/06/2009  . GERD 12/06/2009  . HOARSENESS 09/06/2009  . HYPERLIPIDEMIA 10/18/2008  . IBS 10/18/2008  . MYALGIA 03/21/2009  . PULMONARY SARCOIDOSIS 10/18/2008  . SINUSITIS- ACUTE-NOS 03/21/2009  . VOCAL CORD NODULE 09/06/2009  . HYPERTENSION 10/18/2008  . Osteopenia     -1.06 Jul 2009  . Fibroid    Past Surgical History  Procedure Laterality Date  . S/p right finger tendon surgury    . Hx of vocal cord nodules    . Abdominal hysterectomy  08/1992    TAH-DR G FOR FIBROIDS AND MENORRHAGIA    reports that she has never smoked. She does not have any smokeless tobacco history on file. She reports that she does not drink alcohol. Her drug history is not on file. family history includes Breast cancer in  her mother; Diabetes in her mother; Hypertension in her mother and sister. Allergies  Allergen Reactions  . Sertraline Hcl Diarrhea   Current Outpatient Prescriptions on File Prior to Visit  Medication Sig Dispense Refill  . amitriptyline (ELAVIL) 50 MG tablet Take 1 tablet (50 mg total) by mouth at bedtime.  90 tablet  1  . aspirin 81 MG tablet Take 81 mg by mouth daily.        . famotidine (PEPCID) 20 MG tablet Take 1 tablet (20 mg total) by mouth 2 (two) times daily as needed for heartburn or indigestion.  60 tablet  1  . hydrochlorothiazide (HYDRODIURIL) 25 MG tablet TAKE ONE TABLET BY MOUTH ONE TIME DAILY  90 tablet  3  . hydrOXYzine (ATARAX/VISTARIL) 25 MG tablet Take 1 tablet (25 mg total) by mouth 3 (three) times daily as needed.  90 tablet  1  . mometasone (NASONEX) 50 MCG/ACT nasal spray Place 2 sprays into the nose daily.  17 g  2  . Multiple Vitamin (MULTIVITAMIN) tablet Take 1 tablet by mouth daily.      . potassium chloride (K-DUR) 10 MEQ tablet TAKE 1 TABLET BY MOUTH DAILY  90 tablet  3  . fluticasone (FLONASE) 50 MCG/ACT nasal spray Place 2 sprays into the nose daily.  16 g  3   No current facility-administered medications on file prior to visit.   Review of Systems Constitutional: Negative for increased diaphoresis, other activity, appetite or  other siginficant weight change  HENT: Negative for worsening hearing loss, ear pain, facial swelling, mouth sores and neck stiffness.   Eyes: Negative for other worsening pain, redness or visual disturbance.  Respiratory: Negative for shortness of breath and wheezing.   Cardiovascular: Negative for chest pain and palpitations.  Gastrointestinal: Negative for diarrhea, blood in stool, abdominal distention or other pain Genitourinary: Negative for hematuria, flank pain or change in urine volume.  Musculoskeletal: Negative for myalgias or other joint complaints.  Skin: Negative for color change and wound.  Neurological: Negative for  syncope and numbness. other than noted Hematological: Negative for adenopathy. or other swelling Psychiatric/Behavioral: Negative for hallucinations, self-injury, decreased concentration or other worsening agitation.      Objective:   Physical Exam BP 138/88  Pulse 74  Temp(Src) 98.1 F (36.7 C) (Oral)  Ht 5\' 3"  (1.6 m)  Wt 150 lb 4 oz (68.153 kg)  BMI 26.62 kg/m2  SpO2 94% VS noted,  Constitutional: Pt is oriented to person, place, and time. Appears well-developed and well-nourished.  Head: Normocephalic and atraumatic.  Right Ear: External ear normal.  Left Ear: External ear normal.  Nose: Nose normal.  Mouth/Throat: Oropharynx is clear and moist.  TM's clear after bilat wax impactions irrigated Eyes: Conjunctivae and EOM are normal. Pupils are equal, round, and reactive to light.  Neck: Normal range of motion. Neck supple. No JVD present. No tracheal deviation present.  Cardiovascular: Normal rate, regular rhythm, normal heart sounds and intact distal pulses.   Pulmonary/Chest: Effort normal and breath sounds without rales or wheezing  Abdominal: Soft. Bowel sounds are normal. NT. No HSM  Musculoskeletal: Normal range of motion. Exhibits no edema.  Lymphadenopathy:  Has no cervical adenopathy.  Neurological: Pt is alert and oriented to person, place, and time. Pt has normal reflexes. No cranial nerve deficit. Motor grossly intact Skin: Skin is warm and dry. No rash noted.  Psychiatric:  Has normal mood and affect. Behavior is normal.      Assessment & Plan:

## 2013-11-19 NOTE — Progress Notes (Signed)
Pre visit review using our clinic review tool, if applicable. No additional management support is needed unless otherwise documented below in the visit note. 

## 2013-11-19 NOTE — Assessment & Plan Note (Signed)
stable overall by history and exam, recent data reviewed with pt, and pt to continue medical treatment as before,  to f/u any worsening symptoms or concerns BP Readings from Last 3 Encounters:  11/19/13 138/88  07/21/13 130/80  05/21/13 142/90

## 2013-11-19 NOTE — Assessment & Plan Note (Signed)
Improved with bilat irrigation

## 2013-11-19 NOTE — Assessment & Plan Note (Signed)
stable overall by history and exam, recent data reviewed with pt, and pt to continue medical treatment as before,  to f/u any worsening symptoms or concerns Lab Results  Component Value Date   HGBA1C 6.4 07/29/2013

## 2013-11-19 NOTE — Assessment & Plan Note (Signed)

## 2014-02-12 ENCOUNTER — Ambulatory Visit (INDEPENDENT_AMBULATORY_CARE_PROVIDER_SITE_OTHER): Payer: Medicare Other | Admitting: Internal Medicine

## 2014-02-12 ENCOUNTER — Encounter: Payer: Self-pay | Admitting: Internal Medicine

## 2014-02-12 VITALS — BP 134/80 | HR 71 | Temp 98.0°F | Ht 63.0 in | Wt 155.0 lb

## 2014-02-12 DIAGNOSIS — J069 Acute upper respiratory infection, unspecified: Secondary | ICD-10-CM | POA: Diagnosis not present

## 2014-02-12 DIAGNOSIS — J309 Allergic rhinitis, unspecified: Secondary | ICD-10-CM | POA: Diagnosis not present

## 2014-02-12 DIAGNOSIS — J383 Other diseases of vocal cords: Secondary | ICD-10-CM | POA: Diagnosis not present

## 2014-02-12 DIAGNOSIS — I10 Essential (primary) hypertension: Secondary | ICD-10-CM | POA: Diagnosis not present

## 2014-02-12 MED ORDER — CETIRIZINE HCL 10 MG PO TABS: 10.0000 mg | ORAL_TABLET | Freq: Every day | ORAL | Status: DC

## 2014-02-12 MED ORDER — LEVOFLOXACIN 250 MG PO TABS: 250.0000 mg | ORAL_TABLET | Freq: Every day | ORAL | Status: DC

## 2014-02-12 NOTE — Progress Notes (Signed)
Pre visit review using our clinic review tool, if applicable. No additional management support is needed unless otherwise documented below in the visit note. 

## 2014-02-12 NOTE — Patient Instructions (Signed)
Please take all new medication as prescribed  Please continue all other medications as before, and refills have been done if requested.  Please have the pharmacy call with any other refills you may need.  Please continue your efforts at being more active, low cholesterol diet, and weight control.  You are otherwise up to date with prevention measures today.  Please keep your appointments with your specialists as you may have planned   

## 2014-02-13 DIAGNOSIS — J069 Acute upper respiratory infection, unspecified: Secondary | ICD-10-CM | POA: Insufficient documentation

## 2014-02-13 NOTE — Progress Notes (Signed)
Subjective:    Patient ID: Briana Schroeder, female    DOB: [birth year], 68 y.o.   MRN: [id number]  HPI   Here with 2-3 days acute onset fever, facial pain, pressure, headache, general weakness and malaise, and greenish d/c, with mild ST and cough, but pt denies chest pain, wheezing, increased sob or doe, orthopnea, PND, increased LE swelling, palpitations, dizziness or syncope. Does have several wks ongoing nasal allergy symptoms with clearish congestion, itch and sneezing, without fever, pain, ST, cough, swelling or wheezing. Also with bilat ear fullness and popping with all the congestion apparently.  Also with raspy voice with above, concerned about recurrent vocal cord issue Past Medical History  Diagnosis Date  . ALLERGIC RHINITIS 08/02/2009  . ANXIETY 10/18/2008  . ARM PAIN, RIGHT 04/04/2009  . BACK PAIN 08/02/2009  . CHEST PAIN 12/05/2009  . COLONIC POLYPS, HX OF 10/18/2008  . DIZZINESS 12/05/2009  . EPIGASTRIC PAIN 01/09/2010  . FATIGUE 09/06/2009  . GERD 12/06/2009  . HOARSENESS 09/06/2009  . HYPERLIPIDEMIA 10/18/2008  . IBS 10/18/2008  . MYALGIA 03/21/2009  . PULMONARY SARCOIDOSIS 10/18/2008  . SINUSITIS- ACUTE-NOS 03/21/2009  . VOCAL CORD NODULE 09/06/2009  . HYPERTENSION 10/18/2008  . Osteopenia     -1.06 Jul 2009  . Fibroid    Past Surgical History  Procedure Laterality Date  . S/p right finger tendon surgury    . Hx of vocal cord nodules    . Abdominal hysterectomy  08/1992    TAH-DR G FOR FIBROIDS AND MENORRHAGIA    reports that she has never smoked. She does not have any smokeless tobacco history on file. She reports that she does not drink alcohol. Her drug history is not on file. family history includes Breast cancer in her mother; Diabetes in her mother; Hypertension in her mother and sister. Allergies  Allergen Reactions  . Sertraline Hcl Diarrhea   Current Outpatient Prescriptions on File Prior to Visit  Medication Sig Dispense Refill  . amitriptyline (ELAVIL) 50 MG tablet  Take 1 tablet (50 mg total) by mouth at bedtime.  90 tablet  1  . amLODipine (NORVASC) 10 MG tablet Take 1 tablet (10 mg total) by mouth daily.  90 tablet  3  . aspirin 81 MG tablet Take 81 mg by mouth daily.        . famotidine (PEPCID) 20 MG tablet Take 1 tablet (20 mg total) by mouth 2 (two) times daily as needed for heartburn or indigestion.  60 tablet  1  . fluticasone (FLONASE) 50 MCG/ACT nasal spray Place 2 sprays into the nose daily.  16 g  3  . hydrochlorothiazide (HYDRODIURIL) 25 MG tablet TAKE ONE TABLET BY MOUTH ONE TIME DAILY  90 tablet  3  . hydrOXYzine (ATARAX/VISTARIL) 25 MG tablet Take 1 tablet (25 mg total) by mouth 3 (three) times daily as needed.  90 tablet  1  . mometasone (NASONEX) 50 MCG/ACT nasal spray Place 2 sprays into the nose daily.  17 g  2  . Multiple Vitamin (MULTIVITAMIN) tablet Take 1 tablet by mouth daily.      . potassium chloride (K-DUR) 10 MEQ tablet TAKE 1 TABLET BY MOUTH DAILY  90 tablet  3   No current facility-administered medications on file prior to visit.   Review of Systems  Constitutional: Negative for unusual diaphoresis or other sweats  HENT: Negative for ringing in ear Eyes: Negative for double vision or worsening visual disturbance.  Respiratory: Negative for choking and stridor.  Gastrointestinal: Negative for vomiting or other signifcant bowel change Genitourinary: Negative for hematuria or decreased urine volume.  Musculoskeletal: Negative for other MSK pain or swelling Skin: Negative for color change and worsening wound.  Neurological: Negative for tremors and numbness other than noted  Psychiatric/Behavioral: Negative for decreased concentration or agitation other than above       Objective:   Physical Exam BP 134/80  Pulse 71  Temp(Src) 98 F (36.7 C) (Oral)  Ht 5\' 3"  (1.6 m)  Wt 155 lb (70.308 kg)  BMI 27.46 kg/m2  SpO2 99% VS noted, mild ill Constitutional: Pt appears well-developed, well-nourished.  HENT: Head: NCAT.    Right Ear: External ear normal.  Left Ear: External ear normal.  Eyes: . Pupils are equal, round, and reactive to light. Conjunctivae and EOM are normal Bilat tm's with mild erythema.  Max sinus areas non tender.  Pharynx with mild erythema, no exudate Neck: Normal range of motion. Neck supple.  Cardiovascular: Normal rate and regular rhythm.   Pulmonary/Chest: Effort normal and breath sounds normal.  Neurological: Pt is alert. Not confused , motor grossly intact Skin: Skin is warm. No rash Psychiatric: Pt behavior is normal. No agitation.     Assessment & Plan:

## 2014-02-13 NOTE — Assessment & Plan Note (Signed)
Voice change current most likely related to infectiuos process,  to f/u any worsening symptoms or concerns, consider ENT for persistent voice change

## 2014-02-13 NOTE — Assessment & Plan Note (Signed)
stable overall by history and exam, recent data reviewed with pt, and pt to continue medical treatment as before,  to f/u any worsening symptoms or concerns BP Readings from Last 3 Encounters:  02/12/14 134/80  11/19/13 138/88  07/21/13 130/80

## 2014-02-13 NOTE — Assessment & Plan Note (Signed)
Mild to mod, for antibx course,  to f/u any worsening symptoms or concerns 

## 2014-02-13 NOTE — Assessment & Plan Note (Signed)
Uncontrolled, to add zyrtec qd prn,  to f/u any worsening symptoms or concerns

## 2014-05-03 ENCOUNTER — Encounter: Payer: Self-pay | Admitting: Internal Medicine

## 2014-06-21 ENCOUNTER — Other Ambulatory Visit: Payer: Self-pay | Admitting: Internal Medicine

## 2014-07-07 ENCOUNTER — Other Ambulatory Visit: Payer: Self-pay | Admitting: Internal Medicine

## 2014-09-14 ENCOUNTER — Telehealth: Payer: Self-pay

## 2014-09-14 NOTE — Telephone Encounter (Signed)
Pt stated that she declined the flu shot this season.

## 2014-10-03 ENCOUNTER — Other Ambulatory Visit: Payer: Self-pay | Admitting: Internal Medicine

## 2014-10-04 ENCOUNTER — Encounter: Payer: Self-pay | Admitting: Gastroenterology

## 2014-11-22 ENCOUNTER — Other Ambulatory Visit: Payer: Self-pay | Admitting: Internal Medicine

## 2014-11-25 ENCOUNTER — Encounter: Payer: Medicare Other | Admitting: Internal Medicine

## 2014-11-29 ENCOUNTER — Emergency Department (HOSPITAL_COMMUNITY): Payer: Medicare Other

## 2014-11-29 ENCOUNTER — Encounter (HOSPITAL_COMMUNITY): Payer: Self-pay | Admitting: Nurse Practitioner

## 2014-11-29 ENCOUNTER — Emergency Department (HOSPITAL_COMMUNITY)
Admission: EM | Admit: 2014-11-29 | Discharge: 2014-11-30 | Disposition: A | Discharge status: Home / Self Care | Payer: Medicare Other | Attending: Emergency Medicine | Admitting: Emergency Medicine

## 2014-11-29 DIAGNOSIS — E785 Hyperlipidemia, unspecified: Secondary | ICD-10-CM | POA: Diagnosis not present

## 2014-11-29 DIAGNOSIS — M25511 Pain in right shoulder: Secondary | ICD-10-CM | POA: Diagnosis not present

## 2014-11-29 DIAGNOSIS — Z86018 Personal history of other benign neoplasm: Secondary | ICD-10-CM | POA: Insufficient documentation

## 2014-11-29 DIAGNOSIS — Z90710 Acquired absence of both cervix and uterus: Secondary | ICD-10-CM | POA: Diagnosis not present

## 2014-11-29 DIAGNOSIS — Z79899 Other long term (current) drug therapy: Secondary | ICD-10-CM | POA: Insufficient documentation

## 2014-11-29 DIAGNOSIS — I10 Essential (primary) hypertension: Secondary | ICD-10-CM | POA: Insufficient documentation

## 2014-11-29 DIAGNOSIS — R1011 Right upper quadrant pain: Secondary | ICD-10-CM | POA: Diagnosis present

## 2014-11-29 DIAGNOSIS — Z862 Personal history of diseases of the blood and blood-forming organs and certain disorders involving the immune mechanism: Secondary | ICD-10-CM | POA: Diagnosis not present

## 2014-11-29 DIAGNOSIS — Z8709 Personal history of other diseases of the respiratory system: Secondary | ICD-10-CM | POA: Insufficient documentation

## 2014-11-29 DIAGNOSIS — R0782 Intercostal pain: Secondary | ICD-10-CM | POA: Diagnosis not present

## 2014-11-29 DIAGNOSIS — F419 Anxiety disorder, unspecified: Secondary | ICD-10-CM | POA: Insufficient documentation

## 2014-11-29 DIAGNOSIS — Z7951 Long term (current) use of inhaled steroids: Secondary | ICD-10-CM | POA: Diagnosis not present

## 2014-11-29 DIAGNOSIS — R350 Frequency of micturition: Secondary | ICD-10-CM | POA: Diagnosis not present

## 2014-11-29 DIAGNOSIS — Z7982 Long term (current) use of aspirin: Secondary | ICD-10-CM | POA: Diagnosis not present

## 2014-11-29 DIAGNOSIS — Z8719 Personal history of other diseases of the digestive system: Secondary | ICD-10-CM | POA: Diagnosis not present

## 2014-11-29 DIAGNOSIS — R079 Chest pain, unspecified: Secondary | ICD-10-CM | POA: Diagnosis not present

## 2014-11-29 DIAGNOSIS — Z8742 Personal history of other diseases of the female genital tract: Secondary | ICD-10-CM | POA: Insufficient documentation

## 2014-11-29 LAB — URINALYSIS, ROUTINE W REFLEX MICROSCOPIC
Bilirubin Urine: NEGATIVE
Glucose, UA: NEGATIVE mg/dL
Hgb urine dipstick: NEGATIVE
Ketones, ur: NEGATIVE mg/dL
Leukocytes, UA: NEGATIVE
Nitrite: NEGATIVE
PROTEIN: NEGATIVE mg/dL
SPECIFIC GRAVITY, URINE: 1.007 (ref 1.005–1.030)
Urobilinogen, UA: 0.2 mg/dL (ref 0.0–1.0)
pH: 6 (ref 5.0–8.0)

## 2014-11-29 LAB — COMPREHENSIVE METABOLIC PANEL
ALT: 28 U/L (ref 14–54)
ANION GAP: 9 (ref 5–15)
AST: 51 U/L — AB (ref 15–41)
Albumin: 3.7 g/dL (ref 3.5–5.0)
Alkaline Phosphatase: 22 U/L — ABNORMAL LOW (ref 38–126)
BUN: 21 mg/dL — ABNORMAL HIGH (ref 6–20)
CHLORIDE: 99 mmol/L — AB (ref 101–111)
CO2: 27 mmol/L (ref 22–32)
Calcium: 8.9 mg/dL (ref 8.9–10.3)
Creatinine, Ser: 0.76 mg/dL (ref 0.44–1.00)
Glucose, Bld: 121 mg/dL — ABNORMAL HIGH (ref 65–99)
Potassium: 5.1 mmol/L (ref 3.5–5.1)
SODIUM: 135 mmol/L (ref 135–145)
TOTAL PROTEIN: 7.2 g/dL (ref 6.5–8.1)
Total Bilirubin: 1.4 mg/dL — ABNORMAL HIGH (ref 0.3–1.2)

## 2014-11-29 LAB — CBC WITH DIFFERENTIAL/PLATELET
Basophils Absolute: 0 10*3/uL (ref 0.0–0.1)
Basophils Relative: 1 % (ref 0–1)
EOS PCT: 2 % (ref 0–5)
Eosinophils Absolute: 0.2 10*3/uL (ref 0.0–0.7)
HEMATOCRIT: 36.3 % (ref 36.0–46.0)
Hemoglobin: 12.4 g/dL (ref 12.0–15.0)
Lymphocytes Relative: 19 % (ref 12–46)
Lymphs Abs: 1.4 10*3/uL (ref 0.7–4.0)
MCH: 30.4 pg (ref 26.0–34.0)
MCHC: 34.2 g/dL (ref 30.0–36.0)
MCV: 89 fL (ref 78.0–100.0)
Monocytes Absolute: 0.8 10*3/uL (ref 0.1–1.0)
Monocytes Relative: 11 % (ref 3–12)
Neutro Abs: 4.8 10*3/uL (ref 1.7–7.7)
Neutrophils Relative %: 67 % (ref 43–77)
Platelets: 280 10*3/uL (ref 150–400)
RBC: 4.08 MIL/uL (ref 3.87–5.11)
RDW: 14.2 % (ref 11.5–15.5)
WBC: 7.2 10*3/uL (ref 4.0–10.5)

## 2014-11-29 LAB — LIPASE, BLOOD: Lipase: 35 U/L (ref 22–51)

## 2014-11-29 LAB — I-STAT TROPONIN, ED: TROPONIN I, POC: 0 ng/mL (ref 0.00–0.08)

## 2014-11-29 NOTE — ED Notes (Signed)
Pt is c/o RUQ pain that she describes as a dull discomfort rating it 4/10, also states she has right shoulder pain at radiates to the scapula area. Denies n/v/d fevers or chills. Denies any pertinent GI hx.

## 2014-11-29 NOTE — ED Provider Notes (Signed)
CSN: [id number]     Arrival date & time 11/29/14  2104 History   First MD Initiated Contact with Patient 11/29/14 2326     Chief Complaint  Patient presents with  . Abdominal Pain  . Shoulder Pain   Briana Schroeder is a 69 y.o. female with a history of GERD, irritable bowel syndrome, abdominal hysterectomy, hypertension, and hyperlipidemia who presents to the emergency department complaining of right sided shoulder pain and right upper quadrant abdominal pain since last night. She reports that when she initially arrived to the emergency department she was having right upper quadrant abdominal pain. She reports that after arrival to the emergency department her pain in her right upper quadrant has resolved. She now complains of right chest pain beneath her right breast that radiates into her right scapula and into her right arm. She rates her pain an 8 out of 10 and describes it as sharp and achy. She reports her pain is worse with movement of her right arm. Patient also complains of urinary frequency since tonight. She denies other urinary symptoms. The patient denies fevers, chills, nausea, vomiting, diarrhea, constipation, hematochezia, hemoptysis, cough, shortness of breath, palpitations, leg pain, leg swelling, numbness, tingling or weakness. The patient denies trauma to her right arm or shoulder. The patient denies smoking, recent surgeries or recent long travel. The patient denies personal or close family history of MI. Patient denies personal or close family history of DVTs or PEs. The patient denies personal or close family history of blood clotting disorders such as factor V Leiden, protein C or S deficiency.   (Consider location/radiation/quality/duration/timing/severity/associated sxs/prior Treatment) HPI  Past Medical History  Diagnosis Date  . ALLERGIC RHINITIS 08/02/2009  . ANXIETY 10/18/2008  . ARM PAIN, RIGHT 04/04/2009  . BACK PAIN 08/02/2009  . CHEST PAIN 12/05/2009  . COLONIC  POLYPS, HX OF 10/18/2008  . DIZZINESS 12/05/2009  . EPIGASTRIC PAIN 01/09/2010  . FATIGUE 09/06/2009  . GERD 12/06/2009  . HOARSENESS 09/06/2009  . HYPERLIPIDEMIA 10/18/2008  . IBS 10/18/2008  . MYALGIA 03/21/2009  . PULMONARY SARCOIDOSIS 10/18/2008  . SINUSITIS- ACUTE-NOS 03/21/2009  . VOCAL CORD NODULE 09/06/2009  . HYPERTENSION 10/18/2008  . Osteopenia     -1.06 Jul 2009  . Fibroid    Past Surgical History  Procedure Laterality Date  . S/p right finger tendon surgury    . Hx of vocal cord nodules    . Abdominal hysterectomy  08/1992    TAH-DR G FOR FIBROIDS AND MENORRHAGIA   Family History  Problem Relation Age of Onset  . Diabetes Mother   . Hypertension Mother   . Breast cancer Mother   . Hypertension Sister    History  Substance Use Topics  . Smoking status: Never Smoker   . Smokeless tobacco: Not on file  . Alcohol Use: No   OB History    Gravida Para Term Preterm AB TAB SAB Ectopic Multiple Living   2 2        2      Review of Systems  Constitutional: Negative for fever, chills and appetite change.  HENT: Negative for congestion, ear pain and sore throat.   Eyes: Negative for pain and visual disturbance.  Respiratory: Negative for cough, shortness of breath and wheezing.   Cardiovascular: Positive for chest pain. Negative for palpitations and leg swelling.  Gastrointestinal: Positive for abdominal pain. Negative for nausea, vomiting, diarrhea, constipation and blood in stool.  Genitourinary: Positive for frequency. Negative for dysuria, urgency, hematuria, flank  pain and difficulty urinating.  Musculoskeletal: Negative for back pain and neck pain.       Right shoulder pain  Skin: Negative for rash.  Neurological: Negative for dizziness, syncope, weakness, light-headedness, numbness and headaches.      Allergies  Sertraline hcl  Home Medications   Prior to Admission medications   Medication Sig Start Date End Date Taking? Authorizing Provider  amLODipine (NORVASC)  10 MG tablet TAKE ONE TABLET BY MOUTH ONCE DAILY 11/22/14  Yes Biagio Borg, MD  aspirin 81 MG tablet Take 81 mg by mouth daily.     Yes Historical Provider, MD  Biotin 5000 MCG TABS Take 1 tablet by mouth daily.   Yes Historical Provider, MD  Dextromethorphan HBr (VICKS DAYQUIL COUGH PO) Take 30 mLs by mouth daily as needed (cough).   Yes Historical Provider, MD  dextromethorphan-guaiFENesin (MUCINEX DM) 30-600 MG per 12 hr tablet Take 1 tablet by mouth 2 (two) times daily as needed for cough (cough).   Yes Historical Provider, MD  diphenhydrAMINE (BENADRYL) 25 MG tablet Take 25 mg by mouth every 6 (six) hours as needed for itching (itching).   Yes Historical Provider, MD  hydrochlorothiazide (HYDRODIURIL) 25 MG tablet TAKE ONE TABLET BY MOUTH ONCE DAILY 10/04/14  Yes Biagio Borg, MD  KLOR-CON M10 10 MEQ tablet TAKE ONE TABLET BY MOUTH ONCE DAILY 07/07/14  Yes Biagio Borg, MD  Multiple Vitamin (MULTIVITAMIN) tablet Take 1 tablet by mouth daily.   Yes Historical Provider, MD  VITAMIN D, CHOLECALCIFEROL, PO Take 5,000 Units by mouth daily.   Yes Historical Provider, MD  amitriptyline (ELAVIL) 50 MG tablet Take 1 tablet (50 mg total) by mouth at bedtime. Patient not taking: Reported on 11/29/2014 07/28/13   Biagio Borg, MD  cetirizine (ZYRTEC) 10 MG tablet Take 1 tablet (10 mg total) by mouth daily. Patient not taking: Reported on 11/29/2014 02/12/14   Biagio Borg, MD  famotidine (PEPCID) 20 MG tablet Take 1 tablet (20 mg total) by mouth 2 (two) times daily as needed for heartburn or indigestion. Patient not taking: Reported on 11/29/2014 07/22/13   Biagio Borg, MD  fluticasone [facility]) 50 MCG/ACT nasal spray Place 2 sprays into the nose daily. Patient not taking: Reported on 11/29/2014 05/19/12   Biagio Borg, MD  HYDROcodone-acetaminophen (NORCO/VICODIN) 5-325 MG per tablet Take 1 tablet by mouth every 6 (six) hours as needed. 11/30/14   Waynetta Pean, PA-C  hydrOXYzine (ATARAX/VISTARIL) 25 MG tablet  Take 1 tablet (25 mg total) by mouth 3 (three) times daily as needed. Patient not taking: Reported on 11/29/2014 07/28/13   Biagio Borg, MD  levofloxacin (LEVAQUIN) 250 MG tablet Take 1 tablet (250 mg total) by mouth daily. Patient not taking: Reported on 11/29/2014 02/12/14   Biagio Borg, MD  mometasone (NASONEX) 50 MCG/ACT nasal spray Place 2 sprays into the nose daily. Patient not taking: Reported on 11/29/2014 11/17/12   Biagio Borg, MD  potassium chloride (K-DUR) 10 MEQ tablet TAKE 1 TABLET BY MOUTH DAILY Patient not taking: Reported on 11/29/2014 08/14/13   Biagio Borg, MD   BP 138/81 mmHg  Pulse 72  Temp(Src) 98.1 F (36.7 C) (Oral)  Resp 18  SpO2 100% Physical Exam  Constitutional: She is oriented to person, place, and time. She appears well-developed and well-nourished. No distress.  Nontoxic appearing.  HENT:  Head: Normocephalic and atraumatic.  Right Ear: External ear normal.  Left Ear: External ear normal.  Mouth/Throat:  Oropharynx is clear and moist. No oropharyngeal exudate.  Eyes: Conjunctivae are normal. Pupils are equal, round, and reactive to light. Right eye exhibits no discharge. Left eye exhibits no discharge.  Neck: Normal range of motion. Neck supple. No JVD present. No tracheal deviation present.  Cardiovascular: Normal rate, regular rhythm, normal heart sounds and intact distal pulses.  Exam reveals no gallop and no friction rub.   No murmur heard. Bilateral radial, posterior tibialis and dorsalis pedis pulses are intact.    Pulmonary/Chest: Effort normal and breath sounds normal. No respiratory distress. She has no wheezes. She has no rales. She exhibits tenderness.  Right chest is mildly tender to palpation, but does not completely reproduce her chest pain. Lungs are clear to auscultation bilaterally. No rashes or lesion on chest. No chest deformity.   Abdominal: Soft. Bowel sounds are normal. She exhibits no distension. There is no tenderness. There is no  guarding.  Abdomen is soft and nontender to palpation. Bowel sounds are present. No McBurney's point tenderness. Negative Murphy's sign. Negative Rovsing sign. Negative psoas and obturator sign.   Musculoskeletal: Normal range of motion. She exhibits no edema or tenderness.  Full ROM of her right shoulder. No bony point tenderness. Patient has 5/5 strength to her bilateral upper extremities. No lower extremity edema or tenderness. No right shoulder deformity, edema or ecchymosis. No back midline tenderness, edema or deformity.   Lymphadenopathy:    She has no cervical adenopathy.  Neurological: She is alert and oriented to person, place, and time. Coordination normal.  Sensation is intact to her bilateral upper and lower extremities.  Skin: Skin is warm and dry. No rash noted. She is not diaphoretic. No erythema. No pallor.  Psychiatric: She has a normal mood and affect. Her behavior is normal.  Nursing note and vitals reviewed.   ED Course  Procedures (including critical care time) Labs Review Labs Reviewed  COMPREHENSIVE METABOLIC PANEL - Abnormal; Notable for the following:    Chloride 99 (*)    Glucose, Bld 121 (*)    BUN 21 (*)    AST 51 (*)    Alkaline Phosphatase 22 (*)    Total Bilirubin 1.4 (*)    All other components within normal limits  CBC WITH DIFFERENTIAL/PLATELET  LIPASE, BLOOD  URINALYSIS, ROUTINE W REFLEX MICROSCOPIC (NOT AT [facility])  I-STAT TROPOININ, ED    Imaging Review Dg Chest 2 View  11/30/2014   CLINICAL DATA:  Acute onset of right-sided chest pain and right upper quadrant abdominal pain, radiating to the right shoulder. Initial encounter.  EXAM: CHEST  2 VIEW  COMPARISON:  Chest radiograph performed 01/05/2010, and CT of the chest performed 01/06/2010  FINDINGS: The lungs are well-aerated and clear. There is no evidence of focal opacification, pleural effusion or pneumothorax.  The heart is borderline normal in size. No acute osseous abnormalities are seen.   IMPRESSION: No acute cardiopulmonary process seen.   Electronically Signed   By: Garald Balding M.D.   On: 11/30/2014 01:01     EKG Interpretation   Date/Time:  Tuesday Nov 30 2014 00:00:02 EDT Ventricular Rate:  65 PR Interval:  150 QRS Duration: 82 QT Interval:  393 QTC Calculation: 409 R Axis:   -40 Text Interpretation:  Sinus rhythm Probable left ventricular hypertrophy  No significant change since last tracing Confirmed by HARRISON  MD,  Drummond ([id number]) on 11/30/2014 12:50:28 AM      Filed Vitals:   11/29/14 2142 11/30/14 0000 11/30/14 0002 11/30/14 0100  BP:  136/76 136/76 138/81  Pulse:  72 66 72  Temp:      TempSrc:      Resp:  20 21 18   SpO2: 100% 99% 96% 100%     MDM   Meds given in ED:  Medications  HYDROcodone-acetaminophen (NORCO/VICODIN) 5-325 MG per tablet 1 tablet (1 tablet Oral Given 11/30/14 0123)    Discharge Medication List as of 11/30/2014  1:11 AM    START taking these medications   Details  HYDROcodone-acetaminophen (NORCO/VICODIN) 5-325 MG per tablet Take 1 tablet by mouth every 6 (six) hours as needed., Starting 11/30/2014, Until Discontinued, Print        Final diagnoses:  Right shoulder pain  Right upper quadrant abdominal pain   This is a 69 y.o. female with a history of GERD, irritable bowel syndrome, abdominal hysterectomy, hypertension, and hyperlipidemia who presents to the emergency department complaining of right sided shoulder pain and right upper quadrant abdominal pain since last night. She reports that when she initially arrived to the emergency department she was having right upper quadrant abdominal pain. She reports that after arrival to the emergency department her pain in her right upper quadrant has resolved. She now complains of right chest pain beneath her right breast that radiates into her right scapula and into her right arm. She rates her pain an 8 out of 10 and describes it as sharp and achy. She reports her pain is worse  with movement of her right arm.  On exam the patient is afebrile and nontoxic appearing. She has full range of motion of her right shoulder. No bony point tenderness. She is neurovascularly intact. She denies any abdominal pain and her abdomen is soft and nontender to palpation. No Murphy's sign. Patient has a negative troponin. CBC is within normal limits. CMP indicates mildly elevated AST at 51 and a bilirubin of 1.4. She has a normal lipase. Urinalysis is negative for infection. Chest x-ray is unremarkable. At reevaluation the patient reports only having right shoulder pain and denies chest pain or abdominal pain. Her abdomen is still nontender to palpation. I discussed this patient with Dr. Aline Brochure who also evaluated the patient and since she is not having any current abdominal pain will have her follow up with her PCP this week and send her home with pain medication. I advised the patient to follow-up with their primary care provider this week. I advised the patient to return to the emergency department with new or worsening symptoms or new concerns. The patient verbalized understanding and agreement with plan.    This patient was discussed with and evaluated by Dr. Aline Brochure who agrees with assessment and plan.      Waynetta Pean, PA-C 11/30/14 [id number]  Pamella Pert, MD 11/30/14 [phone]

## 2014-11-30 ENCOUNTER — Emergency Department (HOSPITAL_COMMUNITY): Payer: Medicare Other

## 2014-11-30 DIAGNOSIS — R079 Chest pain, unspecified: Secondary | ICD-10-CM | POA: Diagnosis not present

## 2014-11-30 DIAGNOSIS — R1011 Right upper quadrant pain: Secondary | ICD-10-CM | POA: Diagnosis not present

## 2014-11-30 MED ORDER — HYDROCODONE-ACETAMINOPHEN 5-325 MG PO TABS
1.0000 | ORAL_TABLET | Freq: Once | ORAL | Status: AC
  Administered 2014-11-30: 1 via ORAL
  Filled 2014-11-30: qty 1

## 2014-11-30 MED ORDER — HYDROCODONE-ACETAMINOPHEN 5-325 MG PO TABS: 1.0000 | ORAL_TABLET | Freq: Four times a day (QID) | ORAL | Status: DC | PRN

## 2014-11-30 NOTE — Discharge Instructions (Signed)
Shoulder Pain The shoulder is the joint that connects your arms to your body. The bones that form the shoulder joint include the upper arm bone (humerus), the shoulder blade (scapula), and the collarbone (clavicle). The top of the humerus is shaped like a ball and fits into a rather flat socket on the scapula (glenoid cavity). A combination of muscles and strong, fibrous tissues that connect muscles to bones (tendons) support your shoulder joint and hold the ball in the socket. Small, fluid-filled sacs (bursae) are located in different areas of the joint. They act as cushions between the bones and the overlying soft tissues and help reduce friction between the gliding tendons and the bone as you move your arm. Your shoulder joint allows a wide range of motion in your arm. This range of motion allows you to do things like scratch your back or throw a ball. However, this range of motion also makes your shoulder more prone to pain from overuse and injury. Causes of shoulder pain can originate from both injury and overuse and usually can be grouped in the following four categories:  Redness, swelling, and pain (inflammation) of the tendon (tendinitis) or the bursae (bursitis).  Instability, such as a dislocation of the joint.  Inflammation of the joint (arthritis).  Broken bone (fracture). HOME CARE INSTRUCTIONS   Apply ice to the sore area.  Put ice in a plastic bag.  Place a towel between your skin and the bag.  Leave the ice on for 15-20 minutes, 3-4 times per day for the first 2 days, or as directed by your health care provider.  Stop using cold packs if they do not help with the pain.  If you have a shoulder sling or immobilizer, wear it as long as your caregiver instructs. Only remove it to shower or bathe. Move your arm as little as possible, but keep your hand moving to prevent swelling.  Squeeze a soft ball or foam pad as much as possible to help prevent swelling.  Only take  over-the-counter or prescription medicines for pain, discomfort, or fever as directed by your caregiver. SEEK MEDICAL CARE IF:   Your shoulder pain increases, or new pain develops in your arm, hand, or fingers.  Your hand or fingers become cold and numb.  Your pain is not relieved with medicines. SEEK IMMEDIATE MEDICAL CARE IF:   Your arm, hand, or fingers are numb or tingling.  Your arm, hand, or fingers are significantly swollen or turn white or blue. MAKE SURE YOU:   Understand these instructions.  Will watch your condition.  Will get help right away if you are not doing well or get worse. Document Released: 03/28/2005 Document Revised: 11/02/2013 Document Reviewed: 06/02/2011 [facility] Patient Information 2015 [place], [place]. This information is not intended to replace advice given to you by your health care provider. Make sure you discuss any questions you have with your health care provider.  Abdominal Pain Many things can cause abdominal pain. Usually, abdominal pain is not caused by a disease and will improve without treatment. It can often be observed and treated at home. Your health care provider will do a physical exam and possibly order blood tests and X-rays to help determine the seriousness of your pain. However, in many cases, more time must pass before a clear cause of the pain can be found. Before that point, your health care provider may not know if you need more testing or further treatment. HOME CARE INSTRUCTIONS  Monitor your abdominal pain  for any changes. The following actions may help to alleviate any discomfort you are experiencing:  Only take over-the-counter or prescription medicines as directed by your health care provider.  Do not take laxatives unless directed to do so by your health care provider.  Try a clear liquid diet (broth, tea, or water) as directed by your health care provider. Slowly move to a bland diet as tolerated. SEEK MEDICAL CARE  IF:  You have unexplained abdominal pain.  You have abdominal pain associated with nausea or diarrhea.  You have pain when you urinate or have a bowel movement.  You experience abdominal pain that wakes you in the night.  You have abdominal pain that is worsened or improved by eating food.  You have abdominal pain that is worsened with eating fatty foods.  You have a fever. SEEK IMMEDIATE MEDICAL CARE IF:   Your pain does not go away within 2 hours.  You keep throwing up (vomiting).  Your pain is felt only in portions of the abdomen, such as the right side or the left lower portion of the abdomen.  You pass bloody or black tarry stools. MAKE SURE YOU:  Understand these instructions.   Will watch your condition.   Will get help right away if you are not doing well or get worse.  Document Released: 03/28/2005 Document Revised: 06/23/2013 Document Reviewed: 02/25/2013 [facility] Patient Information 2015 [place], [place]. This information is not intended to replace advice given to you by your health care provider. Make sure you discuss any questions you have with your health care provider.

## 2014-12-01 ENCOUNTER — Telehealth: Payer: Self-pay | Admitting: *Deleted

## 2014-12-01 NOTE — Telephone Encounter (Signed)
[facility] [place] - Client [place] Patient Name: Briana Schroeder Gender: Female DOB: 08-24-1945 Age: 69 Y 2 M 3 D Return Phone Number: [phone] (Primary), [phone] (Secondary) Address: City/State/Zip: [place] [address] Client [facility] - Client Client Site [place] - Night Physician [place] [place] [facility] Type Call Call Type Triage / Clinical Relationship To Patient Self Return Phone Number Please choose phone number Chief Complaint CHEST PAIN (>=21 years) - pain, pressure, heaviness or tightness Initial Comment Caller states that she has pain in her shoulder and under arm and under right breast, feels like gas, took advil PreDisposition Home Care Nurse Assessment Nurse: Cox, RN, [place] Date/Time (Eastern Time): 11/29/2014 8:37:10 PM Confirm and document reason for call. If symptomatic, describe symptoms. ---caller states she has pain in right arm, shoulder Has the patient traveled out of the country within the last 30 days? ---No Does the patient require triage? ---Yes Related visit to physician within the last 2 weeks? ---No Does the PT have any chronic conditions? (i.e. diabetes, asthma, etc.) ---Yes List chronic conditions. ---HTN, Guidelines Guideline Title Affirmed Question Affirmed Notes Nurse Date/Time Eilene Ghazi Time) Shoulder Pain [1] Age > 40 AND [2] associated chest or jaw pain AND [3] pain lasts > 5 minutes Cox, RN, [facility] 4/65/6812 7:51:70 PM Disp. Time Eilene Ghazi Time) Disposition Final User 11/29/2014 8:35:30 PM Send to Urgent Queue [place] November 11/29/2014 8:43:12 PM Go to ED Now Yes Cox, RN, [facility] Caller Understands: Yes Disagree/Comply: Comply PLEASE NOTE: All timestamps contained within this report are represented as [place] Standard Time. CONFIDENTIALTY NOTICE: This fax transmission is intended only for the addressee. It contains information  that is legally privileged, confidential or otherwise protected from use or disclosure. If you are not the intended recipient, you are strictly prohibited from reviewing, disclosing, copying using or disseminating any of this information or taking any action in reliance on or regarding this information. If you have received this fax in error, please notify us immediately by telephone so that we can arrange for its return to [name]. Phone: [phone], Toll-Free: [phone], Fax: [phone] Page: 2 of 2 Call Id: [id number] Care Advice Given Per Guideline GO TO ED NOW: You need to be seen in the Emergency Department. Go to the ER at ___________ [place] now. Drive carefully. NOTE TO TRIAGER - DRIVING: * Another adult should drive. BRING MEDICINES: * Please bring a list of your current medicines when you go to the Emergency Department (ER). CARE ADVICE given per Shoulder Pain (Adult) guideline After Care Instructions Given Call Event Type User Date / Time Description Referrals [facility] - ED

## 2014-12-02 ENCOUNTER — Other Ambulatory Visit (INDEPENDENT_AMBULATORY_CARE_PROVIDER_SITE_OTHER): Payer: Medicare Other

## 2014-12-02 ENCOUNTER — Other Ambulatory Visit: Payer: Self-pay | Admitting: Internal Medicine

## 2014-12-02 ENCOUNTER — Ambulatory Visit (INDEPENDENT_AMBULATORY_CARE_PROVIDER_SITE_OTHER): Payer: Medicare Other | Admitting: Internal Medicine

## 2014-12-02 ENCOUNTER — Encounter: Payer: Self-pay | Admitting: Internal Medicine

## 2014-12-02 VITALS — BP 128/80 | HR 81 | Temp 98.2°F | Resp 16 | Wt 147.0 lb

## 2014-12-02 DIAGNOSIS — M791 Myalgia: Secondary | ICD-10-CM

## 2014-12-02 DIAGNOSIS — M25519 Pain in unspecified shoulder: Secondary | ICD-10-CM | POA: Diagnosis not present

## 2014-12-02 DIAGNOSIS — M7918 Myalgia, other site: Secondary | ICD-10-CM

## 2014-12-02 DIAGNOSIS — M353 Polymyalgia rheumatica: Secondary | ICD-10-CM

## 2014-12-02 DIAGNOSIS — R748 Abnormal levels of other serum enzymes: Secondary | ICD-10-CM | POA: Insufficient documentation

## 2014-12-02 LAB — CK: Total CK: 898 U/L — ABNORMAL HIGH (ref 7–177)

## 2014-12-02 LAB — SEDIMENTATION RATE: Sed Rate: 80 mm/hr — ABNORMAL HIGH (ref 0–22)

## 2014-12-02 MED ORDER — METHOCARBAMOL 500 MG PO TABS: ORAL_TABLET | ORAL | Status: DC

## 2014-12-02 MED ORDER — PREDNISONE 10 MG PO TABS: ORAL_TABLET | ORAL | Status: DC

## 2014-12-02 NOTE — Progress Notes (Signed)
Pre visit review using our clinic review tool, if applicable. No additional management support is needed unless otherwise documented below in the visit note. 

## 2014-12-02 NOTE — Patient Instructions (Signed)
  Your next office appointment will be determined based upon review of your pending labs  and  xrays  Those written interpretation of the lab results and instructions will be transmitted to you by mail for your records.  Critical results will be called.  Use a cervical memory foam pillow to prevent hyperextension or hyperflexion of the cervical spine.Use an anti-inflammatory cream such as Aspercreme or Zostrix cream twice a day to the affected area as needed. In lieu of this warm moist compresses or  hot water bottle can be used. Do not apply ice .  Followup as needed for any active or acute issue. Please report any significant change in your symptoms.

## 2014-12-02 NOTE — Progress Notes (Signed)
   Subjective:    Patient ID: Briana Schroeder, female    DOB: 1946-06-11, 69 y.o.   MRN: [id number]  HPI She was in the emergency room 11/29/14 with right upper quadrant abdominal pain and right shoulder pain. She was describing pain in the right inframammary area which radiated to the right scapula. She also had urinary frequency. AST was 51 and total bilirubin 1.4. Troponin was negative. EKG revealed LVH with no significant changes.  The frequency has resolved and she has no other GU genitourinary or GI symptoms.  At this time her complaint is pain in the right neck to the right upper extremity as of 5/29. As of 5/31 it was on the left in the same distribution. It is described as intermittent. There was no trigger or injury. She's had no cervical surgery.  She is not on a statin.  She has no other associated neuromuscular symptoms.   Review of Systems Fever, chills, sweats, or unexplained weight loss not present. No significant headaches. Mental status change or memory loss denied. Blurred vision , diplopia or vision loss absent. Vertigo, near syncope or imbalance denied. There is no numbness, tingling, or weakness in extremities.   No loss of control of bladder or bowels. Radicular type pain absent. No seizure stigmata. Dysuria, pyuria, hematuria, frequency, nocturia or polyuria are denied. Unexplained weight loss, abdominal pain, significant dyspepsia, dysphagia, melena, rectal bleeding, or persistently small caliber stools are denied.    Objective:   Physical Exam  Pertinent or positive findings include: Decreased cervical spine range of motion because of associated pain.  Minor rales are suggested on auscultation (chest x-ray revealed no acute process in the emergency room). She has minor crepitus of the knees.  She has no cranial nerve deficit.  General appearance :Thin but adequately nourished; in no distress. Eyes: No conjunctival inflammation or scleral icterus is  present. Oral exam:  Lips and gums are healthy appearing.There is no oropharyngeal erythema or exudate noted. Dental hygiene is good. Heart:  Normal rate and regular rhythm. S1 and S2 normal without gallop, murmur, click, rub or other extra sounds   Lungs:Chest clear to auscultation; no wheezes, rhonchi,rales ,or rubs present.No increased work of breathing.  Abdomen: bowel sounds normal, soft and non-tender without masses, organomegaly or hernias noted.  No guarding or rebound.  Vascular : all pulses equal ; no bruits present. Skin:Warm & dry.  Intact without suspicious lesions or rashes ; no tenting Lymphatic: No lymphadenopathy is noted about the head, neck, axilla Neuro: Strength, tone & DTRs normal.       Assessment & Plan:  #1 bilateral neck and shoulder pain, rule out polymyalgia rheumatica. Cervical radiculopathy is not suspect in the absence of neuromuscular deficit.  Plan: See orders and recommendations

## 2014-12-09 ENCOUNTER — Other Ambulatory Visit (INDEPENDENT_AMBULATORY_CARE_PROVIDER_SITE_OTHER): Payer: Medicare Other

## 2014-12-09 DIAGNOSIS — M353 Polymyalgia rheumatica: Secondary | ICD-10-CM

## 2014-12-09 DIAGNOSIS — R748 Abnormal levels of other serum enzymes: Secondary | ICD-10-CM

## 2014-12-09 LAB — SEDIMENTATION RATE: SED RATE: 67 mm/h — AB (ref 0–22)

## 2014-12-09 LAB — CK: Total CK: 492 U/L — ABNORMAL HIGH (ref 7–177)

## 2014-12-10 ENCOUNTER — Other Ambulatory Visit: Payer: Medicare Other

## 2014-12-10 ENCOUNTER — Encounter: Payer: Self-pay | Admitting: Internal Medicine

## 2014-12-10 ENCOUNTER — Ambulatory Visit (INDEPENDENT_AMBULATORY_CARE_PROVIDER_SITE_OTHER): Payer: Medicare Other | Admitting: Internal Medicine

## 2014-12-10 VITALS — BP 122/76 | HR 66 | Temp 97.9°F | Wt 146.0 lb

## 2014-12-10 DIAGNOSIS — R7302 Impaired glucose tolerance (oral): Secondary | ICD-10-CM

## 2014-12-10 DIAGNOSIS — I10 Essential (primary) hypertension: Secondary | ICD-10-CM | POA: Diagnosis not present

## 2014-12-10 DIAGNOSIS — R700 Elevated erythrocyte sedimentation rate: Secondary | ICD-10-CM

## 2014-12-10 DIAGNOSIS — R748 Abnormal levels of other serum enzymes: Secondary | ICD-10-CM

## 2014-12-10 DIAGNOSIS — M791 Myalgia, unspecified site: Secondary | ICD-10-CM

## 2014-12-10 NOTE — Assessment & Plan Note (Signed)
With some improvement with prednisone with reduced cpk, esr, and level of discomfort; doubt viral or drug etiology, to finish prednisone, check myositis panel, and refer rheumatology

## 2014-12-10 NOTE — Assessment & Plan Note (Signed)
Asympt, ok to follow on current steroid tx Lab Results  Component Value Date   HGBA1C 6.4 07/29/2013

## 2014-12-10 NOTE — Progress Notes (Signed)
Subjective:    Patient ID: Briana Schroeder, female    DOB: Sep 07, 1945, 69 y.o.   MRN: [id number]  HPI  Here to f/u, seen in ER initially, then saw Dr Linna Darner with elevated CPK and esr, tx with prednisone with some improvement with both, as well as right shoulder and chest pain , .  5/31 cxr neg for acute. No hx of myositis, though has hx immune issue with sarcoid.  Pt denies increased sob or doe, wheezing, orthopnea, PND, increased LE swelling, palpitations, dizziness or syncope. Pt denies new neurological symptoms such as new headache, or facial or extremity weakness or numbness   No fever, or prox muscle weakness or rash.  Pt denies polydipsia, polyuria,    Past Medical History  Diagnosis Date  . ALLERGIC RHINITIS 08/02/2009  . ANXIETY 10/18/2008  . ARM PAIN, RIGHT 04/04/2009  . BACK PAIN 08/02/2009  . CHEST PAIN 12/05/2009  . COLONIC POLYPS, HX OF 10/18/2008  . DIZZINESS 12/05/2009  . EPIGASTRIC PAIN 01/09/2010  . FATIGUE 09/06/2009  . GERD 12/06/2009  . HOARSENESS 09/06/2009  . HYPERLIPIDEMIA 10/18/2008  . IBS 10/18/2008  . MYALGIA 03/21/2009  . PULMONARY SARCOIDOSIS 10/18/2008  . SINUSITIS- ACUTE-NOS 03/21/2009  . VOCAL CORD NODULE 09/06/2009  . HYPERTENSION 10/18/2008  . Osteopenia     -1.06 Jul 2009  . Fibroid    Past Surgical History  Procedure Laterality Date  . S/p right finger tendon surgury    . Hx of vocal cord nodules    . Abdominal hysterectomy  08/1992    TAH-DR G FOR FIBROIDS AND MENORRHAGIA    reports that she has never smoked. She does not have any smokeless tobacco history on file. She reports that she does not drink alcohol. Her drug history is not on file. family history includes Breast cancer in her mother; Diabetes in her mother; Hypertension in her mother and sister. Allergies  Allergen Reactions  . Sertraline Hcl Diarrhea   Current Outpatient Prescriptions on File Prior to Visit  Medication Sig Dispense Refill  . amLODipine (NORVASC) 10 MG tablet TAKE ONE TABLET BY  MOUTH ONCE DAILY 30 tablet 0  . aspirin 81 MG tablet Take 81 mg by mouth daily.      . Biotin 5000 MCG TABS Take 1 tablet by mouth daily.    Marland Kitchen Dextromethorphan HBr (VICKS DAYQUIL COUGH PO) Take 30 mLs by mouth daily as needed (cough).    Marland Kitchen dextromethorphan-guaiFENesin (MUCINEX DM) 30-600 MG per 12 hr tablet Take 1 tablet by mouth 2 (two) times daily as needed for cough (cough).    . diphenhydrAMINE (BENADRYL) 25 MG tablet Take 25 mg by mouth every 6 (six) hours as needed for itching (itching).    . famotidine (PEPCID) 20 MG tablet Take 1 tablet (20 mg total) by mouth 2 (two) times daily as needed for heartburn or indigestion. 60 tablet 1  . fluticasone (FLONASE) 50 MCG/ACT nasal spray Place 2 sprays into the nose daily. 16 g 3  . hydrochlorothiazide (HYDRODIURIL) 25 MG tablet TAKE ONE TABLET BY MOUTH ONCE DAILY 90 tablet 1  . HYDROcodone-acetaminophen (NORCO/VICODIN) 5-325 MG per tablet Take 1 tablet by mouth every 6 (six) hours as needed. 12 tablet 0  . hydrOXYzine (ATARAX/VISTARIL) 25 MG tablet Take 1 tablet (25 mg total) by mouth 3 (three) times daily as needed. 90 tablet 1  . KLOR-CON M10 10 MEQ tablet TAKE ONE TABLET BY MOUTH ONCE DAILY 90 tablet 2  . methocarbamol (ROBAXIN) 500 MG tablet  1 qhs 10 tablet 0  . mometasone (NASONEX) 50 MCG/ACT nasal spray Place 2 sprays into the nose daily. 17 g 2  . Multiple Vitamin (MULTIVITAMIN) tablet Take 1 tablet by mouth daily.    . potassium chloride (K-DUR) 10 MEQ tablet TAKE 1 TABLET BY MOUTH DAILY 90 tablet 3  . predniSONE (DELTASONE) 10 MG tablet 1 and 1/2 daily 45 tablet 0  . VITAMIN D, CHOLECALCIFEROL, PO Take 5,000 Units by mouth daily.     No current facility-administered medications on file prior to visit.    Review of Systems  Constitutional: Negative for unusual diaphoresis or night sweats HENT: Negative for ringing in ear or discharge Eyes: Negative for double vision or worsening visual disturbance.  Respiratory: Negative for choking  and stridor.   Gastrointestinal: Negative for vomiting or other signifcant bowel change Genitourinary: Negative for hematuria or change in urine volume.  Musculoskeletal: Negative for other MSK pain or swelling Skin: Negative for color change and worsening wound.  Neurological: Negative for tremors and numbness other than noted  Psychiatric/Behavioral: Negative for decreased concentration or agitation other than above       Objective:   Physical Exam BP 122/76 mmHg  Pulse 66  Temp(Src) 97.9 F (36.6 C) (Oral)  Wt 146 lb (66.225 kg)  SpO2 97% VS noted,  Constitutional: Pt appears in no significant distress HENT: Head: NCAT.  Right Ear: External ear normal.  Left Ear: External ear normal.  Eyes: . Pupils are equal, round, and reactive to light. Conjunctivae and EOM are normal Neck: Normal range of motion. Neck supple.  Cardiovascular: Normal rate and regular rhythm.   Pulmonary/Chest: Effort normal and breath sounds without rales or wheezing.  Neurological: Pt is alert. Not confused , motor grossly intact Skin: Skin is warm. No rash, no LE edema Psychiatric: Pt behavior is normal. No agitation.     Assessment & Plan:

## 2014-12-10 NOTE — Assessment & Plan Note (Signed)
stable overall by history and exam, recent data reviewed with pt, and pt to continue medical treatment as before,  to f/u any worsening symptoms or concerns BP Readings from Last 3 Encounters:  12/10/14 122/76  12/02/14 128/80  11/30/14 138/81

## 2014-12-10 NOTE — Progress Notes (Signed)
Pre visit review using our clinic review tool, if applicable. No additional management support is needed unless otherwise documented below in the visit note. 

## 2014-12-10 NOTE — Patient Instructions (Signed)
OK to finish the prednisone  Please continue all other medications as before, and refills have been done if requested.  Please have the pharmacy call with any other refills you may need.  Please keep your appointments with your specialists as you may have planned  Please go to the LAB in the Basement (turn left off the elevator) for the tests to be done today  You will be contacted by phone if any changes need to be made immediately.  Otherwise, you will receive a letter about your results with an explanation, but please check with MyChart first.  Please remember to sign up for MyChart if you have not done so, as this will be important to you in the future with finding out test results, communicating by private email, and scheduling acute appointments online when needed.  You will be contacted regarding the referral for: rheumatology

## 2014-12-15 ENCOUNTER — Ambulatory Visit (INDEPENDENT_AMBULATORY_CARE_PROVIDER_SITE_OTHER): Payer: Medicare Other | Admitting: Internal Medicine

## 2014-12-15 ENCOUNTER — Encounter: Payer: Self-pay | Admitting: Internal Medicine

## 2014-12-15 ENCOUNTER — Other Ambulatory Visit (INDEPENDENT_AMBULATORY_CARE_PROVIDER_SITE_OTHER): Payer: Medicare Other

## 2014-12-15 VITALS — BP 122/68 | HR 87 | Temp 98.3°F | Ht 63.0 in | Wt 146.0 lb

## 2014-12-15 DIAGNOSIS — R748 Abnormal levels of other serum enzymes: Secondary | ICD-10-CM | POA: Diagnosis not present

## 2014-12-15 DIAGNOSIS — H9191 Unspecified hearing loss, right ear: Secondary | ICD-10-CM | POA: Insufficient documentation

## 2014-12-15 DIAGNOSIS — Z86010 Personal history of colonic polyps: Secondary | ICD-10-CM | POA: Diagnosis not present

## 2014-12-15 DIAGNOSIS — E785 Hyperlipidemia, unspecified: Secondary | ICD-10-CM

## 2014-12-15 DIAGNOSIS — I10 Essential (primary) hypertension: Secondary | ICD-10-CM

## 2014-12-15 LAB — LIPID PANEL
CHOL/HDL RATIO: 2
CHOLESTEROL: 172 mg/dL (ref 0–200)
HDL: 70.9 mg/dL (ref >39.00)
LDL CALC: 85 mg/dL (ref 0–99)
NONHDL: 101.1
Triglycerides: 79 mg/dL (ref 0.0–149.0)
VLDL: 15.8 mg/dL (ref 0.0–40.0)

## 2014-12-15 LAB — CBC WITH DIFFERENTIAL/PLATELET
BASOS PCT: 0.4 % (ref 0.0–3.0)
Basophils Absolute: 0.1 10*3/uL (ref 0.0–0.1)
EOS ABS: 0.1 10*3/uL (ref 0.0–0.7)
EOS PCT: 0.5 % (ref 0.0–5.0)
HCT: 38.9 % (ref 36.0–46.0)
HEMOGLOBIN: 12.9 g/dL (ref 12.0–15.0)
LYMPHS PCT: 20.2 % (ref 12.0–46.0)
Lymphs Abs: 2.7 10*3/uL (ref 0.7–4.0)
MCHC: 33 g/dL (ref 30.0–36.0)
MCV: 90.6 fl (ref 78.0–100.0)
MONOS PCT: 7.3 % (ref 3.0–12.0)
Monocytes Absolute: 1 10*3/uL (ref 0.1–1.0)
Neutro Abs: 9.6 10*3/uL — ABNORMAL HIGH (ref 1.4–7.7)
Neutrophils Relative %: 71.6 % (ref 43.0–77.0)
Platelets: 407 10*3/uL — ABNORMAL HIGH (ref 150.0–400.0)
RBC: 4.29 Mil/uL (ref 3.87–5.11)
RDW: 14.8 % (ref 11.5–15.5)
WBC: 13.4 10*3/uL — ABNORMAL HIGH (ref 4.0–10.5)

## 2014-12-15 LAB — BASIC METABOLIC PANEL
BUN: 20 mg/dL (ref 6–23)
CO2: 32 meq/L (ref 19–32)
CREATININE: 1.05 mg/dL (ref 0.40–1.20)
Calcium: 10 mg/dL (ref 8.4–10.5)
Chloride: 100 mEq/L (ref 96–112)
GFR: 66.59 mL/min (ref >60.00)
GLUCOSE: 70 mg/dL (ref 70–99)
Potassium: 3.5 mEq/L (ref 3.5–5.1)
Sodium: 137 mEq/L (ref 135–145)

## 2014-12-15 LAB — URINALYSIS, ROUTINE W REFLEX MICROSCOPIC
Bilirubin Urine: NEGATIVE
Hgb urine dipstick: NEGATIVE
KETONES UR: NEGATIVE
Nitrite: NEGATIVE
PH: 6 (ref 5.0–8.0)
RBC / HPF: NONE SEEN (ref 0–?)
SPECIFIC GRAVITY, URINE: 1.025 (ref 1.000–1.030)
TOTAL PROTEIN, URINE-UPE24: NEGATIVE
URINE GLUCOSE: NEGATIVE
Urobilinogen, UA: 0.2 (ref 0.0–1.0)

## 2014-12-15 LAB — TSH: TSH: 0.62 u[IU]/mL (ref 0.35–4.50)

## 2014-12-15 LAB — HEPATIC FUNCTION PANEL
ALT: 17 U/L (ref 0–35)
AST: 17 U/L (ref 0–37)
Albumin: 3.8 g/dL (ref 3.5–5.2)
Alkaline Phosphatase: 20 U/L — ABNORMAL LOW (ref 39–117)
BILIRUBIN DIRECT: 0.1 mg/dL (ref 0.0–0.3)
TOTAL PROTEIN: 7.2 g/dL (ref 6.0–8.3)
Total Bilirubin: 0.3 mg/dL (ref 0.2–1.2)

## 2014-12-15 LAB — C-REACTIVE PROTEIN: CRP: 0.8 mg/dL (ref 0.5–20.0)

## 2014-12-15 LAB — CK: Total CK: 295 U/L — ABNORMAL HIGH (ref 7–177)

## 2014-12-15 LAB — SEDIMENTATION RATE: Sed Rate: 50 mm/hr — ABNORMAL HIGH (ref 0–22)

## 2014-12-15 NOTE — Assessment & Plan Note (Addendum)
?  Myositis, panel ordered June 10 not resulted, will call lab to get results, cont referral to rheum, check repeat [email or web address]  Note:  Total time for pt hx, exam, review of record with pt in the room, determination of diagnoses and plan for further eval and tx is > 40 min, with over 50% spent in coordination and counseling of patient

## 2014-12-15 NOTE — Assessment & Plan Note (Signed)
stable overall by history and exam, recent data reviewed with pt, and pt to continue medical treatment as before,  to f/u any worsening symptoms or concerns BP Readings from Last 3 Encounters:  12/15/14 122/68  12/10/14 122/76  12/02/14 128/80

## 2014-12-15 NOTE — Assessment & Plan Note (Signed)
Also due for f/u colnoscopy

## 2014-12-15 NOTE — Patient Instructions (Addendum)
Your right ear was irrigated today  Please continue all other medications as before, including finishing the prednisone  Please call for repeat prednisone if your pain worsens after finishing  Please have the pharmacy call with any other refills you may need.  Please continue your efforts at being more active, low cholesterol diet, and weight control.  You are otherwise up to date with prevention measures today.  Please keep your appointments with your specialists as you may have planned - to see PCC's today regarding your planned Rheumatology referral  You will be contacted regarding the referral for: colonoscopy  Please go to the LAB in the Basement (turn left off the elevator) for the tests to be done today  You will be contacted by phone if any changes need to be made immediately.  Otherwise, you will receive a letter about your results with an explanation, but please check with MyChart first.  Please remember to sign up for MyChart if you have not done so, as this will be important to you in the future with finding out test results, communicating by private email, and scheduling acute appointments online when needed.  Please return in 6 months, or sooner if needed

## 2014-12-15 NOTE — Assessment & Plan Note (Signed)
stable overall by history and exam, recent data reviewed with pt, and pt to continue medical treatment as before,  to f/u any worsening symptoms or concerns Lab Results  Component Value Date   LDLCALC * 01/05/2010    105        Total Cholesterol/HDL:CHD Risk Coronary Heart Disease Risk Table                     Men   Women  1/2 Average Risk   3.4   3.3  Average Risk       5.0   4.4  2 X Average Risk   9.6   7.1  3 X Average Risk  23.4   11.0        Use the calculated Patient Ratio above and the CHD Risk Table to determine the patient's CHD Risk.        ATP III CLASSIFICATION (LDL):  <100     mg/dL   Optimal  100-129  mg/dL   Near or Above                    Optimal  130-159  mg/dL   Borderline  160-189  mg/dL   High  >190     mg/dL   Very High   For fu labs today

## 2014-12-15 NOTE — Progress Notes (Signed)
Pre visit review using our clinic review tool, if applicable. No additional management support is needed unless otherwise documented below in the visit note. 

## 2014-12-15 NOTE — Progress Notes (Signed)
   Subjective:    Patient ID: Briana Schroeder, female    DOB: 1945-10-22, 69 y.o.   MRN: [id number]  HPI     Also with right hearing loss x 2 wks - ? Wax again Review of Systems     Objective:   Physical Exam        Assessment & Plan:

## 2014-12-15 NOTE — Assessment & Plan Note (Signed)
Improved s/p irrigation,  to f/u any worsening symptoms or concerns  

## 2014-12-16 ENCOUNTER — Encounter: Payer: Self-pay | Admitting: Gastroenterology

## 2014-12-27 LAB — MYOSITIS PANEL III
EJ: NEGATIVE
Jo-1 (WB)*: NEGATIVE
Ku*: NEGATIVE
Mi-2 antibodies*: NEGATIVE
OJ*: NEGATIVE
PL-12*: NEGATIVE
PL-7*: NEGATIVE
PM-SCL 75: NEGATIVE
PM-Scl 100*: NEGATIVE
RNP: 18.2 EU/ml
RO-52*: NEGATIVE
SIGNAL RECOGNITION PARTICLE: NEGATIVE

## 2014-12-28 ENCOUNTER — Encounter: Payer: Self-pay | Admitting: Internal Medicine

## 2014-12-29 DIAGNOSIS — R799 Abnormal finding of blood chemistry, unspecified: Secondary | ICD-10-CM | POA: Diagnosis not present

## 2014-12-29 DIAGNOSIS — M25511 Pain in right shoulder: Secondary | ICD-10-CM | POA: Diagnosis not present

## 2014-12-29 DIAGNOSIS — M542 Cervicalgia: Secondary | ICD-10-CM | POA: Diagnosis not present

## 2014-12-29 DIAGNOSIS — R109 Unspecified abdominal pain: Secondary | ICD-10-CM | POA: Diagnosis not present

## 2014-12-29 DIAGNOSIS — M25572 Pain in left ankle and joints of left foot: Secondary | ICD-10-CM | POA: Diagnosis not present

## 2014-12-29 DIAGNOSIS — M25571 Pain in right ankle and joints of right foot: Secondary | ICD-10-CM | POA: Diagnosis not present

## 2014-12-29 DIAGNOSIS — R700 Elevated erythrocyte sedimentation rate: Secondary | ICD-10-CM | POA: Diagnosis not present

## 2014-12-29 DIAGNOSIS — Z79899 Other long term (current) drug therapy: Secondary | ICD-10-CM | POA: Diagnosis not present

## 2015-02-17 ENCOUNTER — Encounter: Payer: Medicare Other | Admitting: Gastroenterology

## 2015-02-24 DIAGNOSIS — R799 Abnormal finding of blood chemistry, unspecified: Secondary | ICD-10-CM | POA: Diagnosis not present

## 2015-02-24 DIAGNOSIS — R760 Raised antibody titer: Secondary | ICD-10-CM | POA: Diagnosis not present

## 2015-02-24 DIAGNOSIS — M25474 Effusion, right foot: Secondary | ICD-10-CM | POA: Diagnosis not present

## 2015-02-24 DIAGNOSIS — M25475 Effusion, left foot: Secondary | ICD-10-CM | POA: Diagnosis not present

## 2015-02-24 DIAGNOSIS — R700 Elevated erythrocyte sedimentation rate: Secondary | ICD-10-CM | POA: Diagnosis not present

## 2015-03-08 ENCOUNTER — Other Ambulatory Visit: Payer: Self-pay | Admitting: Internal Medicine

## 2015-04-14 ENCOUNTER — Ambulatory Visit (AMBULATORY_SURGERY_CENTER): Payer: Self-pay | Admitting: *Deleted

## 2015-04-14 VITALS — Ht 63.0 in | Wt 146.0 lb

## 2015-04-14 DIAGNOSIS — Z86010 Personal history of colonic polyps: Secondary | ICD-10-CM

## 2015-04-14 MED ORDER — NA SULFATE-K SULFATE-MG SULF 17.5-3.13-1.6 GM/177ML PO SOLN: 1.0000 | Freq: Once | ORAL | Status: DC

## 2015-04-14 NOTE — Progress Notes (Signed)
No egg or soy allergy No diet pills No home 02 No issues with past sedation  emmi declined     

## 2015-04-18 ENCOUNTER — Telehealth: Payer: Self-pay

## 2015-04-18 NOTE — Telephone Encounter (Signed)
No [id number] Patient called to educate on Medicare Wellness apt. LVM for the patient to call back to educate and schedule for wellness visit.

## 2015-04-20 NOTE — Telephone Encounter (Signed)
2nd attempt to outreach the patient for AWV / no scheduled CPE

## 2015-04-26 ENCOUNTER — Ambulatory Visit (AMBULATORY_SURGERY_CENTER): Payer: Medicare Other | Admitting: Gastroenterology

## 2015-04-26 ENCOUNTER — Encounter: Payer: Self-pay | Admitting: Gastroenterology

## 2015-04-26 VITALS — BP 110/81 | HR 81 | Temp 97.8°F | Resp 16 | Ht 63.0 in | Wt 146.0 lb

## 2015-04-26 DIAGNOSIS — Z1211 Encounter for screening for malignant neoplasm of colon: Secondary | ICD-10-CM | POA: Diagnosis not present

## 2015-04-26 DIAGNOSIS — D124 Benign neoplasm of descending colon: Secondary | ICD-10-CM | POA: Diagnosis not present

## 2015-04-26 DIAGNOSIS — D122 Benign neoplasm of ascending colon: Secondary | ICD-10-CM | POA: Diagnosis not present

## 2015-04-26 DIAGNOSIS — D860 Sarcoidosis of lung: Secondary | ICD-10-CM | POA: Diagnosis not present

## 2015-04-26 DIAGNOSIS — D123 Benign neoplasm of transverse colon: Secondary | ICD-10-CM

## 2015-04-26 DIAGNOSIS — I10 Essential (primary) hypertension: Secondary | ICD-10-CM | POA: Diagnosis not present

## 2015-04-26 DIAGNOSIS — Z86010 Personal history of colonic polyps: Secondary | ICD-10-CM | POA: Diagnosis not present

## 2015-04-26 MED ORDER — SODIUM CHLORIDE 0.9 % IV SOLN: 500.0000 mL | INTRAVENOUS | Status: DC

## 2015-04-26 NOTE — Op Note (Signed)
[place]  [address] [place], [id number]   COLONOSCOPY PROCEDURE REPORT  PATIENT: Briana, Schroeder  MR#: [id number] BIRTHDATE: 1946/04/09 , 81  yrs. old GENDER: female ENDOSCOPIST: Ladene Artist, MD, [facility] PROCEDURE DATE:  04/26/2015 PROCEDURE:   Colonoscopy, surveillance , Colonoscopy with biopsy, and Colonoscopy with snare polypectomy First Screening Colonoscopy - Avg.  risk and is 50 yrs.  old or older - No.  Prior Negative Screening - Now for repeat screening. N/A  History of Adenoma - Now for follow-up colonoscopy & has been > or = to 3 yrs.  Yes hx of adenoma.  Has been 3 or more years since last colonoscopy.  Polyps removed today? Yes ASA CLASS:   Class II INDICATIONS:Surveillance due to prior colonic neoplasia and PH Colon Adenoma. MEDICATIONS: Monitored anesthesia care and Propofol 200 mg IV DESCRIPTION OF PROCEDURE:   After the risks benefits and alternatives of the procedure were thoroughly explained, informed consent was obtained.  The digital rectal exam revealed no abnormalities of the rectum.   The LB [id number]  endoscope was introduced through the anus and advanced to the cecum, which was identified by both the appendix and ileocecal valve. No adverse events experienced.   The quality of the prep was excellent. (Suprep was used)  The instrument was then slowly withdrawn as the colon was fully examined. Estimated blood loss is zero unless otherwise noted in this procedure report.    COLON FINDINGS: Three sessile polyps measuring 6-7 mm in size were found in the descending colon and ascending colon.  Polypectomies were performed with a cold snare.  The resection was complete, the polyp tissue was completely retrieved and sent to histology.   Two sessile polyps measuring 4 mm in size were found in the transverse colon.  Polypectomies were performed with cold forceps.  The resection was complete, the polyp tissue was completely  retrieved and sent to histology.   There was mild diverticulosis noted in the sigmoid colon.   The examination was otherwise normal.  Retroflexed views revealed no abnormalities. The time to cecum = 1.7 Withdrawal time = 10.1   The scope was withdrawn and the procedure completed. COMPLICATIONS: There were no immediate complications.  ENDOSCOPIC IMPRESSION: 1.   Three sessile polyps in the descending colon and ascending colon; polypectomies performed with a cold snare 2.   Two sessile polyps in the transverse colon; polypectomies performed with cold forceps 3.   Mild diverticulosis in the sigmoid colon  RECOMMENDATIONS: 1.  Await pathology results 2.  High fiber diet with liberal fluid intake. 3.  Repeat colonoscopy in 3-5 years pending pathology review  eSigned:  Ladene Artist, MD, [facility] 04/26/2015 9:30 AM

## 2015-04-26 NOTE — Patient Instructions (Signed)
YOU HAD AN ENDOSCOPIC PROCEDURE TODAY AT THE [facility]:   Refer to the procedure report that was given to you for any specific questions about what was found during the examination.  If the procedure report does not answer your questions, please call your gastroenterologist to clarify.  If you requested that your care partner not be given the details of your procedure findings, then the procedure report has been included in a sealed envelope for you to review at your convenience later.  YOU SHOULD EXPECT: Some feelings of bloating in the abdomen. Passage of more gas than usual.  Walking can help get rid of the air that was put into your GI tract during the procedure and reduce the bloating. If you had a lower endoscopy (such as a colonoscopy or flexible sigmoidoscopy) you may notice spotting of blood in your stool or on the toilet paper. If you underwent a bowel prep for your procedure, you may not have a normal bowel movement for a few days.  Please Note:  You might notice some irritation and congestion in your nose or some drainage.  This is from the oxygen used during your procedure.  There is no need for concern and it should clear up in a day or so.  SYMPTOMS TO REPORT IMMEDIATELY:   Following lower endoscopy (colonoscopy or flexible sigmoidoscopy):  Excessive amounts of blood in the stool  Significant tenderness or worsening of abdominal pains  Swelling of the abdomen that is new, acute  Fever of 100F or higher   For urgent or emergent issues, a gastroenterologist can be reached at any hour by calling [phone].   DIET: Your first meal following the procedure should be a small meal and then it is ok to progress to your normal diet. Heavy or fried foods are harder to digest and may make you feel nauseous or bloated.  Likewise, meals heavy in dairy and vegetables can increase bloating.  Drink plenty of fluids but you should avoid alcoholic beverages for 24  hours.  ACTIVITY:  You should plan to take it easy for the rest of today and you should NOT DRIVE or use heavy machinery until tomorrow (because of the sedation medicines used during the test).    FOLLOW UP: Our staff will call the number listed on your records the next business day following your procedure to check on you and address any questions or concerns that you may have regarding the information given to you following your procedure. If we do not reach you, we will leave a message.  However, if you are feeling well and you are not experiencing any problems, there is no need to return our call.  We will assume that you have returned to your regular daily activities without incident.  If any biopsies were taken you will be contacted by phone or by letter within the next 1-3 weeks.  Please call us at [phone] if you have not heard about the biopsies in 3 weeks.    SIGNATURES/CONFIDENTIALITY: You and/or your care partner have signed paperwork which will be entered into your electronic medical record.  These signatures attest to the fact that that the information above on your After Visit Summary has been reviewed and is understood.  Full responsibility of the confidentiality of this discharge information lies with you and/or your care-partner. 

## 2015-04-26 NOTE — Progress Notes (Signed)
Patient awakening,vss,report to rn 

## 2015-04-26 NOTE — Progress Notes (Signed)
Called to room to assist during endoscopic procedure.  Patient ID and intended procedure confirmed with present staff. Received instructions for my participation in the procedure from the performing physician.  

## 2015-04-27 ENCOUNTER — Telehealth: Payer: Self-pay | Admitting: *Deleted

## 2015-04-27 NOTE — Telephone Encounter (Signed)
  Follow up Call-  Call back number 04/26/2015  Post procedure Call Back phone  # [phone]  Permission to leave phone message Yes     Patient questions:  Do you have a fever, pain , or abdominal swelling? No. Pain Score  0 *  Have you tolerated food without any problems? Yes.    Have you been able to return to your normal activities? Yes.    Do you have any questions about your discharge instructions: Diet   No. Medications  No. Follow up visit  no  Do you have questions or concerns about your Care? No.  Actions: * If pain score is 4 or above: No action needed, pain <4.

## 2015-05-05 ENCOUNTER — Encounter: Payer: Self-pay | Admitting: Gastroenterology

## 2015-05-16 ENCOUNTER — Other Ambulatory Visit: Payer: Self-pay | Admitting: Internal Medicine

## 2015-05-16 ENCOUNTER — Other Ambulatory Visit: Payer: Self-pay

## 2015-05-16 MED ORDER — HYDROCHLOROTHIAZIDE 25 MG PO TABS: 25.0000 mg | ORAL_TABLET | Freq: Every day | ORAL | Status: DC

## 2015-06-13 ENCOUNTER — Other Ambulatory Visit: Payer: Self-pay | Admitting: Internal Medicine

## 2015-06-20 ENCOUNTER — Encounter: Payer: Self-pay | Admitting: Gastroenterology

## 2015-08-25 DIAGNOSIS — H52221 Regular astigmatism, right eye: Secondary | ICD-10-CM | POA: Diagnosis not present

## 2015-08-25 DIAGNOSIS — H5203 Hypermetropia, bilateral: Secondary | ICD-10-CM | POA: Diagnosis not present

## 2015-08-25 DIAGNOSIS — H2513 Age-related nuclear cataract, bilateral: Secondary | ICD-10-CM | POA: Diagnosis not present

## 2015-08-25 DIAGNOSIS — H524 Presbyopia: Secondary | ICD-10-CM | POA: Diagnosis not present

## 2015-09-23 ENCOUNTER — Other Ambulatory Visit: Payer: Self-pay

## 2015-09-23 DIAGNOSIS — Z1231 Encounter for screening mammogram for malignant neoplasm of breast: Secondary | ICD-10-CM

## 2015-09-29 ENCOUNTER — Encounter: Payer: PRIVATE HEALTH INSURANCE | Admitting: Podiatry

## 2015-10-12 ENCOUNTER — Ambulatory Visit (INDEPENDENT_AMBULATORY_CARE_PROVIDER_SITE_OTHER): Payer: Medicare Other | Admitting: Podiatry

## 2015-10-12 ENCOUNTER — Ambulatory Visit (INDEPENDENT_AMBULATORY_CARE_PROVIDER_SITE_OTHER): Payer: Medicare Other

## 2015-10-12 ENCOUNTER — Encounter: Payer: Self-pay | Admitting: Podiatry

## 2015-10-12 DIAGNOSIS — L84 Corns and callosities: Secondary | ICD-10-CM | POA: Diagnosis not present

## 2015-10-12 DIAGNOSIS — M2040 Other hammer toe(s) (acquired), unspecified foot: Secondary | ICD-10-CM

## 2015-10-12 DIAGNOSIS — M779 Enthesopathy, unspecified: Secondary | ICD-10-CM | POA: Diagnosis not present

## 2015-10-12 MED ORDER — TRIAMCINOLONE ACETONIDE 10 MG/ML IJ SUSP
10.0000 mg | Freq: Once | INTRAMUSCULAR | Status: AC
  Administered 2015-10-12: 10 mg

## 2015-10-12 NOTE — Progress Notes (Signed)
   Subjective:    Patient ID: Briana Schroeder, female    DOB: 1945-10-21, 70 y.o.   MRN: [id number]  HPI  Chief Complaint  Patient presents with  . Toe Pain    2nd toes bilateral and 5th toe (interdigital) right - tender for several months, corns developing, shoes are rubbing toes, 5th toe extremely painful at times, tried trimming the thick skin, no help.       Review of Systems  Musculoskeletal: Positive for arthralgias.  All other systems reviewed and are negative.      Objective:   Physical Exam        Assessment & Plan:

## 2015-10-12 NOTE — Progress Notes (Signed)
Subjective:     Patient ID: Briana Schroeder, female   DOB: March 22, 1946, 70 y.o.   MRN: [id number]  HPI patient states that had a lot of pain in my right fifth toe for the last several months and the calluses thick and makes it hard for me to walk comfortably. I did have surgery on this and number of years ago   Review of Systems  All other systems reviewed and are negative.      Objective:   Physical Exam  Constitutional: She is oriented to person, place, and time.  Cardiovascular: Intact distal pulses.   Musculoskeletal: Normal range of motion.  Neurological: She is oriented to person, place, and time.  Skin: Skin is warm and dry.  Nursing note and vitals reviewed.  neurovascular status intact muscle strength adequate range of motion within normal limits with patient found to have former syndactylization procedure of the fourth and fifth digits right with some distal tissue that was not syndactyly lies. Significant keratotic lesion fifth digit right with fluid buildup around the head of the proximal phalanx with pain and mild elevation second toe right. Good digital perfusion and well oriented 3     Assessment:     Abnormal friction between the fourth and fifth toes right with inflammatory capsulitis outer side fifth digit right with hammertoe deformity second right    Plan:     H&P and x-rays reviewed. Proximal nerve block consisting of 60 mg I can Marcaine mixture administered and then I went ahead and injected underneath the capsule to milligrams Dexon some Kenalog 5 mill grams Xylocaine and debrided lesions and discussed further surgery if symptoms persist. Applied padding for second digit  Viewed x-rays indicating elevation the second digit with previous surgery performed on the fifth digit

## 2015-10-13 ENCOUNTER — Ambulatory Visit: Payer: Self-pay

## 2015-10-23 ENCOUNTER — Other Ambulatory Visit: Payer: Self-pay | Admitting: Internal Medicine

## 2015-11-23 ENCOUNTER — Other Ambulatory Visit: Payer: Self-pay | Admitting: Internal Medicine

## 2015-11-28 ENCOUNTER — Other Ambulatory Visit: Payer: Self-pay | Admitting: Internal Medicine

## 2015-11-30 NOTE — Progress Notes (Signed)
This encounter was created in error - please disregard.

## 2015-12-20 ENCOUNTER — Encounter: Payer: Self-pay | Admitting: Internal Medicine

## 2015-12-26 ENCOUNTER — Other Ambulatory Visit: Payer: Self-pay | Admitting: Internal Medicine

## 2015-12-29 ENCOUNTER — Ambulatory Visit: Payer: Self-pay

## 2015-12-29 ENCOUNTER — Other Ambulatory Visit: Payer: Self-pay | Admitting: Internal Medicine

## 2016-01-26 ENCOUNTER — Encounter: Payer: Self-pay | Admitting: Internal Medicine

## 2016-02-09 ENCOUNTER — Encounter: Payer: Self-pay | Admitting: Internal Medicine

## 2016-02-09 ENCOUNTER — Other Ambulatory Visit (INDEPENDENT_AMBULATORY_CARE_PROVIDER_SITE_OTHER): Payer: Medicare Other

## 2016-02-09 ENCOUNTER — Ambulatory Visit (INDEPENDENT_AMBULATORY_CARE_PROVIDER_SITE_OTHER): Payer: Medicare Other | Admitting: Internal Medicine

## 2016-02-09 VITALS — BP 120/72 | HR 75 | Temp 98.6°F | Resp 20 | Wt 153.0 lb

## 2016-02-09 DIAGNOSIS — R202 Paresthesia of skin: Secondary | ICD-10-CM | POA: Diagnosis not present

## 2016-02-09 DIAGNOSIS — I10 Essential (primary) hypertension: Secondary | ICD-10-CM

## 2016-02-09 DIAGNOSIS — E785 Hyperlipidemia, unspecified: Secondary | ICD-10-CM

## 2016-02-09 DIAGNOSIS — R748 Abnormal levels of other serum enzymes: Secondary | ICD-10-CM

## 2016-02-09 DIAGNOSIS — H9193 Unspecified hearing loss, bilateral: Secondary | ICD-10-CM | POA: Diagnosis not present

## 2016-02-09 LAB — CBC WITH DIFFERENTIAL/PLATELET
Basophils Absolute: 0 10*3/uL (ref 0.0–0.1)
Basophils Relative: 0.6 % (ref 0.0–3.0)
EOS PCT: 3.4 % (ref 0.0–5.0)
Eosinophils Absolute: 0.2 10*3/uL (ref 0.0–0.7)
HCT: 38.8 % (ref 36.0–46.0)
Hemoglobin: 13.1 g/dL (ref 12.0–15.0)
LYMPHS ABS: 2.2 10*3/uL (ref 0.7–4.0)
Lymphocytes Relative: 35.4 % (ref 12.0–46.0)
MCHC: 33.7 g/dL (ref 30.0–36.0)
MCV: 88.7 fl (ref 78.0–100.0)
MONOS PCT: 6.5 % (ref 3.0–12.0)
Monocytes Absolute: 0.4 10*3/uL (ref 0.1–1.0)
NEUTROS ABS: 3.4 10*3/uL (ref 1.4–7.7)
NEUTROS PCT: 54.1 % (ref 43.0–77.0)
PLATELETS: 269 10*3/uL (ref 150.0–400.0)
RBC: 4.37 Mil/uL (ref 3.87–5.11)
RDW: 14.9 % (ref 11.5–15.5)
WBC: 6.3 10*3/uL (ref 4.0–10.5)

## 2016-02-09 LAB — LIPID PANEL
Cholesterol: 201 mg/dL — ABNORMAL HIGH (ref 0–200)
HDL: 80.3 mg/dL (ref >39.00)
LDL Cholesterol: 110 mg/dL — ABNORMAL HIGH (ref 0–99)
NONHDL: 120.78
Total CHOL/HDL Ratio: 3
Triglycerides: 52 mg/dL (ref 0.0–149.0)
VLDL: 10.4 mg/dL (ref 0.0–40.0)

## 2016-02-09 LAB — BASIC METABOLIC PANEL
BUN: 18 mg/dL (ref 6–23)
CALCIUM: 9.7 mg/dL (ref 8.4–10.5)
CO2: 32 meq/L (ref 19–32)
Chloride: 103 mEq/L (ref 96–112)
Creatinine, Ser: 0.69 mg/dL (ref 0.40–1.20)
GFR: 107.75 mL/min (ref >60.00)
Glucose, Bld: 90 mg/dL (ref 70–99)
Potassium: 4.8 mEq/L (ref 3.5–5.1)
SODIUM: 137 meq/L (ref 135–145)

## 2016-02-09 LAB — URINALYSIS, ROUTINE W REFLEX MICROSCOPIC
Bilirubin Urine: NEGATIVE
Hgb urine dipstick: NEGATIVE
KETONES UR: NEGATIVE
Nitrite: NEGATIVE
RBC / HPF: NONE SEEN (ref 0–?)
SPECIFIC GRAVITY, URINE: 1.015 (ref 1.000–1.030)
Total Protein, Urine: NEGATIVE
Urine Glucose: NEGATIVE
Urobilinogen, UA: 0.2 (ref 0.0–1.0)
pH: 6.5 (ref 5.0–8.0)

## 2016-02-09 LAB — HEPATIC FUNCTION PANEL
ALK PHOS: 21 U/L — AB (ref 39–117)
ALT: 57 U/L — ABNORMAL HIGH (ref 0–35)
AST: 67 U/L — ABNORMAL HIGH (ref 0–37)
Albumin: 4 g/dL (ref 3.5–5.2)
BILIRUBIN DIRECT: 0.1 mg/dL (ref 0.0–0.3)
Total Bilirubin: 0.6 mg/dL (ref 0.2–1.2)
Total Protein: 7.3 g/dL (ref 6.0–8.3)

## 2016-02-09 LAB — CK: Total CK: 1644 U/L — ABNORMAL HIGH (ref 7–177)

## 2016-02-09 LAB — SEDIMENTATION RATE: Sed Rate: 11 mm/hr (ref 0–30)

## 2016-02-09 LAB — VITAMIN B12: Vitamin B-12: 286 pg/mL (ref 211–911)

## 2016-02-09 LAB — TSH: TSH: 0.65 u[IU]/mL (ref 0.35–4.50)

## 2016-02-09 NOTE — Patient Instructions (Signed)
Please continue all other medications as before, and refills have been done if requested.  Please have the pharmacy call with any other refills you may need.  Please continue your efforts at being more active, low cholesterol diet, and weight control.  You are otherwise up to date with prevention measures today.  Please keep your appointments with your specialists as you may have planned  You will be contacted regarding the referral for: ENT  Please go to the LAB in the Basement (turn left off the elevator) for the tests to be done today  You will be contacted by phone if any changes need to be made immediately.  Otherwise, you will receive a letter about your results with an explanation, but please check with MyChart first.  Please remember to sign up for MyChart if you have not done so, as this will be important to you in the future with finding out test results, communicating by private email, and scheduling acute appointments online when needed.  Please return in 1 year for your yearly visit, or sooner if needed 

## 2016-02-09 NOTE — Progress Notes (Signed)
Pre visit review using our clinic review tool, if applicable. No additional management support is needed unless otherwise documented below in the visit note. 

## 2016-02-09 NOTE — Progress Notes (Signed)
Subjective:    Patient ID: Briana Schroeder, female    DOB: [birth year], 70 y.o.   MRN: [id number]  HPI  Here for yearly f/u;  Overall doing ok;  Pt denies Chest pain, worsening SOB, DOE, wheezing, orthopnea, PND, worsening LE edema, palpitations, dizziness or syncope.  Pt denies neurological change such as new headache, facial or extremity weakness.  Does have tingling frequently to the distal extremities for several months.  Pt denies polydipsia, polyuria, or low sugar symptoms. Pt states overall good compliance with treatment and medications, good tolerability, and has been trying to follow appropriate diet.  Pt denies worsening depressive symptoms, suicidal ideation or panic. No fever, night sweats, wt loss, loss of appetite, or other constitutional symptoms.  Pt states good ability with ADL's, has low fall risk, home safety reviewed and adequate, no other significant changes in hearing or vision, and only occasionally active with exercise.  Has gained several lbs, plans to do better with diet and excercise Wt Readings from Last 3 Encounters:  02/09/16 153 lb (69.4 kg)  04/26/15 146 lb (66.2 kg)  04/14/15 146 lb (66.2 kg)  Does also have bilat hearing loss assoc with wax impactions.without pain or vertigo Past Medical History:  Diagnosis Date  . ALLERGIC RHINITIS 08/02/2009  . Allergy   . ANXIETY 10/18/2008  . ARM PAIN, RIGHT 04/04/2009  . BACK PAIN 08/02/2009  . CHEST PAIN 12/05/2009  . COLONIC POLYPS, HX OF 10/18/2008  . DIZZINESS 12/05/2009  . EPIGASTRIC PAIN 01/09/2010  . FATIGUE 09/06/2009  . Fibroid   . GERD 12/06/2009  . HOARSENESS 09/06/2009  . HYPERLIPIDEMIA 10/18/2008  . HYPERTENSION 10/18/2008  . IBS 10/18/2008  . MYALGIA 03/21/2009  . Osteopenia    -1.06 Jul 2009  . PULMONARY SARCOIDOSIS 10/18/2008  . SINUSITIS- ACUTE-NOS 03/21/2009  . VOCAL CORD NODULE 09/06/2009   Past Surgical History:  Procedure Laterality Date  . ABDOMINAL HYSTERECTOMY  08/1992   TAH-DR G FOR FIBROIDS AND  MENORRHAGIA  . COLONOSCOPY  11-24-2009   3 polyps, +TA and HPP  . hx of vocal cord nodules    . s/p right finger tendon surgury    . UPPER GASTROINTESTINAL ENDOSCOPY  01-08-2010   normal    reports that she has never smoked. She has never used smokeless tobacco. She reports that she does not drink alcohol or use drugs. family history includes Breast cancer in her mother; Diabetes in her mother; Hypertension in her mother and sister. Allergies  Allergen Reactions  . Sertraline Hcl Diarrhea   Current Outpatient Prescriptions on File Prior to Visit  Medication Sig Dispense Refill  . amLODipine (NORVASC) 10 MG tablet TAKE ONE TABLET BY MOUTH ONCE DAILY 30 tablet 0  . hydrochlorothiazide (HYDRODIURIL) 25 MG tablet TAKE ONE TABLET BY MOUTH DAILY 90 tablet 0  . Multiple Vitamin (MULTIVITAMIN) tablet Take 1 tablet by mouth daily.    . potassium chloride (K-DUR) 10 MEQ tablet TAKE ONE TABLET BY MOUTH ONCE DAILY 90 tablet 0  . VITAMIN D, CHOLECALCIFEROL, PO Take 5,000 Units by mouth daily.     No current facility-administered medications on file prior to visit.   Pt plans to call for mammogram. Review of Systems Constitutional: Negative for increased diaphoresis, or other activity, appetite or siginficant weight change other than noted HENT: Negative for worsening hearing loss, ear pain, facial swelling, mouth sores and neck stiffness.   Eyes: Negative for other worsening pain, redness or visual disturbance.  Respiratory: Negative for choking or stridor Cardiovascular:  Negative for other chest pain and palpitations.  Gastrointestinal: Negative for worsening diarrhea, blood in stool, or abdominal distention Genitourinary: Negative for hematuria, flank pain or change in urine volume.  Musculoskeletal: Negative for myalgias or other joint complaints.  Skin: Negative for other color change and wound or drainage.  Neurological: Negative for syncope and numbness. other than noted Hematological:  Negative for adenopathy. or other swelling Psychiatric/Behavioral: Negative for hallucinations, SI, self-injury, decreased concentration or other worsening agitation.      Objective:   Physical Exam BP 120/72   Pulse 75   Temp 98.6 F (37 C) (Oral)   Resp 20   Wt 153 lb (69.4 kg)   SpO2 96%   BMI 27.10 kg/m  VS noted,  Constitutional: Pt is oriented to person, place, and time. Appears well-developed and well-nourished, in no significant distress Head: Normocephalic and atraumatic  Eyes: Conjunctivae and EOM are normal. Pupils are equal, round, and reactive to light Right Ear: External ear normal.  Left Ear: External ear normal Nose: Nose normal.  Mouth/Throat: Oropharynx is clear and moist  Neck: Normal range of motion. Neck supple. No JVD present. No tracheal deviation present or significant neck LA or mass Cardiovascular: Normal rate, regular rhythm, normal heart sounds and intact distal pulses.   Pulmonary/Chest: Effort normal and breath sounds without rales or wheezing  Abdominal: Soft. Bowel sounds are normal. NT. No HSM  Musculoskeletal: Normal range of motion. Exhibits no edema Lymphadenopathy: Has no cervical adenopathy.  Neurological: Pt is alert and oriented to person, place, and time. Pt has normal reflexes. No cranial nerve deficit. Motor grossly intact, sens intact to LT Skin: Skin is warm and dry. No rash noted or new ulcers Psychiatric:  Has normal mood and affect. Behavior is normal.    Assessment & Plan:

## 2016-02-12 NOTE — Assessment & Plan Note (Signed)
Exam benign, for b12,  to f/u any worsening symptoms or concerns

## 2016-02-12 NOTE — Assessment & Plan Note (Signed)
stable overall by history and exam, recent data reviewed with pt, and pt to continue medical treatment as before,  to f/u any worsening symptoms or concerns BP Readings from Last 3 Encounters:  02/09/16 120/72  04/26/15 110/81  12/15/14 122/68

## 2016-02-12 NOTE — Assessment & Plan Note (Signed)
stable overall by history and exam, recent data reviewed with pt, and pt to continue medical treatment as before,  to f/u any worsening symptoms or concerns Lab Results  Component Value Date   LDLCALC 110 (H) 02/09/2016

## 2016-02-12 NOTE — Assessment & Plan Note (Addendum)
Not Improved s/p attmpted irrigation of wax impactions, will refer ENT  Note:  Total time for pt hx, exam, review of record with pt in the room, determination of diagnoses and plan for further eval and tx is > 40 min, with over 50% spent in coordination and counseling of patient

## 2016-02-12 NOTE — Assessment & Plan Note (Signed)
Has hx of elev ck, etiology unclear, for f/u lab

## 2016-03-08 DIAGNOSIS — H6123 Impacted cerumen, bilateral: Secondary | ICD-10-CM | POA: Insufficient documentation

## 2016-03-08 DIAGNOSIS — H93292 Other abnormal auditory perceptions, left ear: Secondary | ICD-10-CM | POA: Diagnosis not present

## 2016-03-12 ENCOUNTER — Other Ambulatory Visit: Payer: Self-pay | Admitting: Internal Medicine

## 2016-03-12 ENCOUNTER — Telehealth: Payer: Self-pay | Admitting: Internal Medicine

## 2016-03-12 NOTE — Telephone Encounter (Signed)
Patient states that she had a missed call a week or so ago.  Thought it was in regard to labs.  I told her Dr. Jenny Reichmann stated for her to continue same treatment.  Please follow up in regard on labs.

## 2016-05-14 ENCOUNTER — Other Ambulatory Visit: Payer: Self-pay | Admitting: Internal Medicine

## 2016-07-05 ENCOUNTER — Other Ambulatory Visit: Payer: Self-pay | Admitting: Internal Medicine

## 2016-07-23 ENCOUNTER — Other Ambulatory Visit: Payer: Self-pay | Admitting: Internal Medicine

## 2016-08-30 ENCOUNTER — Ambulatory Visit: Payer: Medicare Other

## 2016-09-03 NOTE — Progress Notes (Addendum)
Subjective:   Briana Schroeder is a 72 y.o. female who presents for an Initial Medicare Annual Wellness Visit.  Review of Systems    No ROS.  Medicare Wellness Visit.     Sleep patterns: has interrupted sleep, feels rested on waking, gets up 1 times nightly to void and sleeps 5-6 hours nightly.   Home Safety/Smoke Alarms: Feels safe in home. Smoke alarms in place.    Living environment; residence and Firearm Safety: 1-story house/ trailer, no firearms. Lives with husband Seat Belt Safety/Bike Helmet: Wears seat belt.   Counseling:   Eye Exam- goes yearly Dental- goes yearly  Female:   Pap- N/A      Mammo-  Last 01/26/13, BI-RADS CATEGORY 1: Referral placed today     Dexa scan-  Last 07/20/09  Referral placed today   CCS- Last 04/26/15, precancerous polyps, recall 5 years      Objective:    There were no vitals filed for this visit. There is no height or weight on file to calculate BMI.   Current Medications (verified) Outpatient Encounter Prescriptions as of 09/04/2016  Medication Sig  . amLODipine (NORVASC) 10 MG tablet TAKE ONE TABLET BY MOUTH ONCE DAILY  . hydrochlorothiazide (HYDRODIURIL) 25 MG tablet TAKE ONE TABLET BY MOUTH ONCE DAILY  . Multiple Vitamin (MULTIVITAMIN) tablet Take 1 tablet by mouth daily.  . potassium chloride (K-DUR) 10 MEQ tablet TAKE ONE TABLET BY MOUTH ONCE DAILY  . VITAMIN D, CHOLECALCIFEROL, PO Take 5,000 Units by mouth daily.   No facility-administered encounter medications on file as of 09/04/2016.     Allergies (verified) Sertraline hcl   History: Past Medical History:  Diagnosis Date  . ALLERGIC RHINITIS 08/02/2009  . Allergy   . ANXIETY 10/18/2008  . ARM PAIN, RIGHT 04/04/2009  . BACK PAIN 08/02/2009  . CHEST PAIN 12/05/2009  . COLONIC POLYPS, HX OF 10/18/2008  . DIZZINESS 12/05/2009  . EPIGASTRIC PAIN 01/09/2010  . FATIGUE 09/06/2009  . Fibroid   . GERD 12/06/2009  . HOARSENESS 09/06/2009  . HYPERLIPIDEMIA 10/18/2008  . HYPERTENSION  10/18/2008  . IBS 10/18/2008  . MYALGIA 03/21/2009  . Osteopenia    -1.06 Jul 2009  . PULMONARY SARCOIDOSIS 10/18/2008  . SINUSITIS- ACUTE-NOS 03/21/2009  . VOCAL CORD NODULE 09/06/2009   Past Surgical History:  Procedure Laterality Date  . ABDOMINAL HYSTERECTOMY  08/1992   TAH-DR G FOR FIBROIDS AND MENORRHAGIA  . COLONOSCOPY  11-24-2009   3 polyps, +TA and HPP  . hx of vocal cord nodules    . s/p right finger tendon surgury    . UPPER GASTROINTESTINAL ENDOSCOPY  01-08-2010   normal   Family History  Problem Relation Age of Onset  . Diabetes Mother   . Hypertension Mother   . Breast cancer Mother   . Hypertension Sister   . Colon cancer Neg Hx   . Colon polyps Neg Hx   . Rectal cancer Neg Hx   . Stomach cancer Neg Hx    Social History   Occupational History  . daycare owner and operator    Social History Main Topics  . Smoking status: Never Smoker  . Smokeless tobacco: Never Used  . Alcohol use No  . Drug use: No  . Sexual activity: Yes    Birth control/ protection: Surgical    Tobacco Counseling Counseling given: Not Answered   Activities of Daily Living No flowsheet data found.  Immunizations and Health Maintenance Immunization History  Administered Date(s) Administered  .  Pneumococcal Conjugate-13 05/21/2013  . Pneumococcal Polysaccharide-23 09/06/2009  . Td 10/18/2008  . Zoster 10/18/2008   Health Maintenance Due  Topic Date Due  . PNA vac Low Risk Adult (2 of 2 - PPSV23) 09/07/2014  . MAMMOGRAM  01/27/2015  . INFLUENZA VACCINE  01/31/2016    Patient Care Team: Biagio Borg, MD as PCP - General  Indicate any recent Medical Services you may have received from other than [facility] providers in the past year (date may be approximate).     Assessment:   This is a routine wellness examination for Briana Schroeder. Physical assessment deferred to PCP.   Hearing/Vision screen No exam data present  Dietary issues and exercise activities discussed:   Diet (meal  preparation, eat out, water intake, caffeinated beverages, dairy products, fruits and vegetables): in general, a "healthy" diet  , well balanced, low salt water 2 bottles per day. Limited caffeine intake  Discussed increasing water intake Goals    None     Depression Screen PHQ 2/9 Scores 02/09/2016 12/15/2014 11/19/2013 05/21/2013  PHQ - 2 Score 0 0 0 0    Fall Risk Fall Risk  02/09/2016 12/15/2014 11/19/2013 05/21/2013  Falls in the past year? No No No No    Cognitive Function:       Ad8 score reviewed for issues:  Issues making decisions: no  Less interest in hobbies / activities: no  Repeats questions, stories (family complaining): no  Trouble using ordinary gadgets (microwave, computer, phone): no  Forgets the month or year: no  Mismanaging finances: no  Remembering appts: no  Daily problems with thinking and/or memory: no Ad8 score is= 0     Screening Tests Health Maintenance  Topic Date Due  . PNA vac Low Risk Adult (2 of 2 - PPSV23) 09/07/2014  . MAMMOGRAM  01/27/2015  . INFLUENZA VACCINE  01/31/2016  . TETANUS/TDAP  10/19/2018  . COLONOSCOPY  04/25/2020  . DEXA SCAN  Completed  . Hepatitis C Screening  Completed      Plan:    Continue to eat heart healthy diet (full of fruits, vegetables, whole grains, lean protein, water--limit salt, fat, and sugar intake) and increase physical activity as tolerated.  Continue doing brain stimulating activities (puzzles, reading, adult coloring books, staying active) to keep memory sharp.   During the course of the visit, Briana Schroeder was educated and counseled about the following appropriate screening and preventive services:   Vaccines to include Pneumoccal, Influenza, Hepatitis B, Td, Zostavax, HCV  Cardiovascular disease screening  Colorectal cancer screening  Bone density screening  Diabetes screening  Glaucoma screening  Mammography/PAP  Nutrition counseling  Patient Instructions (the written plan)  were given to the patient.    Michiel Cowboy, RN   09/03/2016    Medical screening examination/treatment/procedure(s) were performed by non-physician practitioner and as supervising physician I was immediately available for consultation/collaboration. I agree with above. Cathlean Cower, MD

## 2016-09-03 NOTE — Progress Notes (Signed)
Pre visit review using our clinic review tool, if applicable. No additional management support is needed unless otherwise documented below in the visit note. 

## 2016-09-04 ENCOUNTER — Ambulatory Visit (INDEPENDENT_AMBULATORY_CARE_PROVIDER_SITE_OTHER): Payer: Medicare Other | Admitting: *Deleted

## 2016-09-04 VITALS — BP 118/68 | HR 78 | Resp 18 | Ht 63.0 in | Wt 145.0 lb

## 2016-09-04 DIAGNOSIS — Z1231 Encounter for screening mammogram for malignant neoplasm of breast: Secondary | ICD-10-CM

## 2016-09-04 DIAGNOSIS — Z0000 Encounter for general adult medical examination without abnormal findings: Secondary | ICD-10-CM

## 2016-09-04 DIAGNOSIS — E2839 Other primary ovarian failure: Secondary | ICD-10-CM | POA: Diagnosis not present

## 2016-09-04 DIAGNOSIS — Z1239 Encounter for other screening for malignant neoplasm of breast: Secondary | ICD-10-CM

## 2016-09-04 NOTE — Patient Instructions (Addendum)
Continue to eat heart healthy diet (full of fruits, vegetables, whole grains, lean protein, water--limit salt, fat, and sugar intake) and increase physical activity as tolerated.  Continue doing brain stimulating activities (puzzles, reading, adult coloring books, staying active) to keep memory sharp.   Briana Schroeder , Thank you for taking time to come for your Medicare Wellness Visit. I appreciate your ongoing commitment to your health goals. Please review the following plan we discussed and let me know if I can assist you in the future.   These are the goals we discussed: Goals    . Increase water intake          Will try to drink 3 bottles of water per day.       This is a list of the screening recommended for you and due dates:  Health Maintenance  Topic Date Due  . Pneumonia vaccines (2 of 2 - PPSV23) 09/07/2014  . Mammogram  01/27/2015  . Flu Shot  09/29/2016*  . Tetanus Vaccine  10/19/2018  . Colon Cancer Screening  04/25/2020  . DEXA scan (bone density measurement)  Completed  .  Hepatitis C: One time screening is recommended by Center for Disease Control  (CDC) for  adults born from [birth year] through 1966.   Completed  *Topic was postponed. The date shown is not the original due date.

## 2016-09-12 ENCOUNTER — Telehealth: Payer: Self-pay | Admitting: Internal Medicine

## 2016-09-12 NOTE — Telephone Encounter (Signed)
PLEASE NOTE: All timestamps contained within this report are represented as [place] Standard Time. CONFIDENTIALTY NOTICE: This fax transmission is intended only for the addressee. It contains information that is legally privileged, confidential or otherwise protected from use or disclosure. If you are not the intended recipient, you are strictly prohibited from reviewing, disclosing, copying using or disseminating any of this information or taking any action in reliance on or regarding this information. If you have received this fax in error, please notify us immediately by telephone so that we can arrange for its return to [name]. Phone: [phone], Toll-Free: [phone], Fax: [phone] Page: 1 of 1 Call Id: [id number] [facility] Patient Name: Briana Schroeder DOB: 03-07-1946 Initial Comment Caller States she fell yesterday and hit her head, feels ok but not sure if she should be seen or not Nurse Assessment Nurse: Zorita Pang, RN, Neoma Laming Date/Time (Eastern Time): 09/12/2016 9:03:49 AM Confirm and document reason for call. If symptomatic, describe symptoms. ---The caller stated that she slipped and fell on the ice and hit the back of her head. She felt fine at bedtime. She stated that her eyes felt like she was coming down with a head cold. Did not lose consciousness and no dizziness. She states that there is no raised area. Does the patient have any new or worsening symptoms? ---Yes Will a triage be completed? ---Yes Related visit to physician within the last 2 weeks? ---No Does the PT have any chronic conditions? (i.e. diabetes, asthma, etc.) ---Yes List chronic conditions. ---hypertension, high cholesterol and takes potassium supplement Is this a behavioral health or substance abuse call? ---No Guidelines Guideline Title Affirmed Question Affirmed Notes Head Injury Scalp swelling, bruise or pain (all  triage questions negative) Final Disposition User [facility] [place], RN, Neoma Laming Comments The caller stated that she fell yesterday on the ice and hit the back of her head. She denies loss of consciousness and states that there was no break in the skin and minimal swelling in the area. She had her husband look at it. She denies headache, seizures, blurred vision, etc. She was advised to call back if she has any of those things and she verbalizes understanding. Disagree/Comply: Comply

## 2016-09-24 ENCOUNTER — Other Ambulatory Visit: Payer: Self-pay | Admitting: Internal Medicine

## 2016-10-09 ENCOUNTER — Ambulatory Visit: Payer: Medicare Other | Admitting: Internal Medicine

## 2016-10-10 ENCOUNTER — Encounter: Payer: Self-pay | Admitting: Internal Medicine

## 2016-10-10 ENCOUNTER — Ambulatory Visit (INDEPENDENT_AMBULATORY_CARE_PROVIDER_SITE_OTHER): Payer: Medicare Other | Admitting: Internal Medicine

## 2016-10-10 VITALS — BP 126/86 | HR 71 | Temp 98.3°F | Ht 63.0 in | Wt 146.0 lb

## 2016-10-10 DIAGNOSIS — M5412 Radiculopathy, cervical region: Secondary | ICD-10-CM

## 2016-10-10 DIAGNOSIS — I10 Essential (primary) hypertension: Secondary | ICD-10-CM | POA: Diagnosis not present

## 2016-10-10 MED ORDER — TRAMADOL HCL 50 MG PO TABS: 50.0000 mg | ORAL_TABLET | Freq: Three times a day (TID) | ORAL | 0 refills | Status: DC | PRN

## 2016-10-10 MED ORDER — PREDNISONE 10 MG PO TABS: ORAL_TABLET | ORAL | 0 refills | Status: DC

## 2016-10-10 MED ORDER — GABAPENTIN 100 MG PO CAPS: 100.0000 mg | ORAL_CAPSULE | Freq: Three times a day (TID) | ORAL | 3 refills | Status: DC

## 2016-10-10 NOTE — Progress Notes (Signed)
Pre visit review using our clinic review tool, if applicable. No additional management support is needed unless otherwise documented below in the visit note. 

## 2016-10-10 NOTE — Progress Notes (Signed)
Subjective:    Patient ID: Briana Schroeder, female    DOB: Mar 12, 1946, 71 y.o.   MRN: [id number]  HPI  Here with c/o 1 wk left neck pain mild to mod constant with radiation to the left elbow occasionally, worse to turn head to the left with some popping and crackling, alleve not helping much, nothing else makes better or worse. Pt denies bowel or bladder change, fever, wt loss,  worsening extermity numbness/weakness, gait change or falls.   Pt denies fever, wt loss, night sweats, loss of appetite, or other constitutional symptoms  Pt denies chest pain, increased sob or doe, wheezing, orthopnea, PND, increased LE swelling, palpitations, dizziness or syncope.   Pt denies polydipsia, polyuria, Pt denies new neurological symptoms such as new headache, or facial or extremity weakness or numbness Past Medical History:  Diagnosis Date  . ALLERGIC RHINITIS 08/02/2009  . Allergy   . ANXIETY 10/18/2008  . ARM PAIN, RIGHT 04/04/2009  . BACK PAIN 08/02/2009  . CHEST PAIN 12/05/2009  . COLONIC POLYPS, HX OF 10/18/2008  . DIZZINESS 12/05/2009  . EPIGASTRIC PAIN 01/09/2010  . FATIGUE 09/06/2009  . Fibroid   . GERD 12/06/2009  . HOARSENESS 09/06/2009  . HYPERLIPIDEMIA 10/18/2008  . HYPERTENSION 10/18/2008  . IBS 10/18/2008  . MYALGIA 03/21/2009  . Osteopenia    -1.06 Jul 2009  . PULMONARY SARCOIDOSIS 10/18/2008  . SINUSITIS- ACUTE-NOS 03/21/2009  . VOCAL CORD NODULE 09/06/2009   Past Surgical History:  Procedure Laterality Date  . ABDOMINAL HYSTERECTOMY  08/1992   TAH-DR G FOR FIBROIDS AND MENORRHAGIA  . COLONOSCOPY  11-24-2009   3 polyps, +TA and HPP  . hx of vocal cord nodules    . s/p right finger tendon surgury    . UPPER GASTROINTESTINAL ENDOSCOPY  01-08-2010   normal    reports that she has never smoked. She has never used smokeless tobacco. She reports that she does not drink alcohol or use drugs. family history includes Breast cancer in her mother; Diabetes in her mother; Hypertension in her mother and  sister. Allergies  Allergen Reactions  . Sertraline Hcl Diarrhea   Current Outpatient Prescriptions on File Prior to Visit  Medication Sig Dispense Refill  . amLODipine (NORVASC) 10 MG tablet TAKE ONE TABLET BY MOUTH ONCE DAILY 90 tablet 2  . hydrochlorothiazide (HYDRODIURIL) 25 MG tablet TAKE ONE TABLET BY MOUTH ONCE DAILY 90 tablet 1  . Multiple Vitamin (MULTIVITAMIN) tablet Take 1 tablet by mouth daily.    . potassium chloride (K-DUR) 10 MEQ tablet TAKE ONE TABLET BY MOUTH ONCE DAILY 90 tablet 0  . VITAMIN D, CHOLECALCIFEROL, PO Take 5,000 Units by mouth daily.     No current facility-administered medications on file prior to visit.     Review of Systems All otherwise neg per pt    Objective:   Physical Exam BP 126/86   Pulse 71   Temp 98.3 F (36.8 C) (Oral)   Ht 5\' 3"  (1.6 m)   Wt 146 lb (66.2 kg)   SpO2 98%   BMI 25.86 kg/m  VS noted, not ill appearing Constitutional: Pt appears in NAD HENT: Head: NCAT.  Right Ear: External ear normal.  Left Ear: External ear normal.  Eyes: . Pupils are equal, round, and reactive to light. Conjunctivae and EOM are normal Nose: without d/c or deformity Neck: Neck supple. Gross normal ROM Cardiovascular: Normal rate and regular rhythm.   Pulmonary/Chest: Effort normal and breath sounds without rales or wheezing.  Spine nontender midline or cervical paravertebral Neurological: Pt is alert. At baseline orientation, motor 5/5 intact Skin: Skin is warm. No rashes, other new lesions, no LE edema Psychiatric: Pt behavior is normal without agitation  No other exam findings    Assessment & Plan:

## 2016-10-10 NOTE — Patient Instructions (Addendum)
Please take all new medication as prescribed - the pain medication (tramadol), prednisone, and gabapentin  OK to stop the gabapetin in 2-3 days if the pain is not better with this, or if the pain is gone  Please continue all other medications as before, and refills have been done if requested.  Please have the pharmacy call with any other refills you may need.  Please keep your appointments with your specialists as you may have planned

## 2016-10-13 ENCOUNTER — Encounter (HOSPITAL_COMMUNITY): Payer: Self-pay

## 2016-10-13 ENCOUNTER — Emergency Department (HOSPITAL_COMMUNITY)
Admission: EM | Admit: 2016-10-13 | Discharge: 2016-10-13 | Disposition: A | Discharge status: Home / Self Care | Payer: Medicare Other | Attending: Emergency Medicine | Admitting: Emergency Medicine

## 2016-10-13 DIAGNOSIS — Z79899 Other long term (current) drug therapy: Secondary | ICD-10-CM | POA: Diagnosis not present

## 2016-10-13 DIAGNOSIS — I10 Essential (primary) hypertension: Secondary | ICD-10-CM | POA: Insufficient documentation

## 2016-10-13 DIAGNOSIS — K921 Melena: Secondary | ICD-10-CM | POA: Diagnosis not present

## 2016-10-13 LAB — COMPREHENSIVE METABOLIC PANEL
ALK PHOS: 22 U/L — AB (ref 38–126)
ALT: 50 U/L (ref 14–54)
ANION GAP: 7 (ref 5–15)
AST: 49 U/L — ABNORMAL HIGH (ref 15–41)
Albumin: 4 g/dL (ref 3.5–5.0)
BUN: 24 mg/dL — ABNORMAL HIGH (ref 6–20)
CALCIUM: 9.7 mg/dL (ref 8.9–10.3)
CO2: 27 mmol/L (ref 22–32)
CREATININE: 0.76 mg/dL (ref 0.44–1.00)
Chloride: 102 mmol/L (ref 101–111)
Glucose, Bld: 111 mg/dL — ABNORMAL HIGH (ref 65–99)
Potassium: 4 mmol/L (ref 3.5–5.1)
SODIUM: 136 mmol/L (ref 135–145)
TOTAL PROTEIN: 7.9 g/dL (ref 6.5–8.1)
Total Bilirubin: 0.7 mg/dL (ref 0.3–1.2)

## 2016-10-13 LAB — URINALYSIS, ROUTINE W REFLEX MICROSCOPIC
Bacteria, UA: NONE SEEN
Bilirubin Urine: NEGATIVE
GLUCOSE, UA: NEGATIVE mg/dL
HGB URINE DIPSTICK: NEGATIVE
Ketones, ur: NEGATIVE mg/dL
NITRITE: NEGATIVE
PH: 5 (ref 5.0–8.0)
Protein, ur: NEGATIVE mg/dL
SPECIFIC GRAVITY, URINE: 1.019 (ref 1.005–1.030)

## 2016-10-13 LAB — CBC
HCT: 37.7 % (ref 36.0–46.0)
HEMOGLOBIN: 13 g/dL (ref 12.0–15.0)
MCH: 30.7 pg (ref 26.0–34.0)
MCHC: 34.5 g/dL (ref 30.0–36.0)
MCV: 89.1 fL (ref 78.0–100.0)
PLATELETS: 314 10*3/uL (ref 150–400)
RBC: 4.23 MIL/uL (ref 3.87–5.11)
RDW: 14.5 % (ref 11.5–15.5)
WBC: 5.5 10*3/uL (ref 4.0–10.5)

## 2016-10-13 LAB — POC OCCULT BLOOD, ED: FECAL OCCULT BLD: NEGATIVE

## 2016-10-13 LAB — LIPASE, BLOOD: Lipase: 37 U/L (ref 11–51)

## 2016-10-13 NOTE — Assessment & Plan Note (Signed)
Mild to mod, exam benign without neuro changes, for gabapentin , tramadol and predpac asd, for an  to f/u any worsening symptoms or concerns, consider MRI if not improved

## 2016-10-13 NOTE — ED Notes (Signed)
Pt attempted to provide urine specimen but was unable to at this time 

## 2016-10-13 NOTE — ED Triage Notes (Signed)
She c/o few diarrhea stools per day, the most recent of which are "dark". She states she has had diarrhea ever since being placed on prednisone by her doctor.

## 2016-10-13 NOTE — Discharge Instructions (Signed)
Your stool was negative for blood. This is likely due to the medication he took the other day. Her hemoglobin was normal. I would stop the steroids. May use Imodium over-the-counter for diarrhea. Follow-up with your primary care doctor. Return if you develop any worsening symptoms.

## 2016-10-13 NOTE — Assessment & Plan Note (Signed)
stable overall by history and exam, recent data reviewed with pt, and pt to continue medical treatment as before,  to f/u any worsening symptoms or concerns BP Readings from Last 3 Encounters:  10/13/16 118/71  10/10/16 126/86  09/04/16 118/68

## 2016-10-13 NOTE — ED Notes (Signed)
Pt states she has been prescribed steroids, started with diarrhea yesterday, dark in color.

## 2016-10-13 NOTE — ED Provider Notes (Signed)
[facility] DEPT Provider Note   CSN: [id number] Arrival date & time: 10/13/16  7829     History   Chief Complaint Chief Complaint  Patient presents with  . Melena    HPI Briana Schroeder is a 71 y.o. female.  HPI 71 year old African-American female past medical history significant for GERD, anxiety, hypertension presents to the ED today with complaints of melena. The patient states that she was started on prednisone approximately 4 days ago by PCP for neck and left arm pain likely due to radiculopathy. Patient states approximately 2 days after taking the steroids she developed diarrhea. Patient took Lostine for the diarrhea and noticed that her stools or black 2 days ago. They've continued to be plaques she called her primary doctor today who told her to come to the ED for evaluation. Patient denies any abdominal pain. Denies any history of significant NSAID use. Patient is unsure last colonoscopy but was normal. She denies any fever, chills, lightheadedness, dizziness, shortness breath, chest pain, abdominal pain, nausea, emesis, hematochezia, urinary symptoms. Past Medical History:  Diagnosis Date  . ALLERGIC RHINITIS 08/02/2009  . Allergy   . ANXIETY 10/18/2008  . ARM PAIN, RIGHT 04/04/2009  . BACK PAIN 08/02/2009  . CHEST PAIN 12/05/2009  . COLONIC POLYPS, HX OF 10/18/2008  . DIZZINESS 12/05/2009  . EPIGASTRIC PAIN 01/09/2010  . FATIGUE 09/06/2009  . Fibroid   . GERD 12/06/2009  . HOARSENESS 09/06/2009  . HYPERLIPIDEMIA 10/18/2008  . HYPERTENSION 10/18/2008  . IBS 10/18/2008  . MYALGIA 03/21/2009  . Osteopenia    -1.06 Jul 2009  . PULMONARY SARCOIDOSIS 10/18/2008  . SINUSITIS- ACUTE-NOS 03/21/2009  . VOCAL CORD NODULE 09/06/2009    Patient Active Problem List   Diagnosis Date Noted  . Radiculitis of left cervical region 10/10/2016  . Hearing loss in right ear 12/15/2014  . Elevated CK 12/02/2014  . Impaired glucose tolerance 11/19/2013  . Bilateral hearing loss 05/21/2013  .  Hypokalemia 11/17/2012  . Paresthesia 05/19/2012  . Abnormal LFTs (liver function tests) 05/19/2012  . Hearing loss, bilateral 05/19/2012  . Low back pain 07/13/2011  . Preventative health care 05/15/2011  . Left shoulder pain 05/15/2011  . Osteopenia   . Fibroid   . Hearing loss sensory, bilateral 02/27/2011  . EPIGASTRIC PAIN 01/09/2010  . GERD 12/06/2009  . DIZZINESS 12/05/2009  . Hyperlipidemia 09/06/2009  . FATIGUE 09/06/2009  . HOARSENESS 09/06/2009  . ALLERGIC RHINITIS 08/02/2009  . PULMONARY SARCOIDOSIS 10/18/2008  . ANXIETY 10/18/2008  . Essential hypertension 10/18/2008  . IBS 10/18/2008  . History of colonic polyps 10/18/2008    Past Surgical History:  Procedure Laterality Date  . ABDOMINAL HYSTERECTOMY  08/1992   TAH-DR G FOR FIBROIDS AND MENORRHAGIA  . COLONOSCOPY  11-24-2009   3 polyps, +TA and HPP  . hx of vocal cord nodules    . s/p right finger tendon surgury    . UPPER GASTROINTESTINAL ENDOSCOPY  01-08-2010   normal    OB History    Gravida Para Term Preterm AB Living   2 2       2    SAB TAB Ectopic Multiple Live Births                   Home Medications    Prior to Admission medications   Medication Sig Start Date End Date Taking? Authorizing Provider  amLODipine (NORVASC) 10 MG tablet TAKE ONE TABLET BY MOUTH ONCE DAILY 05/14/16   Biagio Borg, MD  gabapentin (  NEURONTIN) 100 MG capsule Take 1 capsule (100 mg total) by mouth 3 (three) times daily. 10/10/16   Biagio Borg, MD  hydrochlorothiazide (HYDRODIURIL) 25 MG tablet TAKE ONE TABLET BY MOUTH ONCE DAILY 09/24/16   Biagio Borg, MD  Multiple Vitamin (MULTIVITAMIN) tablet Take 1 tablet by mouth daily.    Historical Provider, MD  potassium chloride (K-DUR) 10 MEQ tablet TAKE ONE TABLET BY MOUTH ONCE DAILY 07/24/16   Biagio Borg, MD  predniSONE (DELTASONE) 10 MG tablet 2 tabs by mouth per day for 5 days 10/10/16   Biagio Borg, MD  traMADol (ULTRAM) 50 MG tablet Take 1 tablet (50 mg total) by mouth  every 8 (eight) hours as needed. 10/10/16   Biagio Borg, MD  VITAMIN D, CHOLECALCIFEROL, PO Take 5,000 Units by mouth daily.    Historical Provider, MD    Family History Family History  Problem Relation Age of Onset  . Diabetes Mother   . Hypertension Mother   . Breast cancer Mother   . Hypertension Sister   . Colon cancer Neg Hx   . Colon polyps Neg Hx   . Rectal cancer Neg Hx   . Stomach cancer Neg Hx     Social History Social History  Substance Use Topics  . Smoking status: Never Smoker  . Smokeless tobacco: Never Used  . Alcohol use No     Allergies   Sertraline hcl   Review of Systems Review of Systems  Constitutional: Negative for chills and fever.  HENT: Negative for congestion.   Gastrointestinal: Positive for blood in stool and diarrhea. Negative for abdominal pain, nausea and vomiting.  Genitourinary: Negative for dysuria, flank pain, frequency, hematuria and urgency.  Neurological: Negative for dizziness, weakness, light-headedness and headaches.     Physical Exam Updated Vital Signs BP 118/71 (BP Location: Right Arm)   Pulse 60   Temp 98.5 F (36.9 C) (Oral)   Resp 17   Ht 5\' 3"  (1.6 m)   Wt 66.2 kg   SpO2 100%   BMI 25.86 kg/m   Physical Exam  Constitutional: She appears well-developed and well-nourished. No distress.  HENT:  Head: Normocephalic and atraumatic.  Mouth/Throat: Oropharynx is clear and moist.  Eyes: Conjunctivae are normal. Right eye exhibits no discharge. Left eye exhibits no discharge. No scleral icterus.  Neck: Normal range of motion. Neck supple. No thyromegaly present.  Cardiovascular: Normal rate, regular rhythm, normal heart sounds and intact distal pulses.  Exam reveals no gallop and no friction rub.   No murmur heard. Pulmonary/Chest: Effort normal and breath sounds normal.  Abdominal: Soft. Bowel sounds are normal. She exhibits no distension. There is no tenderness. There is no rigidity, no rebound, no guarding and no  CVA tenderness.  Genitourinary:  Genitourinary Comments: Chaperone present for exam. Patient tolerated without any difficulties. black dark stool noted in the rectal vault. Normal rectal tone. No hematochezia.  Musculoskeletal: Normal range of motion.  Lymphadenopathy:    She has no cervical adenopathy.  Neurological: She is alert.  Skin: Skin is warm and dry. Capillary refill takes less than 2 seconds.  Nursing note and vitals reviewed.    ED Treatments / Results  Labs (all labs ordered are listed, but only abnormal results are displayed) Labs Reviewed  COMPREHENSIVE METABOLIC PANEL - Abnormal; Notable for the following:       Result Value   Glucose, Bld 111 (*)    BUN 24 (*)    AST 49 (*)  Alkaline Phosphatase 22 (*)    All other components within normal limits  URINALYSIS, ROUTINE W REFLEX MICROSCOPIC - Abnormal; Notable for the following:    APPearance HAZY (*)    Leukocytes, UA TRACE (*)    Squamous Epithelial / LPF 6-30 (*)    Non Squamous Epithelial 0-5 (*)    All other components within normal limits  LIPASE, BLOOD  CBC  OCCULT BLOOD X 1 CARD TO LAB, STOOL  POC OCCULT BLOOD, ED    EKG  EKG Interpretation None       Radiology No results found.  Procedures Procedures (including critical care time)  Medications Ordered in ED Medications - No data to display   Initial Impression / Assessment and Plan / ED Course  I have reviewed the triage vital signs and the nursing notes.  Pertinent labs & imaging results that were available during my care of the patient were reviewed by me and considered in my medical decision making (see chart for details).     Patient presents to the ED with complaints of diarrhea after starting steroids 4 days ago. She also notes that the diarrhea was black in color. She denies any associated symptoms including abdominal pain, nausea, emesis, chronic NSAID use, urinary symptoms. Hemoglobin is normal at 13.0. No leukocytosis is  noted. Electrolytes are normal. Lipase is normal. Ua shows no signs of infection. Rectal exam with black stool noted. Hemmocult was negative. Patient did take Kaopectate for diarrhea 2 days ago. Feel that this is the cause of patient's dark stools. Abdominal exam is benign without any focal tenderness. Vital signs are stable. Encourage patient to stop the steroids and this may be causing the diarrhea however do not believe she is having any melena or GI bleed. Encouraged to use over-the-counter Imodium for diarrhea. Have given strict return precautions. Encouraged to follow with her PCP. Patient verbalized understanding the plan of care and felt stable for discharge. All questions answered prior to discharge. Patient was seen and evaluated with Dr. Ralene Bathe who is agreeable to above plan.  Final Clinical Impressions(s) / ED Diagnoses   Final diagnoses:  Melena    New Prescriptions Discharge Medication List as of 10/13/2016  1:21 PM       Doristine Devoid, PA-C 10/13/16 [place], MD 10/14/16 [phone]

## 2016-10-13 NOTE — ED Notes (Signed)
Bed: WA21 Expected date:  Expected time:  Means of arrival:  Comments: 

## 2016-10-16 ENCOUNTER — Other Ambulatory Visit: Payer: Medicare Other

## 2016-10-16 ENCOUNTER — Ambulatory Visit: Payer: Medicare Other

## 2016-10-28 ENCOUNTER — Other Ambulatory Visit: Payer: Self-pay | Admitting: Internal Medicine

## 2016-11-01 ENCOUNTER — Other Ambulatory Visit: Payer: Self-pay | Admitting: Internal Medicine

## 2016-11-01 DIAGNOSIS — Z1231 Encounter for screening mammogram for malignant neoplasm of breast: Secondary | ICD-10-CM

## 2016-11-27 ENCOUNTER — Ambulatory Visit
Admission: RE | Admit: 2016-11-27 | Discharge: 2016-11-27 | Disposition: A | Discharge status: Home / Self Care | Payer: Medicare Other | Source: Ambulatory Visit | Attending: Internal Medicine | Admitting: Internal Medicine

## 2016-11-27 DIAGNOSIS — Z1231 Encounter for screening mammogram for malignant neoplasm of breast: Secondary | ICD-10-CM | POA: Diagnosis not present

## 2016-11-27 DIAGNOSIS — M8589 Other specified disorders of bone density and structure, multiple sites: Secondary | ICD-10-CM | POA: Diagnosis not present

## 2016-11-27 DIAGNOSIS — Z780 Asymptomatic menopausal state: Secondary | ICD-10-CM | POA: Diagnosis not present

## 2016-11-27 DIAGNOSIS — E2839 Other primary ovarian failure: Secondary | ICD-10-CM

## 2016-11-30 ENCOUNTER — Encounter: Payer: Self-pay | Admitting: Internal Medicine

## 2017-01-15 ENCOUNTER — Telehealth: Payer: Self-pay | Admitting: Internal Medicine

## 2017-01-15 NOTE — Telephone Encounter (Signed)
Pt called stating her hydrochlorothiazide (HYDRODIURIL) 25 MG tablet  Was recalled and she would like a call back regarding this

## 2017-01-15 NOTE — Telephone Encounter (Signed)
Called pt, LVM informing her that msg was received and Dr. Jenny Reichmann is aware of the recall. I stated that I would be in touch with her after I find out how to proceed from [place].

## 2017-01-21 NOTE — Telephone Encounter (Signed)
Pt called back to see what Dr Jenny Reichmann thinks she should be taking. She said that she never received your message.

## 2017-01-22 NOTE — Telephone Encounter (Signed)
I think she is getting mixed up. The recall was for valsartan and valsartan HCT, but not the HCT by itself.  Ok to cont same tx

## 2017-01-22 NOTE — Telephone Encounter (Signed)
Called pt, LVM with below info.  

## 2017-01-22 NOTE — Telephone Encounter (Signed)
Patient has been informed.

## 2017-02-25 ENCOUNTER — Other Ambulatory Visit: Payer: Self-pay | Admitting: Internal Medicine

## 2017-04-02 ENCOUNTER — Other Ambulatory Visit: Payer: Self-pay | Admitting: Internal Medicine

## 2017-04-05 ENCOUNTER — Telehealth: Payer: Self-pay | Admitting: Emergency Medicine

## 2017-04-05 MED ORDER — AMLODIPINE BESYLATE 10 MG PO TABS: 10.0000 mg | ORAL_TABLET | Freq: Every day | ORAL | 0 refills | Status: DC

## 2017-04-05 NOTE — Telephone Encounter (Signed)
30d supply sent to pharmacy.

## 2017-04-05 NOTE — Telephone Encounter (Signed)
Pt called in and needs a refill on her amlodipine. Notes stated pt needs to be seen for Annual before more refill can be given. Pt coming in Nov 14th. Offered 2 sooner appt but patient refused those. Let patient know 30 day supply will be called in until able to get to that appt. Thanks.

## 2017-04-15 ENCOUNTER — Other Ambulatory Visit: Payer: Self-pay | Admitting: Internal Medicine

## 2017-05-15 ENCOUNTER — Other Ambulatory Visit (INDEPENDENT_AMBULATORY_CARE_PROVIDER_SITE_OTHER): Payer: Medicare Other

## 2017-05-15 ENCOUNTER — Encounter: Payer: Self-pay | Admitting: Internal Medicine

## 2017-05-15 ENCOUNTER — Ambulatory Visit (INDEPENDENT_AMBULATORY_CARE_PROVIDER_SITE_OTHER)
Admission: RE | Admit: 2017-05-15 | Discharge: 2017-05-15 | Disposition: A | Discharge status: Home / Self Care | Payer: Medicare Other | Source: Ambulatory Visit | Attending: Internal Medicine | Admitting: Internal Medicine

## 2017-05-15 ENCOUNTER — Telehealth: Payer: Self-pay | Admitting: Internal Medicine

## 2017-05-15 ENCOUNTER — Ambulatory Visit (INDEPENDENT_AMBULATORY_CARE_PROVIDER_SITE_OTHER): Payer: Medicare Other | Admitting: Internal Medicine

## 2017-05-15 ENCOUNTER — Other Ambulatory Visit: Payer: Self-pay | Admitting: Internal Medicine

## 2017-05-15 VITALS — BP 126/86 | HR 81 | Temp 98.0°F | Ht 63.0 in | Wt 146.0 lb

## 2017-05-15 DIAGNOSIS — Z0000 Encounter for general adult medical examination without abnormal findings: Secondary | ICD-10-CM

## 2017-05-15 DIAGNOSIS — R748 Abnormal levels of other serum enzymes: Secondary | ICD-10-CM | POA: Diagnosis not present

## 2017-05-15 DIAGNOSIS — M791 Myalgia, unspecified site: Secondary | ICD-10-CM

## 2017-05-15 DIAGNOSIS — R079 Chest pain, unspecified: Secondary | ICD-10-CM

## 2017-05-15 LAB — CBC WITH DIFFERENTIAL/PLATELET
BASOS ABS: 0 10*3/uL (ref 0.0–0.1)
BASOS PCT: 0.9 % (ref 0.0–3.0)
EOS PCT: 3.5 % (ref 0.0–5.0)
Eosinophils Absolute: 0.2 10*3/uL (ref 0.0–0.7)
HEMATOCRIT: 40.6 % (ref 36.0–46.0)
Hemoglobin: 13.4 g/dL (ref 12.0–15.0)
LYMPHS PCT: 34.7 % (ref 12.0–46.0)
Lymphs Abs: 1.9 10*3/uL (ref 0.7–4.0)
MCHC: 33 g/dL (ref 30.0–36.0)
MCV: 91.4 fl (ref 78.0–100.0)
MONOS PCT: 8.5 % (ref 3.0–12.0)
Monocytes Absolute: 0.5 10*3/uL (ref 0.1–1.0)
NEUTROS ABS: 2.8 10*3/uL (ref 1.4–7.7)
Neutrophils Relative %: 52.4 % (ref 43.0–77.0)
PLATELETS: 273 10*3/uL (ref 150.0–400.0)
RBC: 4.45 Mil/uL (ref 3.87–5.11)
RDW: 14.6 % (ref 11.5–15.5)
WBC: 5.4 10*3/uL (ref 4.0–10.5)

## 2017-05-15 LAB — HEPATIC FUNCTION PANEL
ALBUMIN: 4.2 g/dL (ref 3.5–5.2)
ALT: 44 U/L — ABNORMAL HIGH (ref 0–35)
AST: 52 U/L — ABNORMAL HIGH (ref 0–37)
Alkaline Phosphatase: 21 U/L — ABNORMAL LOW (ref 39–117)
Bilirubin, Direct: 0.1 mg/dL (ref 0.0–0.3)
TOTAL PROTEIN: 7.6 g/dL (ref 6.0–8.3)
Total Bilirubin: 0.7 mg/dL (ref 0.2–1.2)

## 2017-05-15 LAB — BASIC METABOLIC PANEL
BUN: 18 mg/dL (ref 6–23)
CALCIUM: 10.3 mg/dL (ref 8.4–10.5)
CHLORIDE: 99 meq/L (ref 96–112)
CO2: 31 mEq/L (ref 19–32)
Creatinine, Ser: 0.77 mg/dL (ref 0.40–1.20)
GFR: 94.6 mL/min (ref >60.00)
Glucose, Bld: 107 mg/dL — ABNORMAL HIGH (ref 70–99)
Potassium: 4.3 mEq/L (ref 3.5–5.1)
Sodium: 137 mEq/L (ref 135–145)

## 2017-05-15 LAB — URINALYSIS, ROUTINE W REFLEX MICROSCOPIC
BILIRUBIN URINE: NEGATIVE
Hgb urine dipstick: NEGATIVE
Ketones, ur: NEGATIVE
Nitrite: NEGATIVE
PH: 6 (ref 5.0–8.0)
RBC / HPF: NONE SEEN (ref 0–?)
SPECIFIC GRAVITY, URINE: 1.02 (ref 1.000–1.030)
TOTAL PROTEIN, URINE-UPE24: NEGATIVE
Urine Glucose: NEGATIVE
Urobilinogen, UA: 0.2 (ref 0.0–1.0)

## 2017-05-15 LAB — TSH: TSH: 1.08 u[IU]/mL (ref 0.35–4.50)

## 2017-05-15 LAB — LIPID PANEL
CHOLESTEROL: 236 mg/dL — AB (ref 0–200)
HDL: 88.2 mg/dL (ref >39.00)
LDL Cholesterol: 136 mg/dL — ABNORMAL HIGH (ref 0–99)
NonHDL: 147.37
TRIGLYCERIDES: 57 mg/dL (ref 0.0–149.0)
Total CHOL/HDL Ratio: 3
VLDL: 11.4 mg/dL (ref 0.0–40.0)

## 2017-05-15 LAB — CK: CK TOTAL: 1275 U/L — AB (ref 7–177)

## 2017-05-15 MED ORDER — HYDROCHLOROTHIAZIDE 25 MG PO TABS: 25.0000 mg | ORAL_TABLET | Freq: Every day | ORAL | 3 refills | Status: DC

## 2017-05-15 MED ORDER — POTASSIUM CHLORIDE ER 10 MEQ PO TBCR: 10.0000 meq | EXTENDED_RELEASE_TABLET | Freq: Every day | ORAL | 3 refills | Status: DC

## 2017-05-15 MED ORDER — AMLODIPINE BESYLATE 10 MG PO TABS: 10.0000 mg | ORAL_TABLET | Freq: Every day | ORAL | 3 refills | Status: DC

## 2017-05-15 NOTE — Patient Instructions (Addendum)
Please continue all other medications as before, and refills have been done if requested.  Please have the pharmacy call with any other refills you may need.  Please continue your efforts at being more active, low cholesterol diet, and weight control.  You are otherwise up to date with prevention measures today.  Please keep your appointments with your specialists as you may have planned  Please go to the XRAY Department in the Basement (go straight as you get off the elevator) for the x-ray testing  Please go to the LAB in the Basement (turn left off the elevator) for the tests to be done today  You will be contacted by phone if any changes need to be made immediately.  Otherwise, you will receive a letter about your results with an explanation, but please check with MyChart first.  Please remember to sign up for MyChart if you have not done so, as this will be important to you in the future with finding out test results, communicating by private email, and scheduling acute appointments online when needed.  Please return in 1 year for your yearly visit, or sooner if needed, with Lab testing done 3-5 days before

## 2017-05-15 NOTE — Progress Notes (Signed)
Subjective:    Patient ID: Briana Schroeder, female    DOB: Jan 29, 1946, 71 y.o.   MRN: [id number]  HPI  Here for wellness and f/u;  Overall doing ok;  Pt denies worsening SOB, DOE, wheezing, orthopnea, PND, worsening LE edema, palpitations, dizziness or syncope, but has had elevated CPK and today with left lateral post chest/back pain.  Pt denies neurological change such as new headache, facial or extremity weakness.  Pt denies polydipsia, polyuria, or low sugar symptoms. Pt states overall good compliance with treatment and medications, good tolerability, and has been trying to follow appropriate diet.  Pt denies worsening depressive symptoms, suicidal ideation or panic. No fever, night sweats, wt loss, loss of appetite, or other constitutional symptoms.  Pt states good ability with ADL's, has low fall risk, home safety reviewed and adequate, no other significant changes in hearing or vision, and not active with exercise. Past Medical History:  Diagnosis Date  . ALLERGIC RHINITIS 08/02/2009  . Allergy   . ANXIETY 10/18/2008  . ARM PAIN, RIGHT 04/04/2009  . BACK PAIN 08/02/2009  . CHEST PAIN 12/05/2009  . COLONIC POLYPS, HX OF 10/18/2008  . DIZZINESS 12/05/2009  . EPIGASTRIC PAIN 01/09/2010  . FATIGUE 09/06/2009  . Fibroid   . GERD 12/06/2009  . HOARSENESS 09/06/2009  . HYPERLIPIDEMIA 10/18/2008  . HYPERTENSION 10/18/2008  . IBS 10/18/2008  . MYALGIA 03/21/2009  . Osteopenia    -1.06 Jul 2009  . PULMONARY SARCOIDOSIS 10/18/2008  . SINUSITIS- ACUTE-NOS 03/21/2009  . VOCAL CORD NODULE 09/06/2009   Past Surgical History:  Procedure Laterality Date  . ABDOMINAL HYSTERECTOMY  08/1992   TAH-DR G FOR FIBROIDS AND MENORRHAGIA  . COLONOSCOPY  11-24-2009   3 polyps, +TA and HPP  . hx of vocal cord nodules    . s/p right finger tendon surgury    . UPPER GASTROINTESTINAL ENDOSCOPY  01-08-2010   normal    reports that  has never smoked. she has never used smokeless tobacco. She reports that she does not drink  alcohol or use drugs. family history includes Breast cancer in her mother; Diabetes in her mother; Hypertension in her mother and sister. Allergies  Allergen Reactions  . Sertraline Hcl Diarrhea   Current Outpatient Medications on File Prior to Visit  Medication Sig Dispense Refill  . gabapentin (NEURONTIN) 100 MG capsule Take 1 capsule (100 mg total) by mouth 3 (three) times daily. 90 capsule 3  . Multiple Vitamin (MULTIVITAMIN) tablet Take 1 tablet by mouth daily.    Marland Kitchen VITAMIN D, CHOLECALCIFEROL, PO Take 5,000 Units by mouth daily.     No current facility-administered medications on file prior to visit.    Declines flu shot.  Review of Systems Constitutional: Negative for other unusual diaphoresis, sweats, appetite or weight changes HENT: Negative for other worsening hearing loss, ear pain, facial swelling, mouth sores or neck stiffness.   Eyes: Negative for other worsening pain, redness or other visual disturbance.  Respiratory: Negative for other stridor or swelling Cardiovascular: Negative for other palpitations or other chest pain  Gastrointestinal: Negative for worsening diarrhea or loose stools, blood in stool, distention or other pain Genitourinary: Negative for hematuria, flank pain or other change in urine volume.  Musculoskeletal: Negative for myalgias or other joint swelling.  Skin: Negative for other color change, or other wound or worsening drainage.  Neurological: Negative for other syncope or numbness. Hematological: Negative for other adenopathy or swelling Psychiatric/Behavioral: Negative for hallucinations, other worsening agitation, SI, self-injury, or new  decreased concentration\All other system neg per pt    Objective:   Physical Exam BP 126/86   Pulse 81   Temp 98 F (36.7 C) (Oral)   Ht 5\' 3"  (1.6 m)   Wt 146 lb (66.2 kg)   SpO2 98%   BMI 25.86 kg/m  VS noted,  Constitutional: Pt is oriented to person, place, and time. Appears well-developed and  well-nourished, in no significant distress and comfortable Head: Normocephalic and atraumatic  Eyes: Conjunctivae and EOM are normal. Pupils are equal, round, and reactive to light Right Ear: External ear normal without discharge Left Ear: External ear normal without discharge Nose: Nose without discharge or deformity Mouth/Throat: Oropharynx is without other ulcerations and moist  Neck: Normal range of motion. Neck supple. No JVD present. No tracheal deviation present or significant neck LA or mass Cardiovascular: Normal rate, regular rhythm, normal heart sounds and intact distal pulses.   Pulmonary/Chest: WOB normal and breath sounds without rales or wheezing  Abdominal: Soft. Bowel sounds are normal. NT. No HSM  Musculoskeletal: Normal range of motion. Exhibits no edema Lymphadenopathy: Has no other cervical adenopathy.  Neurological: Pt is alert and oriented to person, place, and time. Pt has normal reflexes. No cranial nerve deficit. Motor grossly intact, Gait intact Skin: Skin is warm and dry. No rash noted or new ulcerations Psychiatric:  Has normal mood and affect. Behavior is normal without agitation No other exam findings    Assessment & Plan:

## 2017-05-15 NOTE — Telephone Encounter (Signed)
Patient has been informed of lab results. She understood.

## 2017-05-18 NOTE — Assessment & Plan Note (Signed)
For f/u lab 

## 2017-05-18 NOTE — Assessment & Plan Note (Signed)

## 2017-05-18 NOTE — Assessment & Plan Note (Signed)
Likely msk, but for cxr, r/o underlying pulm issue

## 2017-06-27 ENCOUNTER — Encounter: Payer: Self-pay | Admitting: Internal Medicine

## 2017-06-27 ENCOUNTER — Ambulatory Visit (INDEPENDENT_AMBULATORY_CARE_PROVIDER_SITE_OTHER)
Admission: RE | Admit: 2017-06-27 | Discharge: 2017-06-27 | Disposition: A | Discharge status: Home / Self Care | Payer: Medicare Other | Source: Ambulatory Visit | Attending: Internal Medicine | Admitting: Internal Medicine

## 2017-06-27 ENCOUNTER — Ambulatory Visit (INDEPENDENT_AMBULATORY_CARE_PROVIDER_SITE_OTHER): Payer: Medicare Other | Admitting: Internal Medicine

## 2017-06-27 VITALS — BP 132/86 | HR 88 | Temp 98.4°F | Ht 63.0 in | Wt 150.0 lb

## 2017-06-27 DIAGNOSIS — M47816 Spondylosis without myelopathy or radiculopathy, lumbar region: Secondary | ICD-10-CM | POA: Diagnosis not present

## 2017-06-27 DIAGNOSIS — M5432 Sciatica, left side: Secondary | ICD-10-CM

## 2017-06-27 DIAGNOSIS — M12851 Other specific arthropathies, not elsewhere classified, right hip: Secondary | ICD-10-CM | POA: Diagnosis not present

## 2017-06-27 DIAGNOSIS — M25512 Pain in left shoulder: Secondary | ICD-10-CM

## 2017-06-27 DIAGNOSIS — M19012 Primary osteoarthritis, left shoulder: Secondary | ICD-10-CM | POA: Diagnosis not present

## 2017-06-27 DIAGNOSIS — M47817 Spondylosis without myelopathy or radiculopathy, lumbosacral region: Secondary | ICD-10-CM | POA: Diagnosis not present

## 2017-06-27 DIAGNOSIS — M12852 Other specific arthropathies, not elsewhere classified, left hip: Secondary | ICD-10-CM | POA: Diagnosis not present

## 2017-06-27 DIAGNOSIS — I10 Essential (primary) hypertension: Secondary | ICD-10-CM

## 2017-06-27 MED ORDER — GABAPENTIN 100 MG PO CAPS: 100.0000 mg | ORAL_CAPSULE | Freq: Three times a day (TID) | ORAL | 1 refills | Status: DC

## 2017-06-27 NOTE — Progress Notes (Signed)
Subjective:    Patient ID: Briana Schroeder, female    DOB: December 04, 1945, 71 y.o.   MRN: [id number]  HPI  Here with c/o left LBP worse x 1-2 mo with recurring tingling to left foot and toes, sharp and sometimes burning type, intermittent but getting more freqeunt and lasting longer, overall mild to mod during day but seems more severe with lying down at night, seems also worse to stand up, but better to walk, Alleve can help as well, nothing else makes better or worse.  Also has c/o left shoulder pain, mild, intermittent, sharp, worse to use the arm to forward elevate but nontender to touch, no swelling or rash  Has appt with rheum Jan 24, but felt she could not wait to then. Pt denies chest pain, increased sob or doe, wheezing, orthopnea, PND, increased LE swelling, palpitations, dizziness or syncope. Past Medical History:  Diagnosis Date  . ALLERGIC RHINITIS 08/02/2009  . Allergy   . ANXIETY 10/18/2008  . ARM PAIN, RIGHT 04/04/2009  . BACK PAIN 08/02/2009  . CHEST PAIN 12/05/2009  . COLONIC POLYPS, HX OF 10/18/2008  . DIZZINESS 12/05/2009  . EPIGASTRIC PAIN 01/09/2010  . FATIGUE 09/06/2009  . Fibroid   . GERD 12/06/2009  . HOARSENESS 09/06/2009  . HYPERLIPIDEMIA 10/18/2008  . HYPERTENSION 10/18/2008  . IBS 10/18/2008  . MYALGIA 03/21/2009  . Osteopenia    -1.06 Jul 2009  . PULMONARY SARCOIDOSIS 10/18/2008  . SINUSITIS- ACUTE-NOS 03/21/2009  . VOCAL CORD NODULE 09/06/2009   Past Surgical History:  Procedure Laterality Date  . ABDOMINAL HYSTERECTOMY  08/1992   TAH-DR G FOR FIBROIDS AND MENORRHAGIA  . COLONOSCOPY  11-24-2009   3 polyps, +TA and HPP  . hx of vocal cord nodules    . s/p right finger tendon surgury    . UPPER GASTROINTESTINAL ENDOSCOPY  01-08-2010   normal    reports that  has never smoked. she has never used smokeless tobacco. She reports that she does not drink alcohol or use drugs. family history includes Breast cancer in her mother; Diabetes in her mother; Hypertension in her mother  and sister. Allergies  Allergen Reactions  . Sertraline Hcl Diarrhea   Current Outpatient Medications on File Prior to Visit  Medication Sig Dispense Refill  . amLODipine (NORVASC) 10 MG tablet Take 1 tablet (10 mg total) daily by mouth. 90 tablet 3  . hydrochlorothiazide (HYDRODIURIL) 25 MG tablet Take 1 tablet (25 mg total) daily by mouth. 90 tablet 3  . Multiple Vitamin (MULTIVITAMIN) tablet Take 1 tablet by mouth daily.    . potassium chloride (K-DUR) 10 MEQ tablet Take 1 tablet (10 mEq total) daily by mouth. 90 tablet 3  . VITAMIN D, CHOLECALCIFEROL, PO Take 5,000 Units by mouth daily.     No current facility-administered medications on file prior to visit.    Review of Systems  Constitutional: Negative for other unusual diaphoresis or sweats HENT: Negative for ear discharge or swelling Eyes: Negative for other worsening visual disturbances Respiratory: Negative for stridor or other swelling  Gastrointestinal: Negative for worsening distension or other blood Genitourinary: Negative for retention or other urinary change Musculoskeletal: Negative for other MSK pain or swelling Skin: Negative for color change or other new lesions Neurological: Negative for worsening tremors and other numbness  Psychiatric/Behavioral: Negative for worsening agitation or other fatigue All other system neg per pt    Objective:   Physical Exam BP 132/86   Pulse 88   Temp 98.4 F (36.9  C) (Oral)   Ht 5\' 3"  (1.6 m)   Wt 150 lb (68 kg)   SpO2 98%   BMI 26.57 kg/m  VS noted,  Constitutional: Pt appears in NAD HENT: Head: NCAT.  Right Ear: External ear normal.  Left Ear: External ear normal.  Eyes: . Pupils are equal, round, and reactive to light. Conjunctivae and EOM are normal Nose: without d/c or deformity Neck: Neck supple. Gross normal ROM Cardiovascular: Normal rate and regular rhythm.   Pulmonary/Chest: Effort normal and breath sounds without rales or wheezing.  Abd:  Soft, NT, ND, +  BS, no organomegaly Spine: nontender in midline, has mild left paravertebral tender Neurological: Pt is alert. At baseline orientation, motor grossly intact Skin: Skin is warm. No rashes, other new lesions, no LE edema Psychiatric: Pt behavior is normal without agitation  No other exam findings Lab Results  Component Value Date   WBC 5.4 05/15/2017   HGB 13.4 05/15/2017   HCT 40.6 05/15/2017   PLT 273.0 05/15/2017   GLUCOSE 107 (H) 05/15/2017   CHOL 236 (H) 05/15/2017   TRIG 57.0 05/15/2017   HDL 88.20 05/15/2017   LDLDIRECT 83.3 11/17/2012   LDLCALC 136 (H) 05/15/2017   ALT 44 (H) 05/15/2017   AST 52 (H) 05/15/2017   NA 137 05/15/2017   K 4.3 05/15/2017   CL 99 05/15/2017   CREATININE 0.77 05/15/2017   BUN 18 05/15/2017   CO2 31 05/15/2017   TSH 1.08 05/15/2017   HGBA1C 6.4 07/29/2013       Assessment & Plan:

## 2017-06-27 NOTE — Patient Instructions (Addendum)
OK to take the Alleve for pain as needed  Please take all new medication as prescribed - the gabapentin as prescribed for nerve pain  Please continue all other medications as before, and refills have been done if requested.  Please have the pharmacy call with any other refills you may need.  Please consider seeing Sports Medicine in this office if not better in 1-2 wks  Please continue your efforts at being more active, low cholesterol diet, and weight control  Please keep your appointments with your specialists as you may have planned  Please go to the XRAY Department in the Basement (go straight as you get off the elevator) for the x-ray testing - the left shoulder, lower back, and hips  You will be contacted by phone if any changes need to be made immediately.  Otherwise, you will receive a letter about your results with an explanation, but please check with MyChart first.  Please remember to sign up for MyChart if you have not done so, as this will be important to you in the future with finding out test results, communicating by private email, and scheduling acute appointments online when needed.

## 2017-06-28 ENCOUNTER — Encounter: Payer: Self-pay | Admitting: Internal Medicine

## 2017-06-30 NOTE — Assessment & Plan Note (Addendum)
Mild to mod, cont nsaid, no neuro changes, will tx with gabapentin trial, will hold on MRI, pt to f/u with rheum as planned, consider sports medicine also if not improved

## 2017-06-30 NOTE — Assessment & Plan Note (Signed)
stable overall by history and exam, recent data reviewed with pt, and pt to continue medical treatment as before,  to f/u any worsening symptoms or concerns BP Readings from Last 3 Encounters:  06/27/17 132/86  05/15/17 126/86  10/13/16 118/71

## 2017-06-30 NOTE — Assessment & Plan Note (Signed)
Likely c/w DJD, for film today, exam benign, for nsaid prn, f/u rheum as planned

## 2017-07-25 DIAGNOSIS — Z6825 Body mass index (BMI) 25.0-25.9, adult: Secondary | ICD-10-CM | POA: Diagnosis not present

## 2017-07-25 DIAGNOSIS — M25552 Pain in left hip: Secondary | ICD-10-CM | POA: Diagnosis not present

## 2017-07-25 DIAGNOSIS — R748 Abnormal levels of other serum enzymes: Secondary | ICD-10-CM | POA: Diagnosis not present

## 2017-07-25 DIAGNOSIS — Z862 Personal history of diseases of the blood and blood-forming organs and certain disorders involving the immune mechanism: Secondary | ICD-10-CM | POA: Diagnosis not present

## 2017-07-25 DIAGNOSIS — M25512 Pain in left shoulder: Secondary | ICD-10-CM | POA: Diagnosis not present

## 2017-07-25 DIAGNOSIS — E663 Overweight: Secondary | ICD-10-CM | POA: Diagnosis not present

## 2017-07-30 DIAGNOSIS — M545 Low back pain: Secondary | ICD-10-CM | POA: Diagnosis not present

## 2017-07-30 DIAGNOSIS — M25551 Pain in right hip: Secondary | ICD-10-CM | POA: Diagnosis not present

## 2017-08-05 DIAGNOSIS — M25551 Pain in right hip: Secondary | ICD-10-CM | POA: Diagnosis not present

## 2017-08-05 DIAGNOSIS — M545 Low back pain: Secondary | ICD-10-CM | POA: Diagnosis not present

## 2017-08-13 DIAGNOSIS — M25551 Pain in right hip: Secondary | ICD-10-CM | POA: Diagnosis not present

## 2017-08-13 DIAGNOSIS — M545 Low back pain: Secondary | ICD-10-CM | POA: Diagnosis not present

## 2017-08-15 DIAGNOSIS — M25551 Pain in right hip: Secondary | ICD-10-CM | POA: Diagnosis not present

## 2017-08-15 DIAGNOSIS — M545 Low back pain: Secondary | ICD-10-CM | POA: Diagnosis not present

## 2017-08-20 DIAGNOSIS — M545 Low back pain: Secondary | ICD-10-CM | POA: Diagnosis not present

## 2017-08-20 DIAGNOSIS — M25551 Pain in right hip: Secondary | ICD-10-CM | POA: Diagnosis not present

## 2017-08-22 DIAGNOSIS — M25551 Pain in right hip: Secondary | ICD-10-CM | POA: Diagnosis not present

## 2017-08-22 DIAGNOSIS — M545 Low back pain: Secondary | ICD-10-CM | POA: Diagnosis not present

## 2017-08-28 DIAGNOSIS — M545 Low back pain: Secondary | ICD-10-CM | POA: Diagnosis not present

## 2017-08-28 DIAGNOSIS — M25551 Pain in right hip: Secondary | ICD-10-CM | POA: Diagnosis not present

## 2017-09-05 DIAGNOSIS — M25551 Pain in right hip: Secondary | ICD-10-CM | POA: Diagnosis not present

## 2017-09-05 DIAGNOSIS — M545 Low back pain: Secondary | ICD-10-CM | POA: Diagnosis not present

## 2017-09-12 DIAGNOSIS — M545 Low back pain: Secondary | ICD-10-CM | POA: Diagnosis not present

## 2017-09-12 DIAGNOSIS — M25551 Pain in right hip: Secondary | ICD-10-CM | POA: Diagnosis not present

## 2017-09-19 DIAGNOSIS — M545 Low back pain: Secondary | ICD-10-CM | POA: Diagnosis not present

## 2017-09-19 DIAGNOSIS — M25551 Pain in right hip: Secondary | ICD-10-CM | POA: Diagnosis not present

## 2017-09-24 DIAGNOSIS — M545 Low back pain: Secondary | ICD-10-CM | POA: Diagnosis not present

## 2017-09-24 DIAGNOSIS — M25551 Pain in right hip: Secondary | ICD-10-CM | POA: Diagnosis not present

## 2017-10-01 DIAGNOSIS — M545 Low back pain: Secondary | ICD-10-CM | POA: Diagnosis not present

## 2017-10-01 DIAGNOSIS — M25551 Pain in right hip: Secondary | ICD-10-CM | POA: Diagnosis not present

## 2017-10-03 DIAGNOSIS — M545 Low back pain: Secondary | ICD-10-CM | POA: Diagnosis not present

## 2017-10-03 DIAGNOSIS — M25551 Pain in right hip: Secondary | ICD-10-CM | POA: Diagnosis not present

## 2017-10-08 DIAGNOSIS — M545 Low back pain: Secondary | ICD-10-CM | POA: Diagnosis not present

## 2017-10-08 DIAGNOSIS — M25551 Pain in right hip: Secondary | ICD-10-CM | POA: Diagnosis not present

## 2017-10-10 DIAGNOSIS — M25551 Pain in right hip: Secondary | ICD-10-CM | POA: Diagnosis not present

## 2017-10-10 DIAGNOSIS — M545 Low back pain: Secondary | ICD-10-CM | POA: Diagnosis not present

## 2017-10-17 DIAGNOSIS — M25551 Pain in right hip: Secondary | ICD-10-CM | POA: Diagnosis not present

## 2017-10-17 DIAGNOSIS — M545 Low back pain: Secondary | ICD-10-CM | POA: Diagnosis not present

## 2017-10-29 DIAGNOSIS — M545 Low back pain: Secondary | ICD-10-CM | POA: Diagnosis not present

## 2017-10-29 DIAGNOSIS — M25551 Pain in right hip: Secondary | ICD-10-CM | POA: Diagnosis not present

## 2017-11-05 NOTE — Progress Notes (Addendum)
Subjective:   Briana Schroeder is a 72 y.o. female who presents for Medicare Annual (Subsequent) preventive examination.  Review of Systems:  No ROS.  Medicare Wellness Visit. Additional risk factors are reflected in the social history.    Sleep patterns: feels rested on waking, gets up 1 times nightly to void and sleeps 7 hours nightly.    Home Safety/Smoke Alarms: Feels safe in home. Smoke alarms in place.  Living environment; residence and Firearm Safety: 1-story house/ trailer, no firearms. Lives with husband, no needs for DME, good support system Seat Belt Safety/Bike Helmet: Wears seat belt.    Objective:     Vitals: There were no vitals taken for this visit.  There is no height or weight on file to calculate BMI.  Advanced Directives 10/13/2016 09/04/2016 04/26/2015 04/14/2015 11/29/2014  Does Patient Have a Medical Advance Directive? No Yes No No No  Does patient want to make changes to medical advance directive? - Yes (ED - Information included in AVS) - - -  Would patient like information on creating a medical advance directive? No - Patient declined - No - patient declined information - No - patient declined information    Tobacco Social History   Tobacco Use  Smoking Status Never Smoker  Smokeless Tobacco Never Used     Counseling given: Not Answered   Past Medical History:  Diagnosis Date  . ALLERGIC RHINITIS 08/02/2009  . Allergy   . ANXIETY 10/18/2008  . ARM PAIN, RIGHT 04/04/2009  . BACK PAIN 08/02/2009  . CHEST PAIN 12/05/2009  . COLONIC POLYPS, HX OF 10/18/2008  . DIZZINESS 12/05/2009  . EPIGASTRIC PAIN 01/09/2010  . FATIGUE 09/06/2009  . Fibroid   . GERD 12/06/2009  . HOARSENESS 09/06/2009  . HYPERLIPIDEMIA 10/18/2008  . HYPERTENSION 10/18/2008  . IBS 10/18/2008  . MYALGIA 03/21/2009  . Osteopenia    -1.06 Jul 2009  . PULMONARY SARCOIDOSIS 10/18/2008  . SINUSITIS- ACUTE-NOS 03/21/2009  . VOCAL CORD NODULE 09/06/2009   Past Surgical History:  Procedure Laterality  Date  . ABDOMINAL HYSTERECTOMY  08/1992   TAH-DR G FOR FIBROIDS AND MENORRHAGIA  . COLONOSCOPY  11-24-2009   3 polyps, +TA and HPP  . hx of vocal cord nodules    . s/p right finger tendon surgury    . UPPER GASTROINTESTINAL ENDOSCOPY  01-08-2010   normal   Family History  Problem Relation Age of Onset  . Diabetes Mother   . Hypertension Mother   . Breast cancer Mother   . Hypertension Sister   . Colon cancer Neg Hx   . Colon polyps Neg Hx   . Rectal cancer Neg Hx   . Stomach cancer Neg Hx    Social History   Socioeconomic History  . Marital status: Married    Spouse name: Not on file  . Number of children: 2  . Years of education: Not on file  . Highest education level: Not on file  Occupational History  . Occupation: Educational psychologist  Social Needs  . Financial resource strain: Not on file  . Food insecurity:    Worry: Not on file    Inability: Not on file  . Transportation needs:    Medical: Not on file    Non-medical: Not on file  Tobacco Use  . Smoking status: Never Smoker  . Smokeless tobacco: Never Used  Substance and Sexual Activity  . Alcohol use: No    Alcohol/week: 0.0 oz  . Drug use: No  .  Sexual activity: Yes    Birth control/protection: Surgical  Lifestyle  . Physical activity:    Days per week: Not on file    Minutes per session: Not on file  . Stress: Not on file  Relationships  . Social connections:    Talks on phone: Not on file    Gets together: Not on file    Attends religious service: Not on file    Active member of club or organization: Not on file    Attends meetings of clubs or organizations: Not on file    Relationship status: Not on file  Other Topics Concern  . Not on file  Social History Narrative  . Not on file    Outpatient Encounter Medications as of 11/06/2017  Medication Sig  . amLODipine (NORVASC) 10 MG tablet Take 1 tablet (10 mg total) daily by mouth.  . gabapentin (NEURONTIN) 100 MG capsule Take 1 capsule  (100 mg total) by mouth 3 (three) times daily.  . hydrochlorothiazide (HYDRODIURIL) 25 MG tablet Take 1 tablet (25 mg total) daily by mouth.  . Multiple Vitamin (MULTIVITAMIN) tablet Take 1 tablet by mouth daily.  . potassium chloride (K-DUR) 10 MEQ tablet Take 1 tablet (10 mEq total) daily by mouth.  Marland Kitchen VITAMIN D, CHOLECALCIFEROL, PO Take 5,000 Units by mouth daily.   No facility-administered encounter medications on file as of 11/06/2017.     Activities of Daily Living No flowsheet data found.  Patient Care Team: Biagio Borg, MD as PCP - General    Assessment:   This is a routine wellness examination for Briana Schroeder. Physical assessment deferred to PCP.  Exercise Activities and Dietary recommendations   Diet (meal preparation, eat out, water intake, caffeinated beverages, dairy products, fruits and vegetables): in general, a "healthy" diet  , well balanced   Reviewed heart healthy diet, encouraged patient to increase daily water intake. Diet education was attached to patient's AVS.    Goals    None      Fall Risk Fall Risk  05/15/2017 09/04/2016 02/09/2016 12/15/2014 11/19/2013  Falls in the past year? No No No No No    Depression Screen PHQ 2/9 Scores 05/15/2017 09/04/2016 02/09/2016 12/15/2014  PHQ - 2 Score 0 0 0 0     Cognitive Function       Ad8 score reviewed for issues:  Issues making decisions: no  Less interest in hobbies / activities: no  Repeats questions, stories (family complaining): no  Trouble using ordinary gadgets (microwave, computer, phone):no  Forgets the month or year: no  Mismanaging finances: no  Remembering appts: no  Daily problems with thinking and/or memory: no Ad8 score is= 0  Immunization History  Administered Date(s) Administered  . Pneumococcal Conjugate-13 05/21/2013  . Pneumococcal Polysaccharide-23 09/06/2009  . Td 10/18/2008  . Zoster 10/18/2008   Screening Tests Health Maintenance  Topic Date Due  . PNA vac Low Risk  Adult (2 of 2 - PPSV23) 09/07/2014  . INFLUENZA VACCINE  01/30/2018  . TETANUS/TDAP  10/19/2018  . MAMMOGRAM  11/28/2018  . COLONOSCOPY  04/25/2020  . DEXA SCAN  Completed  . Hepatitis C Screening  Completed      Plan:     I have personally reviewed and noted the following in the patient's chart:   . Medical and social history . Use of alcohol, tobacco or illicit drugs  . Current medications and supplements . Functional ability and status . Nutritional status . Physical activity . Advanced directives . List  of other physicians . Vitals . Screenings to include cognitive, depression, and falls . Referrals and appointments  In addition, I have reviewed and discussed with patient certain preventive protocols, quality metrics, and best practice recommendations. A written personalized care plan for preventive services as well as general preventive health recommendations were provided to patient.     Michiel Cowboy, RN  11/05/2017 Medical screening examination/treatment/procedure(s) were performed by non-physician practitioner and as supervising physician I was immediately available for consultation/collaboration. I agree with above. Cathlean Cower, MD

## 2017-11-06 ENCOUNTER — Ambulatory Visit (INDEPENDENT_AMBULATORY_CARE_PROVIDER_SITE_OTHER): Payer: Medicare Other | Admitting: *Deleted

## 2017-11-06 VITALS — BP 134/62 | HR 60 | Resp 18 | Ht 63.0 in | Wt 148.0 lb

## 2017-11-06 DIAGNOSIS — Z0000 Encounter for general adult medical examination without abnormal findings: Secondary | ICD-10-CM | POA: Diagnosis not present

## 2017-11-06 DIAGNOSIS — Z23 Encounter for immunization: Secondary | ICD-10-CM

## 2017-11-06 NOTE — Patient Instructions (Signed)
Continue doing brain stimulating activities (puzzles, reading, adult coloring books, staying active) to keep memory sharp.   Continue to eat heart healthy diet (full of fruits, vegetables, whole grains, lean protein, water--limit salt, fat, and sugar intake) and increase physical activity as tolerated.   [name] , Thank you for taking time to come for your Medicare Wellness Visit. I appreciate your ongoing commitment to your health goals. Please review the following plan we discussed and let me know if I can assist you in the future.   These are the goals we discussed: Goals    . 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        This is a list of the screening recommended for you and due dates:  Health Maintenance  Topic Date Due  . Pneumonia vaccines (2 of 2 - PPSV23) 09/07/2014  . Flu Shot  01/30/2018  . Tetanus Vaccine  10/19/2018  . Mammogram  11/28/2018  . Colon Cancer Screening  04/25/2020  . DEXA scan (bone density measurement)  Completed  .  Hepatitis C: One time screening is recommended by Center for Disease Control  (CDC) for  adults born from [birth year] through 1966.   Completed    Fat and Cholesterol Restricted Diet Getting too much fat and cholesterol in your diet may cause health problems. Following this diet helps keep your fat and cholesterol at normal levels. This can keep you from getting sick. What types of fat should I choose?  Choose monosaturated and polyunsaturated fats. These are found in foods such as olive oil, canola oil, flaxseeds, walnuts, almonds, and seeds.  Eat more omega-3 fats. Good choices include salmon, mackerel, sardines, tuna, flaxseed oil, and ground flaxseeds.  Limit saturated fats. These are in animal products such as meats, butter, and cream. They can also be in plant products such as palm oil, palm kernel oil, and coconut  oil.  Avoid foods with partially hydrogenated oils in them. These contain trans fats. Examples of foods that have trans fats are stick margarine, some tub margarines, cookies, crackers, and other baked goods. What general guidelines do I need to follow?  Check food labels. Look for the words "trans fat" and "saturated fat."  When preparing a meal: ? Fill half of your plate with vegetables and green salads. ? Fill one fourth of your plate with whole grains. Look for the word "whole" as the first word in the ingredient list. ? Fill one fourth of your plate with lean protein foods.  Eat more foods that have fiber, like apples, carrots, beans, peas, and barley.  Eat more home-cooked foods. Eat less at restaurants and buffets.  Limit or avoid alcohol.  Limit foods high in starch and sugar.  Limit fried foods.  Cook foods without frying them. Baking, boiling, grilling, and broiling are all great options.  Lose weight if you are overweight. Losing even a small amount of weight can help your overall health. It can also help prevent diseases such as diabetes and heart disease. What foods can I eat? Grains Whole grains, such as whole wheat or whole grain breads, crackers, cereals, and pasta. Unsweetened oatmeal, bulgur, barley, quinoa, or brown rice. Corn or whole wheat flour tortillas. Vegetables Fresh or frozen vegetables (raw, steamed, roasted, or grilled). Green salads. Fruits All fresh, canned (in natural juice), or frozen fruits. Meat and Other  Protein Products Ground beef (85% or leaner), grass-fed beef, or beef trimmed of fat. Skinless chicken or [place]. Ground chicken or [place]. Pork trimmed of fat. All fish and seafood. Eggs. Dried beans, peas, or lentils. Unsalted nuts or seeds. Unsalted canned or dry beans. Dairy Low-fat dairy products, such as skim or 1% milk, 2% or reduced-fat cheeses, low-fat ricotta or cottage cheese, or plain low-fat yogurt. Fats and Oils Tub margarines  without trans fats. Light or reduced-fat mayonnaise and salad dressings. Avocado. Olive, canola, sesame, or safflower oils. Natural peanut or almond butter (choose ones without added sugar and oil). The items listed above may not be a complete list of recommended foods or beverages. Contact your dietitian for more options. What foods are not recommended? Grains White bread. White pasta. White rice. Cornbread. Bagels, pastries, and croissants. Crackers that contain trans fat. Vegetables White potatoes. Corn. Creamed or fried vegetables. Vegetables in a cheese sauce. Fruits Dried fruits. Canned fruit in light or heavy syrup. Fruit juice. Meat and Other Protein Products Fatty cuts of meat. Ribs, chicken wings, bacon, sausage, bologna, salami, chitterlings, fatback, hot dogs, bratwurst, and packaged luncheon meats. Liver and organ meats. Dairy Whole or 2% milk, cream, half-and-half, and cream cheese. Whole milk cheeses. Whole-fat or sweetened yogurt. Full-fat cheeses. Nondairy creamers and whipped toppings. Processed cheese, cheese spreads, or cheese curds. Sweets and Desserts Corn syrup, sugars, honey, and molasses. Candy. Jam and jelly. Syrup. Sweetened cereals. Cookies, pies, cakes, donuts, muffins, and ice cream. Fats and Oils Butter, stick margarine, lard, shortening, ghee, or bacon fat. Coconut, palm kernel, or palm oils. Beverages Alcohol. Sweetened drinks (such as sodas, lemonade, and fruit drinks or punches). The items listed above may not be a complete list of foods and beverages to avoid. Contact your dietitian for more information. This information is not intended to replace advice given to you by your health care provider. Make sure you discuss any questions you have with your health care provider. Document Released: 12/18/2011 Document Revised: 02/23/2016 Document Reviewed: 09/17/2013 Elsevier Interactive Patient Education  [employer].

## 2017-11-07 DIAGNOSIS — M25551 Pain in right hip: Secondary | ICD-10-CM | POA: Diagnosis not present

## 2017-11-07 DIAGNOSIS — M545 Low back pain: Secondary | ICD-10-CM | POA: Diagnosis not present

## 2017-11-08 DIAGNOSIS — Z6826 Body mass index (BMI) 26.0-26.9, adult: Secondary | ICD-10-CM | POA: Diagnosis not present

## 2017-11-08 DIAGNOSIS — M25552 Pain in left hip: Secondary | ICD-10-CM | POA: Diagnosis not present

## 2017-11-08 DIAGNOSIS — E663 Overweight: Secondary | ICD-10-CM | POA: Diagnosis not present

## 2017-11-08 DIAGNOSIS — M25512 Pain in left shoulder: Secondary | ICD-10-CM | POA: Diagnosis not present

## 2017-11-08 DIAGNOSIS — R748 Abnormal levels of other serum enzymes: Secondary | ICD-10-CM | POA: Diagnosis not present

## 2017-11-08 DIAGNOSIS — Z862 Personal history of diseases of the blood and blood-forming organs and certain disorders involving the immune mechanism: Secondary | ICD-10-CM | POA: Diagnosis not present

## 2017-11-11 ENCOUNTER — Encounter: Payer: Self-pay | Admitting: Neurology

## 2017-11-12 ENCOUNTER — Other Ambulatory Visit: Payer: Self-pay | Admitting: *Deleted

## 2017-11-12 DIAGNOSIS — R748 Abnormal levels of other serum enzymes: Secondary | ICD-10-CM

## 2017-11-12 DIAGNOSIS — R200 Anesthesia of skin: Secondary | ICD-10-CM

## 2017-11-19 ENCOUNTER — Ambulatory Visit (INDEPENDENT_AMBULATORY_CARE_PROVIDER_SITE_OTHER): Payer: Medicare Other | Admitting: Neurology

## 2017-11-19 DIAGNOSIS — M79605 Pain in left leg: Secondary | ICD-10-CM

## 2017-11-19 DIAGNOSIS — R748 Abnormal levels of other serum enzymes: Secondary | ICD-10-CM | POA: Diagnosis not present

## 2017-11-19 NOTE — Procedures (Signed)
[facility]  [place]  [place], [place] [address] Tel: [phone] Fax:  [phone] Test Date:  11/19/2017  Patient: Briana Schroeder DOB: August 10, 1945 Physician: Narda Amber, DO  Sex: Female Height: 5\' 3"  Ref Phys: Leigh Aurora, MD  ID#: [id number] Temp: 34.1C Technician:    Patient Complaints: This is a 72 year old female referred for evaluation of bilateral leg pain and hyperCKemia.  NCV & EMG Findings: Extensive electrodiagnostic testing of the left lower extremity and additional studies of the right shows:  1. Bilateral sural and superficial peroneal sensory responses are within normal limits. 2. Right peroneal motor response at the extensor digitorum brevis is asymmetrically absent, and normal at the tibialis anterior. Left peroneal and bilateral tibial motor responses are within normal limits. 3. Left tibial H reflex study is within normal limits per 4. Chronic motor axon loss changes are seen affecting the L5 myotome on the right, without accompanied active denervation. These findings are not present in the left lower extremity; in particular, motor unit configuration and recruitment pattern is within normal limits in the left lower extremity, including lumbar paraspinal muscles.   Impression: 1. Chronic L5 radiculopathy affecting the RIGHT lower extremity, mild-to-moderate in degree electrically. 2. There is no evidence of a LEFT lumbosacral radiculopathy, sensorimotor polyneuropathy, or diffuse myopathy.   ___________________________ Narda Amber, DO    Nerve Conduction Studies Anti Sensory Summary Table   Site NR Peak (ms) Norm Peak (ms) P-T Amp (V) Norm P-T Amp  Left Sup Peroneal Anti Sensory (Ant Lat Mall)  34.1C  12 cm    2.7 <4.6 6.1 >3  Right Sup Peroneal Anti Sensory (Ant Lat Mall)  34.1C  12 cm    2.8 <4.6 9.3 >3  Left Sural Anti Sensory (Lat Mall)  34.1C  Calf    2.6 <4.6 10.9 >3  Right Sural Anti Sensory (Lat Mall)   34.1C  Calf    3.1 <4.6 8.9 >3   Motor Summary Table   Site NR Onset (ms) Norm Onset (ms) O-P Amp (mV) Norm O-P Amp Site1 Site2 Delta-0 (ms) Dist (cm) Vel (m/s) Norm Vel (m/s)  Left Peroneal Motor (Ext Dig Brev)  34.1C  Ankle    4.6 <6.0 4.0 >2.5 B Fib Ankle 7.1 36.0 51 >40  B Fib    11.7  3.9  Poplt B Fib 1.4 8.0 57 >40  Poplt    13.1  3.8         Right Peroneal Motor (Ext Dig Brev)  34.1C  Ankle NR  <6.0  >2.5 B Fib Ankle  0.0  >40  B Fib NR     Poplt B Fib  0.0  >40  Poplt NR            Left Peroneal TA Motor (Tib Ant)  34.1C  Fib Head    3.0 <4.5 4.1 >3 Poplit Fib Head 1.4 8.0 57 >40  Poplit    4.4  4.1         Right Peroneal TA Motor (Tib Ant)  34.1C  Fib Head    2.3 <4.5 4.2 >3 Poplit Fib Head 1.1 8.0 73 >40  Poplit    3.4  4.2         Left Tibial Motor (Abd Hall Brev)  34.1C  Ankle    3.3 <6.0 5.8 >4 Knee Ankle 7.3 36.0 49 >40  Knee    10.6  3.0         Right Tibial Motor (Abd  Hall Brev)  34.1C  Ankle    3.4 <6.0 7.2 >4 Knee Ankle 8.3 35.0 42 >40  Knee    11.7  5.2          H Reflex Studies   NR H-Lat (ms) Lat Norm (ms) L-R H-Lat (ms)  Left Tibial (Gastroc)  34.1C     29.66 <35    EMG   Side Muscle Ins Act Fibs Psw Fasc Number Recrt Dur Dur. Amp Amp. Poly Poly. Comment  Right Flex Dig Long Nml Nml Nml Nml 1- Rapid Some 1+ Some 1+ Nml Nml N/A  Right AntTibialis Nml Nml Nml Nml 1- Rapid Some 1+ Some 1+ Nml Nml N/A  Right Gastroc Nml Nml Nml Nml Nml Nml Nml Nml Nml Nml Nml Nml N/A  Right RectFemoris Nml Nml Nml Nml Nml Nml Nml Nml Nml Nml Nml Nml N/A  Right GluteusMed Nml Nml Nml Nml 1- Rapid Some 1+ Some 1+ Nml Nml N/A  Right BicepsFemS Nml Nml Nml Nml Nml Nml Nml Nml Nml Nml Nml Nml N/A  Left AntTibialis Nml Nml Nml Nml Nml Nml Nml Nml Nml Nml Nml Nml N/A  Left BicepsFemS Nml Nml Nml Nml Nml Nml Nml Nml Nml Nml Nml Nml N/A  Left Gastroc Nml Nml Nml Nml Nml Nml Nml Nml Nml Nml Nml Nml N/A  Left Flex Dig Long Nml Nml Nml Nml Nml Nml Nml Nml Nml Nml Nml Nml N/A   Left RectFemoris Nml Nml Nml Nml Nml Nml Nml Nml Nml Nml Nml Nml N/A  Left GluteusMed Nml Nml Nml Nml Nml Nml Nml Nml Nml Nml Nml Nml N/A  Left Lumbo Parasp Low Nml Nml Nml Nml Nml Nml Nml Nml Nml Nml Nml Nml N/A      Waveforms:

## 2018-05-15 ENCOUNTER — Other Ambulatory Visit: Payer: Self-pay | Admitting: Internal Medicine

## 2018-06-17 ENCOUNTER — Ambulatory Visit (INDEPENDENT_AMBULATORY_CARE_PROVIDER_SITE_OTHER): Payer: Medicare Other | Admitting: Internal Medicine

## 2018-06-17 ENCOUNTER — Encounter: Payer: Self-pay | Admitting: Internal Medicine

## 2018-06-17 ENCOUNTER — Other Ambulatory Visit (INDEPENDENT_AMBULATORY_CARE_PROVIDER_SITE_OTHER): Payer: Medicare Other

## 2018-06-17 VITALS — BP 122/82 | HR 82 | Temp 98.2°F | Ht 63.0 in | Wt 135.0 lb

## 2018-06-17 DIAGNOSIS — E785 Hyperlipidemia, unspecified: Secondary | ICD-10-CM | POA: Diagnosis not present

## 2018-06-17 DIAGNOSIS — R739 Hyperglycemia, unspecified: Secondary | ICD-10-CM

## 2018-06-17 DIAGNOSIS — R748 Abnormal levels of other serum enzymes: Secondary | ICD-10-CM

## 2018-06-17 DIAGNOSIS — I10 Essential (primary) hypertension: Secondary | ICD-10-CM

## 2018-06-17 DIAGNOSIS — R202 Paresthesia of skin: Secondary | ICD-10-CM

## 2018-06-17 DIAGNOSIS — J309 Allergic rhinitis, unspecified: Secondary | ICD-10-CM

## 2018-06-17 LAB — URINALYSIS, ROUTINE W REFLEX MICROSCOPIC
BILIRUBIN URINE: NEGATIVE
HGB URINE DIPSTICK: NEGATIVE
Ketones, ur: NEGATIVE
Nitrite: NEGATIVE
RBC / HPF: NONE SEEN (ref 0–?)
Specific Gravity, Urine: 1.02 (ref 1.000–1.030)
Total Protein, Urine: NEGATIVE
UROBILINOGEN UA: 0.2 (ref 0.0–1.0)
Urine Glucose: NEGATIVE
pH: 6 (ref 5.0–8.0)

## 2018-06-17 LAB — HEMOGLOBIN A1C: Hgb A1c MFr Bld: 6.2 % (ref 4.6–6.5)

## 2018-06-17 LAB — CBC WITH DIFFERENTIAL/PLATELET
Basophils Absolute: 0.1 10*3/uL (ref 0.0–0.1)
Basophils Relative: 1.4 % (ref 0.0–3.0)
Eosinophils Absolute: 0.1 10*3/uL (ref 0.0–0.7)
Eosinophils Relative: 2.5 % (ref 0.0–5.0)
HCT: 38.4 % (ref 36.0–46.0)
Hemoglobin: 13 g/dL (ref 12.0–15.0)
Lymphocytes Relative: 32.7 % (ref 12.0–46.0)
Lymphs Abs: 1.6 10*3/uL (ref 0.7–4.0)
MCHC: 33.7 g/dL (ref 30.0–36.0)
MCV: 90.3 fl (ref 78.0–100.0)
MONO ABS: 0.4 10*3/uL (ref 0.1–1.0)
Monocytes Relative: 8.6 % (ref 3.0–12.0)
Neutro Abs: 2.6 10*3/uL (ref 1.4–7.7)
Neutrophils Relative %: 54.8 % (ref 43.0–77.0)
Platelets: 289 10*3/uL (ref 150.0–400.0)
RBC: 4.26 Mil/uL (ref 3.87–5.11)
RDW: 14.6 % (ref 11.5–15.5)
WBC: 4.8 10*3/uL (ref 4.0–10.5)

## 2018-06-17 LAB — HEPATIC FUNCTION PANEL
ALT: 35 U/L (ref 0–35)
AST: 42 U/L — ABNORMAL HIGH (ref 0–37)
Albumin: 4.2 g/dL (ref 3.5–5.2)
Alkaline Phosphatase: 23 U/L — ABNORMAL LOW (ref 39–117)
BILIRUBIN DIRECT: 0.1 mg/dL (ref 0.0–0.3)
TOTAL PROTEIN: 7.2 g/dL (ref 6.0–8.3)
Total Bilirubin: 0.4 mg/dL (ref 0.2–1.2)

## 2018-06-17 LAB — LIPID PANEL
Cholesterol: 202 mg/dL — ABNORMAL HIGH (ref 0–200)
HDL: 76.6 mg/dL (ref >39.00)
LDL Cholesterol: 114 mg/dL — ABNORMAL HIGH (ref 0–99)
NonHDL: 125.88
Total CHOL/HDL Ratio: 3
Triglycerides: 61 mg/dL (ref 0.0–149.0)
VLDL: 12.2 mg/dL (ref 0.0–40.0)

## 2018-06-17 LAB — BASIC METABOLIC PANEL
BUN: 20 mg/dL (ref 6–23)
CO2: 29 mEq/L (ref 19–32)
Calcium: 9.7 mg/dL (ref 8.4–10.5)
Chloride: 102 mEq/L (ref 96–112)
Creatinine, Ser: 0.73 mg/dL (ref 0.40–1.20)
GFR: 100.3 mL/min (ref >60.00)
Glucose, Bld: 100 mg/dL — ABNORMAL HIGH (ref 70–99)
Potassium: 3.8 mEq/L (ref 3.5–5.1)
Sodium: 139 mEq/L (ref 135–145)

## 2018-06-17 LAB — TSH: TSH: 0.65 u[IU]/mL (ref 0.35–4.50)

## 2018-06-17 LAB — CK: Total CK: 1126 U/L — ABNORMAL HIGH (ref 7–177)

## 2018-06-17 MED ORDER — POTASSIUM CHLORIDE ER 10 MEQ PO TBCR: 10.0000 meq | EXTENDED_RELEASE_TABLET | Freq: Every day | ORAL | 3 refills | Status: DC

## 2018-06-17 MED ORDER — AMLODIPINE BESYLATE 10 MG PO TABS: 10.0000 mg | ORAL_TABLET | Freq: Every day | ORAL | 3 refills | Status: DC

## 2018-06-17 MED ORDER — GABAPENTIN 300 MG PO CAPS: 300.0000 mg | ORAL_CAPSULE | Freq: Every day | ORAL | 1 refills | Status: DC

## 2018-06-17 MED ORDER — HYDROCHLOROTHIAZIDE 25 MG PO TABS: 25.0000 mg | ORAL_TABLET | Freq: Every day | ORAL | 3 refills | Status: DC

## 2018-06-17 NOTE — Assessment & Plan Note (Addendum)
Asympt, for f/u ck  Note:  Total time for pt hx, exam, review of record with pt in the room, determination of diagnoses and plan for further eval and tx is > 40 min, with over 50% spent in coordination and counseling of patient including the differential dx, tx, further evaluation and other management of elevated CK, HLD, HTN, hyperglycemia, allergic rhinitis and leg paresthesias

## 2018-06-17 NOTE — Assessment & Plan Note (Signed)
Mild to mod, for allegra and nasacort asd,   to f/u any worsening symptoms or concerns 

## 2018-06-17 NOTE — Patient Instructions (Signed)
Ok to take the gabapentin at 300 mg at bedtime, but ok to stop after one week if you do not see any improvement  Please continue all other medications as before, and refills have been done if requested.  Please have the pharmacy call with any other refills you may need.  Please continue your efforts at being more active, low cholesterol diet, and weight control.  You are otherwise up to date with prevention measures today.  Please keep your appointments with your specialists as you may have planned  Please go to the LAB in the Basement (turn left off the elevator) for the tests to be done today  You will be contacted by phone if any changes need to be made immediately.  Otherwise, you will receive a letter about your results with an explanation, but please check with MyChart first.  Please remember to sign up for MyChart if you have not done so, as this will be important to you in the future with finding out test results, communicating by private email, and scheduling acute appointments online when needed.  Please return in 1 year for your yearly visit, or sooner if needed

## 2018-06-17 NOTE — Assessment & Plan Note (Signed)
stable overall by history and exam, recent data reviewed with pt, and pt to continue medical treatment as before,  to f/u any worsening symptoms or concerns  

## 2018-06-17 NOTE — Progress Notes (Signed)
Subjective:    Patient ID: Briana Schroeder, female    DOB: October 29, 1945, 72 y.o.   MRN: [id number]  HPI  Here to f/u; overall doing ok,  Pt denies chest pain, increasing sob or doe, wheezing, orthopnea, PND, increased LE swelling, palpitations, dizziness or syncope.  Pt denies new neurological symptoms such as new headache, or facial or extremity weakness but has tingling numbness and discomfort to bilat distal LE's worst before going to bed.  Pt denies polydipsia, polyuria.  Does have several wks ongoing nasal allergy symptoms with clearish congestion, itch and sneezing, without fever, pain, ST, cough, swelling or wheezing.   Past Medical History:  Diagnosis Date  . ALLERGIC RHINITIS 08/02/2009  . Allergy   . ANXIETY 10/18/2008  . ARM PAIN, RIGHT 04/04/2009  . BACK PAIN 08/02/2009  . CHEST PAIN 12/05/2009  . COLONIC POLYPS, HX OF 10/18/2008  . DIZZINESS 12/05/2009  . EPIGASTRIC PAIN 01/09/2010  . FATIGUE 09/06/2009  . Fibroid   . GERD 12/06/2009  . HOARSENESS 09/06/2009  . HYPERLIPIDEMIA 10/18/2008  . HYPERTENSION 10/18/2008  . IBS 10/18/2008  . MYALGIA 03/21/2009  . Osteopenia    -1.06 Jul 2009  . PULMONARY SARCOIDOSIS 10/18/2008  . SINUSITIS- ACUTE-NOS 03/21/2009  . VOCAL CORD NODULE 09/06/2009   Past Surgical History:  Procedure Laterality Date  . ABDOMINAL HYSTERECTOMY  08/1992   TAH-DR G FOR FIBROIDS AND MENORRHAGIA  . COLONOSCOPY  11-24-2009   3 polyps, +TA and HPP  . hx of vocal cord nodules    . s/p right finger tendon surgury    . UPPER GASTROINTESTINAL ENDOSCOPY  01-08-2010   normal    reports that she has never smoked. She has never used smokeless tobacco. She reports that she does not drink alcohol or use drugs. family history includes Breast cancer in her mother; Diabetes in her mother; Hypertension in her mother and sister. Allergies  Allergen Reactions  . Sertraline Hcl Diarrhea   Current Outpatient Medications on File Prior to Visit  Medication Sig Dispense Refill  . Multiple  Vitamin (MULTIVITAMIN) tablet Take 1 tablet by mouth daily.    Marland Kitchen VITAMIN D, CHOLECALCIFEROL, PO Take 5,000 Units by mouth daily.     No current facility-administered medications on file prior to visit.    Review of Systems  Constitutional: Negative for other unusual diaphoresis or sweats HENT: Negative for ear discharge or swelling Eyes: Negative for other worsening visual disturbances Respiratory: Negative for stridor or other swelling  Gastrointestinal: Negative for worsening distension or other blood Genitourinary: Negative for retention or other urinary change Musculoskeletal: Negative for other MSK pain or swelling Skin: Negative for color change or other new lesions Neurological: Negative for worsening tremors and other numbness  Psychiatric/Behavioral: Negative for worsening agitation or other fatigue All other system neg per pt    Objective:   Physical Exam BP 122/82   Pulse 82   Temp 98.2 F (36.8 C) (Oral)   Ht 5\' 3"  (1.6 m)   Wt 135 lb (61.2 kg)   SpO2 98%   BMI 23.91 kg/m  VS noted,  Constitutional: Pt appears in NAD HENT: Head: NCAT.  Right Ear: External ear normal.  Left Ear: External ear normal.  Eyes: . Pupils are equal, round, and reactive to light. Conjunctivae and EOM are normal Nose: without d/c or deformity Bilat tm's with mild erythema.  Max sinus areas non tender.  Pharynx with mild erythema, no exudate Neck: Neck supple. Gross normal ROM Cardiovascular: Normal rate and  regular rhythm.   Pulmonary/Chest: Effort normal and breath sounds without rales or wheezing.  Abd:  Soft, NT, ND, + BS, no organomegaly Neurological: Pt is alert. At baseline orientation, motor grossly intact Skin: Skin is warm. No rashes, other new lesions, no LE edema Psychiatric: Pt behavior is normal without agitation  No other exam findings Lab Results  Component Value Date   WBC 4.8 06/17/2018   HGB 13.0 06/17/2018   HCT 38.4 06/17/2018   PLT 289.0 06/17/2018   GLUCOSE  100 (H) 06/17/2018   CHOL 202 (H) 06/17/2018   TRIG 61.0 06/17/2018   HDL 76.60 06/17/2018   LDLDIRECT 83.3 11/17/2012   LDLCALC 114 (H) 06/17/2018   ALT 35 06/17/2018   AST 42 (H) 06/17/2018   NA 139 06/17/2018   K 3.8 06/17/2018   CL 102 06/17/2018   CREATININE 0.73 06/17/2018   BUN 20 06/17/2018   CO2 29 06/17/2018   TSH 0.65 06/17/2018   HGBA1C 6.2 06/17/2018       Assessment & Plan:

## 2018-06-17 NOTE — Assessment & Plan Note (Signed)
C/w possible neuritic - for gabapentin 300 qhs

## 2018-07-17 DIAGNOSIS — H2513 Age-related nuclear cataract, bilateral: Secondary | ICD-10-CM | POA: Diagnosis not present

## 2018-08-20 ENCOUNTER — Ambulatory Visit: Payer: Medicare Other | Admitting: Gynecology

## 2018-08-26 ENCOUNTER — Ambulatory Visit: Payer: Medicare Other | Admitting: Gynecology

## 2018-08-28 ENCOUNTER — Ambulatory Visit: Payer: Medicare Other | Admitting: Gynecology

## 2018-09-01 ENCOUNTER — Ambulatory Visit: Payer: Self-pay

## 2018-09-01 NOTE — Telephone Encounter (Signed)
Incoming  Call from  Patient  Who  States  That  Her  Stomach  In the  [place] area has  Been  Hurting  Her.  She  Has  Been passing  Gas,  yet it still hurts.  States that  It  Comes and goes.   This  Is  The  First  Occurrence.  Stress is  Maybe  An  aggravating  factor.  Denies   Vomiting  Nausea.  Patient  Is  inquiring if  She should  Continue  With alka Seltza or  Try GasX. Until  She  Sees  Dr.  Cathlean Cower?   Patient  States  No reason  To  switch  .  Recommended sticking  To  One  medication  until appointment.  Patient  Voiced  Understanding.  Will Keep  Her  Appointment  For 09/05/18 with  Dr.  Cathlean Cower. Reason for Disposition . [1] MILD-MODERATE pain AND [2] not relieved by antacids  Answer Assessment - Initial Assessment Questions 1. LOCATION: "Where does it hurt?"      Middle  2. RADIATION: "Does the pain shoot anywhere else?" (e.g., chest, back)     Middle 3. ONSET: "When did the pain begin?" (e.g., minutes, hours or days ago)      2 weeks  ago 4. SUDDEN: "Gradual or sudden onset?"     Just  Starts hurting 5. PATTERN "Does the pain come and go, or is it constant?"    - If constant: "Is it getting better, staying the same, or worsening?"      (Note: Constant means the pain never goes away completely; most serious pain is constant and it progresses)     - If intermittent: "How long does it last?" "Do you have pain now?"     (Note: Intermittent means the pain goes away completely between bouts)     Comes  And  Goes   6. SEVERITY: "How bad is the pain?"  (e.g., Scale 1-10; mild, moderate, or severe)    - MILD (1-3): doesn't interfere with normal activities, abdomen soft and not tender to touch     - MODERATE (4-7): interferes with normal activities or awakens from sleep, tender to touch     - SEVERE (8-10): excruciating pain, doubled over, unable to do any normal activities       It  varies 7. RECURRENT SYMPTOM: "Have you ever had this type of abdominal pain before?" If so, ask:  "When was the last time?" and "What happened that time?"      Yno 8. AGGRAVATING FACTORS: "Does anything seem to cause this pain?" (e.g., foods, stress, alcohol)      Maybe stress  9. CARDIAC SYMPTOMS: "Do you have any of the following symptoms: chest pain, difficulty breathing, sweating, nausea?"     denies 10. OTHER SYMPTOMS: "Do you have any other symptoms?" (e.g., fever, vomiting, diarrhea)       denies 11. PREGNANCY: "Is there any chance you are pregnant?" "When was your last menstrual period?"       na  Protocols used: ABDOMINAL PAIN - UPPER-A-AH

## 2018-09-01 NOTE — Telephone Encounter (Signed)
    [place] Female, 73 y.o., 04-07-1946 MRN:  [id number] Phone:  [phone] Briana Schroeder) PCP:  Biagio Borg, MD Primary Cvg:  [facility] PART A AND B Next Appt With Internal Medicine 09/05/2018 at 9:00 AM Message from Luciana Axe sent at 09/01/2018 12:38 PM EST   Patient is calling regarding stomach problems. She is wanting some advice should she take gas-x? Please advise. Thank you  Call History    Type Contact  09/01/2018 12:38 PM Phone (Incoming) Briana Schroeder, Briana Schroeder (Self)  Phone: [phone] Briana Schroeder)  User: Luciana Axe

## 2018-09-05 ENCOUNTER — Other Ambulatory Visit (INDEPENDENT_AMBULATORY_CARE_PROVIDER_SITE_OTHER): Payer: Medicare Other

## 2018-09-05 ENCOUNTER — Ambulatory Visit (INDEPENDENT_AMBULATORY_CARE_PROVIDER_SITE_OTHER): Payer: Medicare Other | Admitting: Internal Medicine

## 2018-09-05 ENCOUNTER — Encounter: Payer: Self-pay | Admitting: Internal Medicine

## 2018-09-05 VITALS — BP 116/62 | HR 80 | Temp 97.9°F | Ht 63.0 in | Wt 128.0 lb

## 2018-09-05 DIAGNOSIS — R634 Abnormal weight loss: Secondary | ICD-10-CM | POA: Diagnosis not present

## 2018-09-05 DIAGNOSIS — R1013 Epigastric pain: Secondary | ICD-10-CM | POA: Diagnosis not present

## 2018-09-05 DIAGNOSIS — R739 Hyperglycemia, unspecified: Secondary | ICD-10-CM | POA: Diagnosis not present

## 2018-09-05 DIAGNOSIS — F411 Generalized anxiety disorder: Secondary | ICD-10-CM | POA: Diagnosis not present

## 2018-09-05 LAB — URINALYSIS, ROUTINE W REFLEX MICROSCOPIC
Bilirubin Urine: NEGATIVE
HGB URINE DIPSTICK: NEGATIVE
Ketones, ur: NEGATIVE
Nitrite: POSITIVE — AB
Specific Gravity, Urine: 1.025 (ref 1.000–1.030)
Total Protein, Urine: NEGATIVE
Urine Glucose: NEGATIVE
Urobilinogen, UA: 0.2 (ref 0.0–1.0)
pH: 6 (ref 5.0–8.0)

## 2018-09-05 LAB — CBC WITH DIFFERENTIAL/PLATELET
Basophils Absolute: 0.1 10*3/uL (ref 0.0–0.1)
Basophils Relative: 1.2 % (ref 0.0–3.0)
Eosinophils Absolute: 0.1 10*3/uL (ref 0.0–0.7)
Eosinophils Relative: 1.9 % (ref 0.0–5.0)
HCT: 39.1 % (ref 36.0–46.0)
Hemoglobin: 13.3 g/dL (ref 12.0–15.0)
Lymphocytes Relative: 31.9 % (ref 12.0–46.0)
Lymphs Abs: 1.4 10*3/uL (ref 0.7–4.0)
MCHC: 34.1 g/dL (ref 30.0–36.0)
MCV: 91.1 fl (ref 78.0–100.0)
Monocytes Absolute: 0.4 10*3/uL (ref 0.1–1.0)
Monocytes Relative: 8.6 % (ref 3.0–12.0)
Neutro Abs: 2.5 10*3/uL (ref 1.4–7.7)
Neutrophils Relative %: 56.4 % (ref 43.0–77.0)
PLATELETS: 251 10*3/uL (ref 150.0–400.0)
RBC: 4.29 Mil/uL (ref 3.87–5.11)
RDW: 14.8 % (ref 11.5–15.5)
WBC: 4.5 10*3/uL (ref 4.0–10.5)

## 2018-09-05 LAB — BASIC METABOLIC PANEL
BUN: 26 mg/dL — ABNORMAL HIGH (ref 6–23)
CO2: 30 mEq/L (ref 19–32)
Calcium: 9.9 mg/dL (ref 8.4–10.5)
Chloride: 101 mEq/L (ref 96–112)
Creatinine, Ser: 0.8 mg/dL (ref 0.40–1.20)
GFR: 84.86 mL/min (ref >60.00)
Glucose, Bld: 104 mg/dL — ABNORMAL HIGH (ref 70–99)
Potassium: 4 mEq/L (ref 3.5–5.1)
Sodium: 139 mEq/L (ref 135–145)

## 2018-09-05 LAB — LIPASE: Lipase: 34 U/L (ref 11.0–59.0)

## 2018-09-05 LAB — HEPATIC FUNCTION PANEL
ALT: 38 U/L — AB (ref 0–35)
AST: 41 U/L — AB (ref 0–37)
Albumin: 4.4 g/dL (ref 3.5–5.2)
Alkaline Phosphatase: 21 U/L — ABNORMAL LOW (ref 39–117)
Bilirubin, Direct: 0.1 mg/dL (ref 0.0–0.3)
Total Bilirubin: 0.4 mg/dL (ref 0.2–1.2)
Total Protein: 7.1 g/dL (ref 6.0–8.3)

## 2018-09-05 LAB — H. PYLORI ANTIBODY, IGG: H Pylori IgG: NEGATIVE

## 2018-09-05 LAB — HEMOGLOBIN A1C: Hgb A1c MFr Bld: 6.2 % (ref 4.6–6.5)

## 2018-09-05 MED ORDER — PANTOPRAZOLE SODIUM 40 MG PO TBEC: 40.0000 mg | DELAYED_RELEASE_TABLET | Freq: Every day | ORAL | 1 refills | Status: DC

## 2018-09-05 NOTE — Assessment & Plan Note (Signed)
?   Psychiatric vs other, for labs as above

## 2018-09-05 NOTE — Assessment & Plan Note (Signed)
Etiology unclear, for labs as ordered, trial PPI, and in light of wt loss, will need GI referral for possible EGD

## 2018-09-05 NOTE — Assessment & Plan Note (Signed)
stable overall by history and exam, recent data reviewed with pt, and pt to continue medical treatment as before,  to f/u any worsening symptoms or concerns, for a1c with labs 

## 2018-09-05 NOTE — Progress Notes (Deleted)
Subjective:    Patient ID: Briana Schroeder, female    DOB: 20-Aug-1945, 73 y.o.   MRN: [id number]  HPI  Here with epigastric pain x 2 wks, with stomach rumbling, intermittent but more on than off, mild but tender the epigastric, dull, no radiation, had sone episode nausea but no vomiting and better with TUMS overall, no sour brash, no dysphagia, but has early satiety. No other abd pain  Has had some mild constipation intermittent for over a yr without change, last BM this AM normal per pt, no blood, no black stools, though may have been somewhat dark recently.  Has lost some wt about 20 lbs, she thinks may be from stress related to daughter with bipolar.  No fever, No prior gastric issues.But does have "carmps all over" at night mostly to try to be sleeping, and only some feet cramping during the day.  Pt denies chest pain, increased sob or doe, wheezing, orthopnea, PND, increased LE swelling, palpitations, dizziness or syncope.  Pt denies new neurological symptoms such as new headache, or facial or extremity weakness or numbness   Pt denies polydipsia, polyuria,  Wt Readings from Last 3 Encounters:  09/05/18 128 lb (58.1 kg)  06/17/18 135 lb (61.2 kg)  11/06/17 148 lb (67.1 kg)   Past Medical History:  Diagnosis Date  . ALLERGIC RHINITIS 08/02/2009  . Allergy   . ANXIETY 10/18/2008  . ARM PAIN, RIGHT 04/04/2009  . BACK PAIN 08/02/2009  . CHEST PAIN 12/05/2009  . COLONIC POLYPS, HX OF 10/18/2008  . DIZZINESS 12/05/2009  . EPIGASTRIC PAIN 01/09/2010  . FATIGUE 09/06/2009  . Fibroid   . GERD 12/06/2009  . HOARSENESS 09/06/2009  . HYPERLIPIDEMIA 10/18/2008  . HYPERTENSION 10/18/2008  . IBS 10/18/2008  . MYALGIA 03/21/2009  . Osteopenia    -1.06 Jul 2009  . PULMONARY SARCOIDOSIS 10/18/2008  . SINUSITIS- ACUTE-NOS 03/21/2009  . VOCAL CORD NODULE 09/06/2009   Past Surgical History:  Procedure Laterality Date  . ABDOMINAL HYSTERECTOMY  08/1992   TAH-DR G FOR FIBROIDS AND MENORRHAGIA  . COLONOSCOPY   11-24-2009   3 polyps, +TA and HPP  . hx of vocal cord nodules    . s/p right finger tendon surgury    . UPPER GASTROINTESTINAL ENDOSCOPY  01-08-2010   normal    reports that she has never smoked. She has never used smokeless tobacco. She reports that she does not drink alcohol or use drugs. family history includes Breast cancer in her mother; Diabetes in her mother; Hypertension in her mother and sister. Allergies  Allergen Reactions  . Sertraline Hcl Diarrhea   Current Outpatient Medications on File Prior to Visit  Medication Sig Dispense Refill  . amLODipine (NORVASC) 10 MG tablet Take 1 tablet (10 mg total) by mouth daily. 90 tablet 3  . gabapentin (NEURONTIN) 300 MG capsule Take 1 capsule (300 mg total) by mouth at bedtime. 90 capsule 1  . hydrochlorothiazide (HYDRODIURIL) 25 MG tablet Take 1 tablet (25 mg total) by mouth daily. 90 tablet 3  . Multiple Vitamin (MULTIVITAMIN) tablet Take 1 tablet by mouth daily.    . potassium chloride (K-DUR) 10 MEQ tablet Take 1 tablet (10 mEq total) by mouth daily. 90 tablet 3  . VITAMIN D, CHOLECALCIFEROL, PO Take 5,000 Units by mouth daily.     No current facility-administered medications on file prior to visit.    Review of Systems  Constitutional: Negative for other unusual diaphoresis or sweats HENT: Negative for ear discharge or  swelling Eyes: Negative for other worsening visual disturbances Respiratory: Negative for stridor or other swelling  Gastrointestinal: Negative for worsening distension or other blood Genitourinary: Negative for retention or other urinary change Musculoskeletal: Negative for other MSK pain or swelling Skin: Negative for color change or other new lesions Neurological: Negative for worsening tremors and other numbness  Psychiatric/Behavioral: Negative for worsening agitation or other fatigue All other system neg per pt    Objective:   Physical Exam BP 116/62   Pulse 80   Temp 97.9 F (36.6 C) (Oral)   Ht 5'  3" (1.6 m)   Wt 128 lb (58.1 kg)   SpO2 98%   BMI 22.67 kg/m  VS noted,  Constitutional: Pt appears in NAD HENT: Head: NCAT.  Right Ear: External ear normal.  Left Ear: External ear normal.  Eyes: . Pupils are equal, round, and reactive to light. Conjunctivae and EOM are normal Nose: without d/c or deformity Neck: Neck supple. Gross normal ROM Cardiovascular: Normal rate and regular rhythm.   Pulmonary/Chest: Effort normal and breath sounds without rales or wheezing.  Abd:  Soft, ND, + BS, no organomegaly, mild to mod epigastric tender only Neurological: Pt is alert. At baseline orientation, motor grossly intact Skin: Skin is warm. No rashes, other new lesions, no LE edema Psychiatric: Pt behavior is normal without agitation , mild nervous No other exam findings Lab Results  Component Value Date   WBC 4.8 06/17/2018   HGB 13.0 06/17/2018   HCT 38.4 06/17/2018   PLT 289.0 06/17/2018   GLUCOSE 100 (H) 06/17/2018   CHOL 202 (H) 06/17/2018   TRIG 61.0 06/17/2018   HDL 76.60 06/17/2018   LDLDIRECT 83.3 11/17/2012   LDLCALC 114 (H) 06/17/2018   ALT 35 06/17/2018   AST 42 (H) 06/17/2018   NA 139 06/17/2018   K 3.8 06/17/2018   CL 102 06/17/2018   CREATININE 0.73 06/17/2018   BUN 20 06/17/2018   CO2 29 06/17/2018   TSH 0.65 06/17/2018   HGBA1C 6.2 06/17/2018       Assessment & Plan:

## 2018-09-05 NOTE — Progress Notes (Signed)
Subjective:    Patient ID: Briana Schroeder, female    DOB: Sep 12, 1945, 73 y.o.   MRN: [id number]  HPI   Here with epigastric pain x 2 wks, with stomach rumbling, intermittent but more on than off, mild but tender the epigastric, dull, no radiation, had sone episode nausea but no vomiting and better with TUMS overall, no sour brash, no dysphagia, but has early satiety. No other abd pain  Has had some mild constipation intermittent for over a yr without change, last BM this AM normal per pt, no blood, no black stools, though may have been somewhat dark recently.  Has lost some wt about 20 lbs, she thinks may be from stress related to daughter with bipolar.  No fever, No prior gastric issues.But does have "carmps all over" at night mostly to try to be sleeping, and only some feet cramping during the day.  Pt denies chest pain, increased sob or doe, wheezing, orthopnea, PND, increased LE swelling, palpitations, dizziness or syncope.  Pt denies new neurological symptoms such as new headache, or facial or extremity weakness or numbness   Pt denies polydipsia, polyuria,  No significant nsaid or ETOH Past Medical History:  Diagnosis Date  . ALLERGIC RHINITIS 08/02/2009  . Allergy   . ANXIETY 10/18/2008  . ARM PAIN, RIGHT 04/04/2009  . BACK PAIN 08/02/2009  . CHEST PAIN 12/05/2009  . COLONIC POLYPS, HX OF 10/18/2008  . DIZZINESS 12/05/2009  . EPIGASTRIC PAIN 01/09/2010  . FATIGUE 09/06/2009  . Fibroid   . GERD 12/06/2009  . HOARSENESS 09/06/2009  . HYPERLIPIDEMIA 10/18/2008  . HYPERTENSION 10/18/2008  . IBS 10/18/2008  . MYALGIA 03/21/2009  . Osteopenia    -1.06 Jul 2009  . PULMONARY SARCOIDOSIS 10/18/2008  . SINUSITIS- ACUTE-NOS 03/21/2009  . VOCAL CORD NODULE 09/06/2009   Past Surgical History:  Procedure Laterality Date  . ABDOMINAL HYSTERECTOMY  08/1992   TAH-DR G FOR FIBROIDS AND MENORRHAGIA  . COLONOSCOPY  11-24-2009   3 polyps, +TA and HPP  . hx of vocal cord nodules    . s/p right finger tendon surgury     . UPPER GASTROINTESTINAL ENDOSCOPY  01-08-2010   normal    reports that she has never smoked. She has never used smokeless tobacco. She reports that she does not drink alcohol or use drugs. family history includes Breast cancer in her mother; Diabetes in her mother; Hypertension in her mother and sister. Allergies  Allergen Reactions  . Sertraline Hcl Diarrhea   Current Outpatient Medications on File Prior to Visit  Medication Sig Dispense Refill  . amLODipine (NORVASC) 10 MG tablet Take 1 tablet (10 mg total) by mouth daily. 90 tablet 3  . gabapentin (NEURONTIN) 300 MG capsule Take 1 capsule (300 mg total) by mouth at bedtime. 90 capsule 1  . hydrochlorothiazide (HYDRODIURIL) 25 MG tablet Take 1 tablet (25 mg total) by mouth daily. 90 tablet 3  . Multiple Vitamin (MULTIVITAMIN) tablet Take 1 tablet by mouth daily.    . potassium chloride (K-DUR) 10 MEQ tablet Take 1 tablet (10 mEq total) by mouth daily. 90 tablet 3  . VITAMIN D, CHOLECALCIFEROL, PO Take 5,000 Units by mouth daily.     No current facility-administered medications on file prior to visit.    Review of Systems  Constitutional: Negative for other unusual diaphoresis or sweats HENT: Negative for ear discharge or swelling Eyes: Negative for other worsening visual disturbances Respiratory: Negative for stridor or other swelling  Gastrointestinal: Negative for worsening  distension or other blood Genitourinary: Negative for retention or other urinary change Musculoskeletal: Negative for other MSK pain or swelling Skin: Negative for color change or other new lesions Neurological: Negative for worsening tremors and other numbness  Psychiatric/Behavioral: Negative for worsening agitation or other fatigue All other system neg per pt    Objective:   Physical Exam BP 116/62   Pulse 80   Temp 97.9 F (36.6 C) (Oral)   Ht 5\' 3"  (1.6 m)   Wt 128 lb (58.1 kg)   SpO2 98%   BMI 22.67 kg/m  VS noted,  Constitutional: Pt  appears in NAD HENT: Head: NCAT.  Right Ear: External ear normal.  Left Ear: External ear normal.  Eyes: . Pupils are equal, round, and reactive to light. Conjunctivae and EOM are normal Nose: without d/c or deformity Neck: Neck supple. Gross normal ROM Cardiovascular: Normal rate and regular rhythm.   Pulmonary/Chest: Effort normal and breath sounds without rales or wheezing.  Abd:  Soft,  ND, + BS, no organomegaly with mild to mod epigastric tender Neurological: Pt is alert. At baseline orientation, motor grossly intact Skin: Skin is warm. No rashes, other new lesions, no LE edema Psychiatric: Pt behavior is normal without agitation  No other exam findings Lab Results  Component Value Date   WBC 4.8 06/17/2018   HGB 13.0 06/17/2018   HCT 38.4 06/17/2018   PLT 289.0 06/17/2018   GLUCOSE 100 (H) 06/17/2018   CHOL 202 (H) 06/17/2018   TRIG 61.0 06/17/2018   HDL 76.60 06/17/2018   LDLDIRECT 83.3 11/17/2012   LDLCALC 114 (H) 06/17/2018   ALT 35 06/17/2018   AST 42 (H) 06/17/2018   NA 139 06/17/2018   K 3.8 06/17/2018   CL 102 06/17/2018   CREATININE 0.73 06/17/2018   BUN 20 06/17/2018   CO2 29 06/17/2018   TSH 0.65 06/17/2018   HGBA1C 6.2 06/17/2018  \    Assessment & Plan:

## 2018-09-05 NOTE — Patient Instructions (Signed)
Please take all new medication as prescribed - the protonix 40 mg per day  Please continue all other medications as before, and refills have been done if requested.  Please have the pharmacy call with any other refills you may need.  Please continue your efforts at being more active, low cholesterol diet, and weight control  Please keep your appointments with your specialists as you may have planned  You will be contacted regarding the referral for: Gastroenterology  Please go to the LAB in the Basement (turn left off the elevator) for the tests to be done today  You will be contacted by phone if any changes need to be made immediately.  Otherwise, you will receive a letter about your results with an explanation, but please check with MyChart first.  Please remember to sign up for MyChart if you have not done so, as this will be important to you in the future with finding out test results, communicating by private email, and scheduling acute appointments online when needed.

## 2018-09-05 NOTE — Assessment & Plan Note (Signed)
stable overall by history and exam, recent data reviewed with pt, and pt to continue medical treatment as before,  to f/u any worsening symptoms or concerns  

## 2018-09-11 ENCOUNTER — Ambulatory Visit: Payer: Medicare Other | Admitting: Gynecology

## 2018-09-18 ENCOUNTER — Other Ambulatory Visit: Payer: Self-pay

## 2018-09-19 ENCOUNTER — Encounter: Payer: Self-pay | Admitting: Gynecology

## 2018-09-19 ENCOUNTER — Ambulatory Visit (INDEPENDENT_AMBULATORY_CARE_PROVIDER_SITE_OTHER): Payer: Medicare Other | Admitting: Gynecology

## 2018-09-19 VITALS — BP 118/74 | Ht 61.0 in | Wt 132.0 lb

## 2018-09-19 DIAGNOSIS — N952 Postmenopausal atrophic vaginitis: Secondary | ICD-10-CM | POA: Diagnosis not present

## 2018-09-19 DIAGNOSIS — Z01419 Encounter for gynecological examination (general) (routine) without abnormal findings: Secondary | ICD-10-CM

## 2018-09-19 DIAGNOSIS — M8580 Other specified disorders of bone density and structure, unspecified site: Secondary | ICD-10-CM

## 2018-09-19 NOTE — Progress Notes (Signed)
    Briana Schroeder [birth year] [id number]        73 y.o.  G2P2 for breast and pelvic exam.  Former patient who has not been in for a number of years.  No GYN complaints.  Status post TAH in the past for leiomyoma.  Past medical history,surgical history, problem list, medications, allergies, family history and social history were all reviewed and documented as reviewed in the EPIC chart.  ROS:  Performed with pertinent positives and negatives included in the history, assessment and plan.   Additional significant findings : None   Exam: Caryn Bee assistant Vitals:   09/19/18 0917  BP: 118/74  Weight: 132 lb (59.9 kg)  Height: 5\' 1"  (1.549 m)   Body mass index is 24.94 kg/m.  General appearance:  Normal affect, orientation and appearance. Skin: Grossly normal HEENT: Without gross lesions.  No cervical or supraclavicular adenopathy. Thyroid normal.  Lungs:  Clear without wheezing, rales or rhonchi Cardiac: RR, without RMG Abdominal:  Soft, nontender, without masses, guarding, rebound, organomegaly or hernia Breasts:  Examined lying and sitting without masses, retractions, discharge or axillary adenopathy. Pelvic:  Ext, BUS, Vagina: With atrophic changes  Adnexa: Without masses or tenderness    Anus and perineum: Normal   Rectovaginal: Normal sphincter tone without palpated masses or tenderness.    Assessment/Plan:  73 y.o. G2P2 female for breast and pelvic exam  1. Postmenopausal status post TAH in the past for leiomyoma.  No menopausal symptoms. 2. Mammography 10/2016.  Reminded patient she needs to schedule and she agrees to do so.  Breast exam normal today. 3. Osteopenia.  DEXA 2018 T score -1.6 FRAX 4.9% / 0.8%.  Recommend follow-up DEXA this coming summer and she agrees to schedule and follow-up for this. 4. Colonoscopy 2016.  Repeat at their recommended interval. 5. Pap smear 2010.  No Pap smear done today.  No history of significant abnormal Pap smears.  We both agree  to stop screening per current screening guidelines. 6. Health maintenance.  No routine lab work done as patient does this elsewhere.  Follow-up 1 year, sooner as needed.   Anastasio Auerbach MD, 9:44 AM 09/19/2018

## 2018-09-19 NOTE — Patient Instructions (Signed)
Schedule in follow-up for your bone density.  Schedule your mammogram

## 2018-10-11 ENCOUNTER — Other Ambulatory Visit: Payer: Self-pay | Admitting: Internal Medicine

## 2018-11-10 NOTE — Progress Notes (Addendum)
Subjective:   DONA WALBY is a 73 y.o. female who presents for Medicare Annual (Subsequent) preventive examination.  I connected with patient 11/11/18 at  8:00 AM EDT by a video/audio enabled telemedicine application and verified that I am speaking with the correct person using two identifiers. Patient stated full name and DOB. Patient gave permission to continue with virtual visit. Patient's location was at home and Nurse's location was at [place] office.   Review of Systems:  No ROS.  Medicare Wellness Virtual Visit.  Visual/audio telehealth visit, UTA vital signs.   See social history for additional risk factors.  Cardiac Risk Factors include: advanced age (>102men, >59 women);hypertension;dyslipidemia Sleep patterns: no sleep issues, feels rested on waking, gets up 0-1 times nightly to void and sleeps 6-7 hours nightly.    Home Safety/Smoke Alarms: Feels safe in home. Smoke alarms in place.  Living environment; residence and Firearm Safety: 1-story house/ trailer. Lives with husband, no needs for DME, good support system Seat Belt Safety/Bike Helmet: Wears seat belt.       Objective:     Vitals: There were no vitals taken for this visit.  There is no height or weight on file to calculate BMI.  Advanced Directives 11/11/2018 11/06/2017 10/13/2016 09/04/2016 04/26/2015 04/14/2015 11/29/2014  Does Patient Have a Medical Advance Directive? No No No Yes No No No  Does patient want to make changes to medical advance directive? - - - Yes (ED - Information included in AVS) - - -  Would patient like information on creating a medical advance directive? Yes (ED - Information included in AVS) Yes (ED - Information included in AVS) No - Patient declined - No - patient declined information - No - patient declined information    Tobacco Social History   Tobacco Use  Smoking Status Never Smoker  Smokeless Tobacco Never Used     Counseling given: Not Answered  Past Medical History:   Diagnosis Date  . ALLERGIC RHINITIS 08/02/2009  . Allergy   . ANXIETY 10/18/2008  . ARM PAIN, RIGHT 04/04/2009  . BACK PAIN 08/02/2009  . CHEST PAIN 12/05/2009  . COLONIC POLYPS, HX OF 10/18/2008  . DIZZINESS 12/05/2009  . EPIGASTRIC PAIN 01/09/2010  . FATIGUE 09/06/2009  . GERD 12/06/2009  . HOARSENESS 09/06/2009  . HYPERLIPIDEMIA 10/18/2008  . HYPERTENSION 10/18/2008  . IBS 10/18/2008  . MYALGIA 03/21/2009  . Osteopenia    -1.06 Jul 2009  . PULMONARY SARCOIDOSIS 10/18/2008  . SINUSITIS- ACUTE-NOS 03/21/2009  . VOCAL CORD NODULE 09/06/2009   Past Surgical History:  Procedure Laterality Date  . ABDOMINAL HYSTERECTOMY  08/1992   TAH-DR G FOR FIBROIDS AND MENORRHAGIA  . COLONOSCOPY  11-24-2009   3 polyps, +TA and HPP  . hx of vocal cord nodules    . s/p right finger tendon surgury    . UPPER GASTROINTESTINAL ENDOSCOPY  01-08-2010   normal   Family History  Problem Relation Age of Onset  . Diabetes Mother   . Hypertension Mother   . Breast cancer Mother 74  . Hypertension Sister   . Colon cancer Neg Hx   . Colon polyps Neg Hx   . Rectal cancer Neg Hx   . Stomach cancer Neg Hx    Social History   Socioeconomic History  . Marital status: Married    Spouse name: Not on file  . Number of children: 2  . Years of education: Not on file  . Highest education level: Not on file  Occupational  History  . Occupation: Educational psychologist  Social Needs  . Financial resource strain: Not hard at all  . Food insecurity:    Worry: Never true    Inability: Never true  . Transportation needs:    Medical: No    Non-medical: No  Tobacco Use  . Smoking status: Never Smoker  . Smokeless tobacco: Never Used  Substance and Sexual Activity  . Alcohol use: No    Alcohol/week: 0.0 standard drinks  . Drug use: No  . Sexual activity: Not Currently    Birth control/protection: Surgical    Comment: 1st intercourse 19 yo-1 partner  Lifestyle  . Physical activity:    Days per week: 5 days    Minutes  per session: 70 min  . Stress: Not at all  Relationships  . Social connections:    Talks on phone: More than three times a week    Gets together: More than three times a week    Attends religious service: More than 4 times per year    Active member of club or organization: Yes    Attends meetings of clubs or organizations: 1 to 4 times per year    Relationship status: Married  Other Topics Concern  . Not on file  Social History Narrative  . Not on file    Outpatient Encounter Medications as of 11/11/2018  Medication Sig  . amLODipine (NORVASC) 10 MG tablet Take 1 tablet (10 mg total) by mouth daily.  Marland Kitchen gabapentin (NEURONTIN) 300 MG capsule Take 1 capsule (300 mg total) by mouth at bedtime.  . hydrochlorothiazide (HYDRODIURIL) 25 MG tablet Take 1 tablet (25 mg total) by mouth daily.  . Multiple Vitamin (MULTIVITAMIN) tablet Take 1 tablet by mouth daily.  . pantoprazole (PROTONIX) 40 MG tablet Take 1 tablet by mouth once daily  . potassium chloride (K-DUR) 10 MEQ tablet Take 1 tablet (10 mEq total) by mouth daily.  Marland Kitchen VITAMIN D, CHOLECALCIFEROL, PO Take 5,000 Units by mouth daily.   No facility-administered encounter medications on file as of 11/11/2018.     Activities of Daily Living In your present state of health, do you have any difficulty performing the following activities: 11/11/2018  Hearing? N  Vision? N  Difficulty concentrating or making decisions? N  Walking or climbing stairs? N  Dressing or bathing? N  Doing errands, shopping? N  Preparing Food and eating ? N  Using the Toilet? N  In the past six months, have you accidently leaked urine? N  Do you have problems with loss of bowel control? N  Managing your Medications? N  Managing your Finances? N  Housekeeping or managing your Housekeeping? N  Some recent data might be hidden    Patient Care Team: Biagio Borg, MD as PCP - General Fontaine, Belinda Block, MD as Consulting Physician (Gynecology) Ladene Artist,  MD as Consulting Physician (Gastroenterology) Paulla Dolly Tamala Fothergill, DPM as Consulting Physician (Podiatry)    Assessment:   This is a routine wellness examination for Daija. Physical assessment deferred to PCP.  Exercise Activities and Dietary recommendations Current Exercise Habits: Home exercise routine, Type of exercise: walking, Time (Minutes): 60, Frequency (Times/Week): 6, Weekly Exercise (Minutes/Week): 360, Intensity: Mild, Exercise limited by: Other - see comments(feet pain at times)  Diet (meal preparation, eat out, water intake, caffeinated beverages, dairy products, fruits and vegetables): in general, a "healthy" diet  , well balanced   Reviewed heart healthy and diabetic diet. Encouraged patient to increase daily water  and healthy fluid intake.      Goals    . Increase water intake     Will try to drink 3 bottles of water per day.    . 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        Fall Risk Fall Risk  11/11/2018 09/05/2018 06/17/2018 11/06/2017 05/15/2017  Falls in the past year? 1 0 0 No No  Number falls in past yr: 0 - - - -  Injury with Fall? 0 - - - -   IDepression Screen PHQ 2/9 Scores 11/11/2018 09/05/2018 06/17/2018 11/06/2017  PHQ - 2 Score 0 1 1 0  PHQ- 9 Score - - - 0     Cognitive Function       Ad8 score reviewed for issues:  Issues making decisions: no  Less interest in hobbies / activities: no  Repeats questions, stories (family complaining): no  Trouble using ordinary gadgets (microwave, computer, phone):no  Forgets the month or year: no  Mismanaging finances: no  Remembering appts: no  Daily problems with thinking and/or memory: no Ad8 score is= 0  Immunization History  Administered Date(s) Administered  . Pneumococcal Conjugate-13 05/21/2013  . Pneumococcal Polysaccharide-23 09/06/2009, 11/06/2017  . Td 10/18/2008  . Zoster  10/18/2008   Screening Tests Health Maintenance  Topic Date Due  . TETANUS/TDAP  10/19/2018  . MAMMOGRAM  11/28/2018  . COLONOSCOPY  04/25/2020  . DEXA SCAN  Completed  . Hepatitis C Screening  Completed  . PNA vac Low Risk Adult  Completed     Plan:     I have personally reviewed and noted the following in the patient's chart:   . Medical and social history . Use of alcohol, tobacco or illicit drugs  . Current medications and supplements . Functional ability and status . Nutritional status . Physical activity . Advanced directives . List of other physicians . Screenings to include cognitive, depression, and falls . Referrals and appointments  In addition, I have reviewed and discussed with patient certain preventive protocols, quality metrics, and best practice recommendations. A written personalized care plan for preventive services as well as general preventive health recommendations were provided to patient.     Michiel Cowboy, RN  11/11/2018    Medical screening examination/treatment/procedure(s) were performed by non-physician practitioner and as supervising physician I was immediately available for consultation/collaboration. I agree with above. Cathlean Cower, MD

## 2018-11-11 ENCOUNTER — Ambulatory Visit (INDEPENDENT_AMBULATORY_CARE_PROVIDER_SITE_OTHER): Payer: Medicare Other | Admitting: *Deleted

## 2018-11-11 DIAGNOSIS — Z0000 Encounter for general adult medical examination without abnormal findings: Secondary | ICD-10-CM

## 2018-11-11 NOTE — Patient Instructions (Signed)
Continue doing brain stimulating activities (puzzles, reading, adult coloring books, staying active) to keep memory sharp.   Continue to eat heart healthy diet (full of fruits, vegetables, whole grains, lean protein, water--limit salt, fat, and sugar intake) and increase physical activity as tolerated.   Briana Schroeder , Thank you for taking time to come for your Medicare Wellness Visit. I appreciate your ongoing commitment to your health goals. Please review the following plan we discussed and let me know if I can assist you in the future.   These are the goals we discussed: Goals     Increase water intake     Will try to increase my daily intake of water and continue to eat healthy and stay physically active.      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        This is a list of the screening recommended for you and due dates:  Health Maintenance  Topic Date Due   Tetanus Vaccine  10/19/2018   Mammogram  11/28/2018   Colon Cancer Screening  04/25/2020   DEXA scan (bone density measurement)  Completed    Hepatitis C: One time screening is recommended by Center for Disease Control  (CDC) for  adults born from [birth year] through 1966.   Completed   Pneumonia vaccines  Completed    Prediabetes Eating Plan Prediabetes is a condition that causes blood sugar (glucose) levels to be higher than normal. This increases the risk for developing diabetes. In order to prevent diabetes from developing, your health care provider may recommend a diet and other lifestyle changes to help you:  Control your blood glucose levels.  Improve your cholesterol levels.  Manage your blood pressure. Your health care provider may recommend working with a diet and nutrition specialist (dietitian) to make a meal plan that is best for you. What are tips for following this plan? Lifestyle  Set  weight loss goals with the help of your health care team. It is recommended that most people with prediabetes lose 7% of their current body weight.  Exercise for at least 30 minutes at least 5 days a week.  Attend a support group or seek ongoing support from a mental health counselor.  Take over-the-counter and prescription medicines only as told by your health care provider. Reading food labels  Read food labels to check the amount of fat, salt (sodium), and sugar in prepackaged foods. Avoid foods that have: ? Saturated fats. ? Trans fats. ? Added sugars.  Avoid foods that have more than 300 milligrams (mg) of sodium per serving. Limit your daily sodium intake to less than 2,300 mg each day. Shopping  Avoid buying pre-made and processed foods. Cooking  Cook with olive oil. Do not use butter, lard, or ghee.  Bake, broil, grill, or boil foods. Avoid frying. Meal planning   Work with your dietitian to develop an eating plan that is right for you. This may include: ? Tracking how many calories you take in. Use a food diary, notebook, or mobile application to track what you eat at each meal. ? Using the glycemic index (GI) to plan your meals. The index tells you how quickly a food will raise your blood glucose. Choose low-GI foods. These foods take a longer time to raise blood glucose.  Consider following a Mediterranean diet. This diet includes: ? Several  servings each day of fresh fruits and vegetables. ? Eating fish at least twice a week. ? Several servings each day of whole grains, beans, nuts, and seeds. ? Using olive oil instead of other fats. ? Moderate alcohol consumption. ? Eating small amounts of red meat and whole-fat dairy.  If you have high blood pressure, you may need to limit your sodium intake or follow a diet such as the DASH eating plan. DASH is an eating plan that aims to lower high blood pressure. What foods are recommended? The items listed below may not be a  complete list. Talk with your dietitian about what dietary choices are best for you. Grains Whole grains, such as whole-wheat or whole-grain breads, crackers, cereals, and pasta. Unsweetened oatmeal. Bulgur. Barley. Quinoa. Brown rice. Corn or whole-wheat flour tortillas or taco shells. Vegetables Lettuce. Spinach. Peas. Beets. Cauliflower. Cabbage. Broccoli. Carrots. Tomatoes. Squash. Eggplant. Herbs. Peppers. Onions. Cucumbers. Brussels sprouts. Fruits Berries. Bananas. Apples. Oranges. Grapes. Papaya. Mango. Pomegranate. Kiwi. Grapefruit. Cherries. Meats and other protein foods Seafood. Poultry without skin. Lean cuts of pork and beef. Tofu. Eggs. Nuts. Beans. Dairy Low-fat or fat-free dairy products, such as yogurt, cottage cheese, and cheese. Beverages Water. Tea. Coffee. Sugar-free or diet soda. Seltzer water. Lowfat or no-fat milk. Milk alternatives, such as soy or almond milk. Fats and oils Olive oil. Canola oil. Sunflower oil. Grapeseed oil. Avocado. Walnuts. Sweets and desserts Sugar-free or low-fat pudding. Sugar-free or low-fat ice cream and other frozen treats. Seasoning and other foods Herbs. Sodium-free spices. Mustard. Relish. Low-fat, low-sugar ketchup. Low-fat, low-sugar barbecue sauce. Low-fat or fat-free mayonnaise. What foods are not recommended? The items listed below may not be a complete list. Talk with your dietitian about what dietary choices are best for you. Grains Refined white flour and flour products, such as bread, pasta, snack foods, and cereals. Vegetables Canned vegetables. Frozen vegetables with butter or cream sauce. Fruits Fruits canned with syrup. Meats and other protein foods Fatty cuts of meat. Poultry with skin. Breaded or fried meat. Processed meats. Dairy Full-fat yogurt, cheese, or milk. Beverages Sweetened drinks, such as sweet iced tea and soda. Fats and oils Butter. Lard. Ghee. Sweets and desserts Baked goods, such as cake, cupcakes,  pastries, cookies, and cheesecake. Seasoning and other foods Spice mixes with added salt. Ketchup. Barbecue sauce. Mayonnaise. Summary  To prevent diabetes from developing, you may need to make diet and other lifestyle changes to help control blood sugar, improve cholesterol levels, and manage your blood pressure.  Set weight loss goals with the help of your health care team. It is recommended that most people with prediabetes lose 7 percent of their current body weight.  Consider following a Mediterranean diet that includes plenty of fresh fruits and vegetables, whole grains, beans, nuts, seeds, fish, lean meat, low-fat dairy, and healthy oils. This information is not intended to replace advice given to you by your health care provider. Make sure you discuss any questions you have with your health care provider. Document Released: 11/02/2014 Document Revised: 08/22/2016 Document Reviewed: 08/22/2016 Elsevier Interactive Patient Education  2019 [place] 65 Years and Older, Female Preventive care refers to lifestyle choices and visits with your health care provider that can promote health and wellness. What does preventive care include?  A yearly physical exam. This is also called an annual well check.  Dental exams once or twice a year.  Routine eye exams. Ask your health care provider how often you should have your eyes  checked.  Personal lifestyle choices, including: ? Daily care of your teeth and gums. ? Regular physical activity. ? Eating a healthy diet. ? Avoiding tobacco and drug use. ? Limiting alcohol use. ? Practicing safe sex. ? Taking low-dose aspirin every day. ? Taking vitamin and mineral supplements as recommended by your health care provider. What happens during an annual well check? The services and screenings done by your health care provider during your annual well check will depend on your age, overall health, lifestyle risk factors, and  family history of disease. Counseling Your health care provider may ask you questions about your:  Alcohol use.  Tobacco use.  Drug use.  Emotional well-being.  Home and relationship well-being.  Sexual activity.  Eating habits.  History of falls.  Memory and ability to understand (cognition).  Work and work Statistician.  Reproductive health.  Screening You may have the following tests or measurements:  Height, weight, and BMI.  Blood pressure.  Lipid and cholesterol levels. These may be checked every 5 years, or more frequently if you are over 43 years old.  Skin check.  Lung cancer screening. You may have this screening every year starting at age 59 if you have a 30-pack-year history of smoking and currently smoke or have quit within the past 15 years.  Colorectal cancer screening. All adults should have this screening starting at age 27 and continuing until age 81. You will have tests every 1-10 years, depending on your results and the type of screening test. People at increased risk should start screening at an earlier age. Screening tests may include: ? Guaiac-based fecal occult blood testing. ? Fecal immunochemical test (FIT). ? Stool DNA test. ? Virtual colonoscopy. ? Sigmoidoscopy. During this test, a flexible tube with a tiny camera (sigmoidoscope) is used to examine your rectum and lower colon. The sigmoidoscope is inserted through your anus into your rectum and lower colon. ? Colonoscopy. During this test, a long, thin, flexible tube with a tiny camera (colonoscope) is used to examine your entire colon and rectum.  Hepatitis C blood test.  Hepatitis B blood test.  Sexually transmitted disease (STD) testing.  Diabetes screening. This is done by checking your blood sugar (glucose) after you have not eaten for a while (fasting). You may have this done every 1-3 years.  Bone density scan. This is done to screen for osteoporosis. You may have this done  starting at age 35.  Mammogram. This may be done every 1-2 years. Talk to your health care provider about how often you should have regular mammograms. Talk with your health care provider about your test results, treatment options, and if necessary, the need for more tests. Vaccines Your health care provider may recommend certain vaccines, such as:  Influenza vaccine. This is recommended every year.  Tetanus, diphtheria, and acellular pertussis (Tdap, Td) vaccine. You may need a Td booster every 10 years.  Varicella vaccine. You may need this if you have not been vaccinated.  Zoster vaccine. You may need this after age 45.  Measles, mumps, and rubella (MMR) vaccine. You may need at least one dose of MMR if you were born in 1957 or later. You may also need a second dose.  Pneumococcal 13-valent conjugate (PCV13) vaccine. One dose is recommended after age 46.  Pneumococcal polysaccharide (PPSV23) vaccine. One dose is recommended after age 7.  Meningococcal vaccine. You may need this if you have certain conditions.  Hepatitis A vaccine. You may need this if you have  certain conditions or if you travel or work in places where you may be exposed to hepatitis A.  Hepatitis B vaccine. You may need this if you have certain conditions or if you travel or work in places where you may be exposed to hepatitis B.  Haemophilus influenzae type b (Hib) vaccine. You may need this if you have certain conditions. Talk to your health care provider about which screenings and vaccines you need and how often you need them. This information is not intended to replace advice given to you by your health care provider. Make sure you discuss any questions you have with your health care provider. Document Released: 07/15/2015 Document Revised: 08/08/2017 Document Reviewed: 04/19/2015 Elsevier Interactive Patient Education  2019 [employer].

## 2018-11-12 ENCOUNTER — Ambulatory Visit: Payer: Self-pay

## 2018-11-12 NOTE — Telephone Encounter (Signed)
Pt called to say that she has tingly feet which bother her mostly at night.  She states that she has been seen in the office for this problem and prescribed gabapentin.  She states she has not been taking it. She states that yesterday she noted some slight swelling to her left outer foot. She states that walking on them helps.  She rates the pain at 6. Home care advice read to patient. Patient verbalized understanding and states that she will start to take her gabapentin as prescribed.  She will call back if swelling issues continue.  Reason for Disposition . Leg pain  Answer Assessment - Initial Assessment Questions 1. ONSET: "When did the pain start?"      Tingling in feet with pain at last visit 2. LOCATION: "Where is the pain located?"     Rt and left feet 3. PAIN: "How bad is the pain?"    (Scale 1-10; or mild, moderate, severe)   -  MILD (1-3): doesn't interfere with normal activities    -  MODERATE (4-7): interferes with normal activities (e.g., work or school) or awakens from sleep, limping    -  SEVERE (8-10): excruciating pain, unable to do any normal activities, unable to walk    6 but gets better when walks 4. WORK OR EXERCISE: "Has there been any recent work or exercise that involved this part of the body?"     No 5. CAUSE: "What do you think is causing the leg pain?"    Sometimes in legs but mainly feet 6. OTHER SYMPTOMS: "Do you have any other symptoms?" (e.g., chest pain, back pain, breathing difficulty, swelling, rash, fever, numbness, weakness)   Swelling a little to left foot 7. PREGNANCY: "Is there any chance you are pregnant?" "When was your last menstrual period?"     N/A  Protocols used: LEG PAIN-A-AH

## 2018-12-01 DIAGNOSIS — M8580 Other specified disorders of bone density and structure, unspecified site: Secondary | ICD-10-CM

## 2018-12-01 HISTORY — DX: Other specified disorders of bone density and structure, unspecified site: M85.80

## 2018-12-15 ENCOUNTER — Other Ambulatory Visit: Payer: Self-pay

## 2018-12-16 ENCOUNTER — Ambulatory Visit (INDEPENDENT_AMBULATORY_CARE_PROVIDER_SITE_OTHER): Payer: Medicare Other

## 2018-12-16 ENCOUNTER — Other Ambulatory Visit: Payer: Self-pay | Admitting: Gynecology

## 2018-12-16 DIAGNOSIS — M8589 Other specified disorders of bone density and structure, multiple sites: Secondary | ICD-10-CM

## 2018-12-16 DIAGNOSIS — Z780 Asymptomatic menopausal state: Secondary | ICD-10-CM | POA: Diagnosis not present

## 2018-12-16 DIAGNOSIS — M8580 Other specified disorders of bone density and structure, unspecified site: Secondary | ICD-10-CM

## 2018-12-23 ENCOUNTER — Encounter: Payer: Self-pay | Admitting: Gynecology

## 2018-12-24 DIAGNOSIS — Z20828 Contact with and (suspected) exposure to other viral communicable diseases: Secondary | ICD-10-CM | POA: Diagnosis not present

## 2019-04-02 ENCOUNTER — Other Ambulatory Visit: Payer: Self-pay | Admitting: Internal Medicine

## 2019-04-09 ENCOUNTER — Encounter: Payer: Self-pay | Admitting: Gynecology

## 2019-05-31 ENCOUNTER — Other Ambulatory Visit: Payer: Self-pay | Admitting: Internal Medicine

## 2019-06-30 ENCOUNTER — Other Ambulatory Visit: Payer: Self-pay | Admitting: Internal Medicine

## 2019-07-24 ENCOUNTER — Other Ambulatory Visit: Payer: Self-pay | Admitting: Internal Medicine

## 2019-08-31 ENCOUNTER — Other Ambulatory Visit: Payer: Self-pay | Admitting: Internal Medicine

## 2019-08-31 IMAGING — DX DG HIP (WITH OR WITHOUT PELVIS) 3-4V BILAT
5 series · 5 of 5 positions shown · non-contrast
Comparison: CT 01/06/2010

CLINICAL DATA: Bilateral left greater than right hip pain

EXAM:
DG HIP (WITH OR WITHOUT PELVIS) 3-4V BILAT

[pelvis ap]
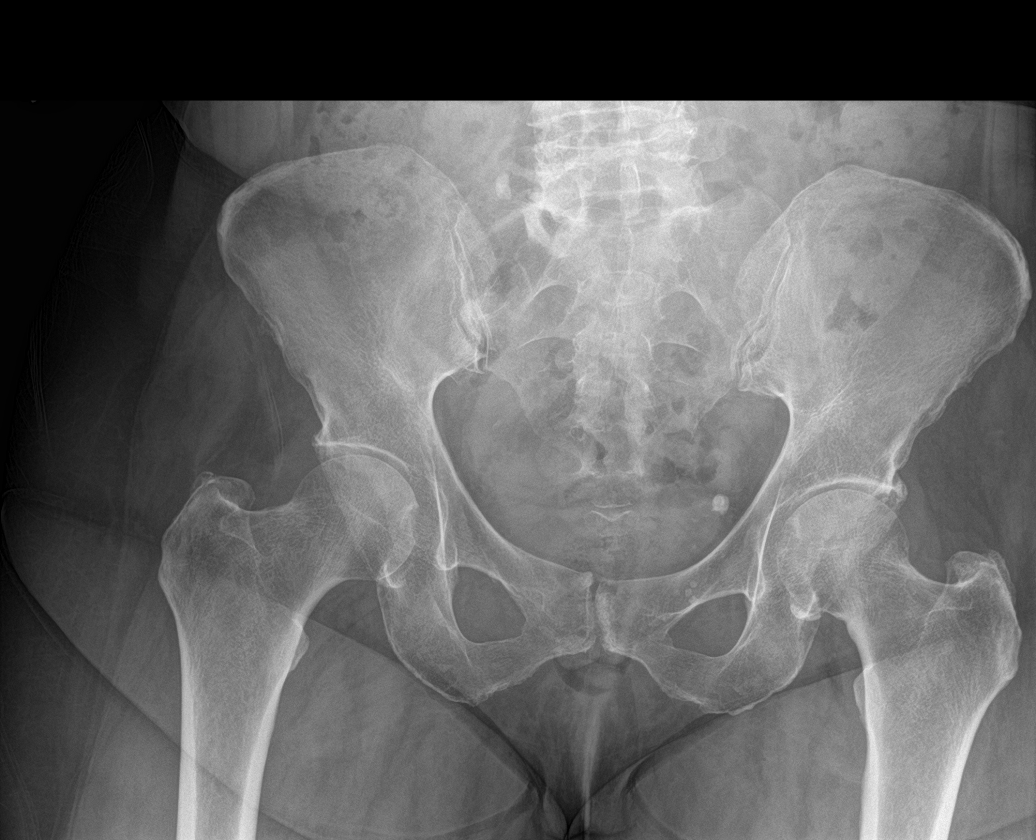

[hip ap (1 of 2)]
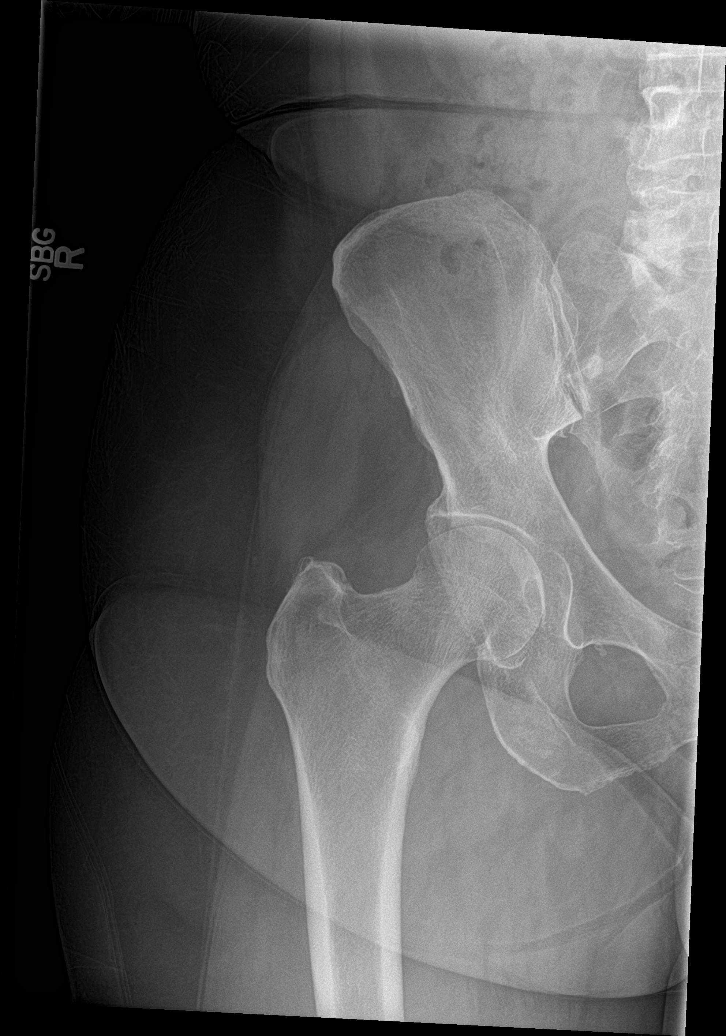

[hip lat (1 of 2)]
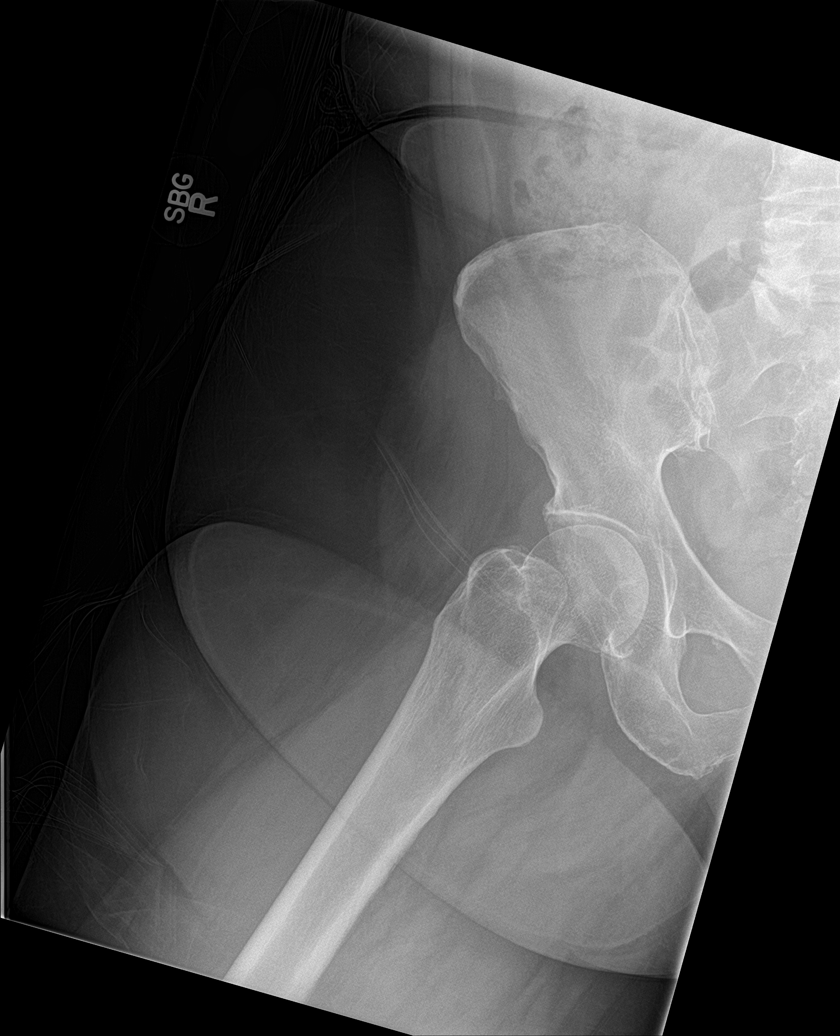

[hip ap (2 of 2)]
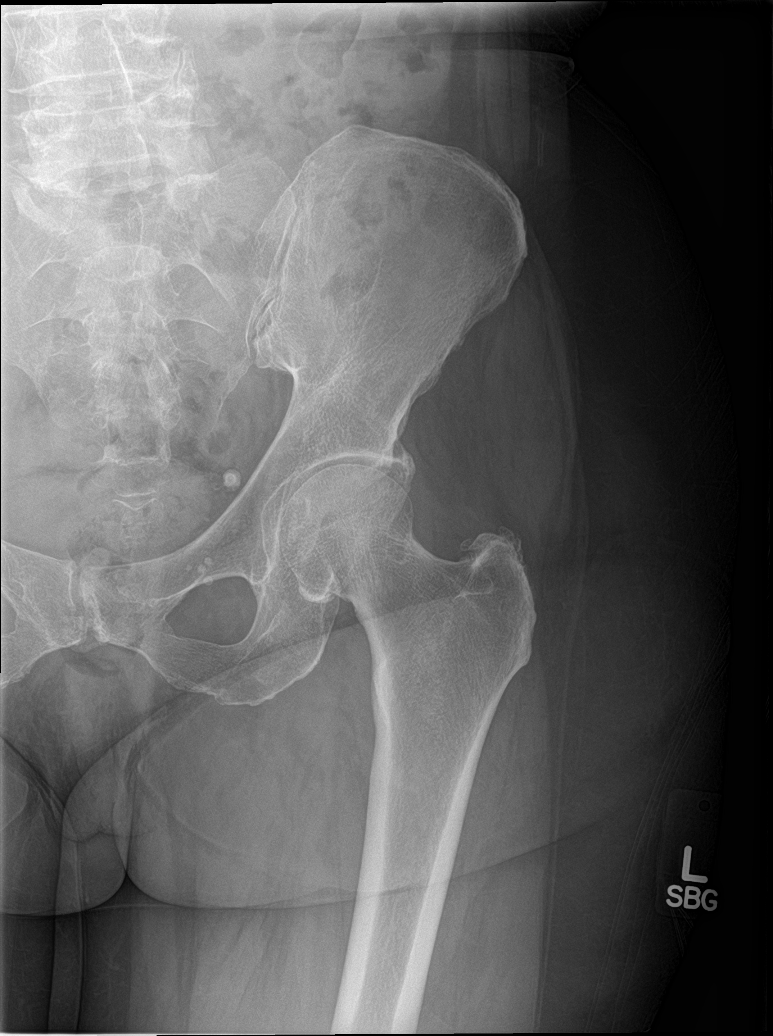

[hip lat (2 of 2)]
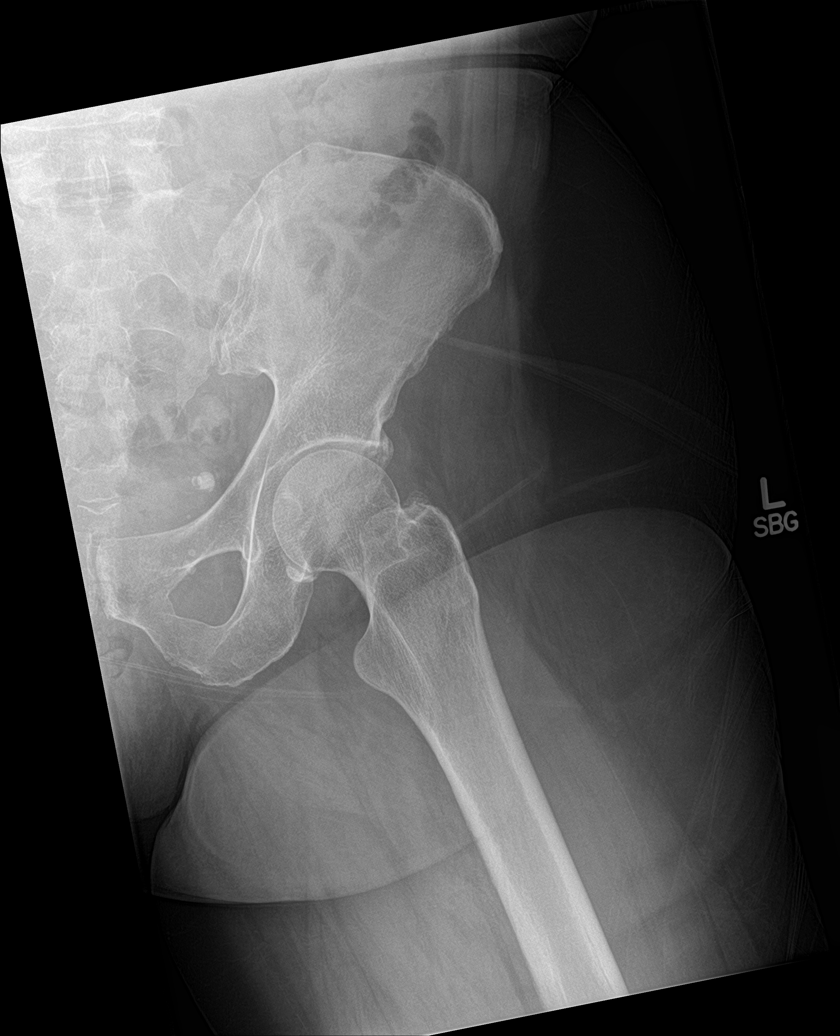

[5 of 5 positions shown; findings below may reference images not displayed]

FINDINGS: Mild SI joint disease. Pubic symphysis and rami are intact. No
fracture or malalignment. Mild left greater than right hip
arthritis.
IMPRESSION: 1. No acute osseous abnormality
2. Mild left greater than right hip arthritis

## 2019-08-31 IMAGING — DX DG SHOULDER 2+V*L*
3 series · 3 of 3 positions shown · non-contrast
Comparison: None.

CLINICAL DATA: Recurrent pain for 2 weeks

EXAM:
LEFT SHOULDER - 2+ VIEW

[grashey]
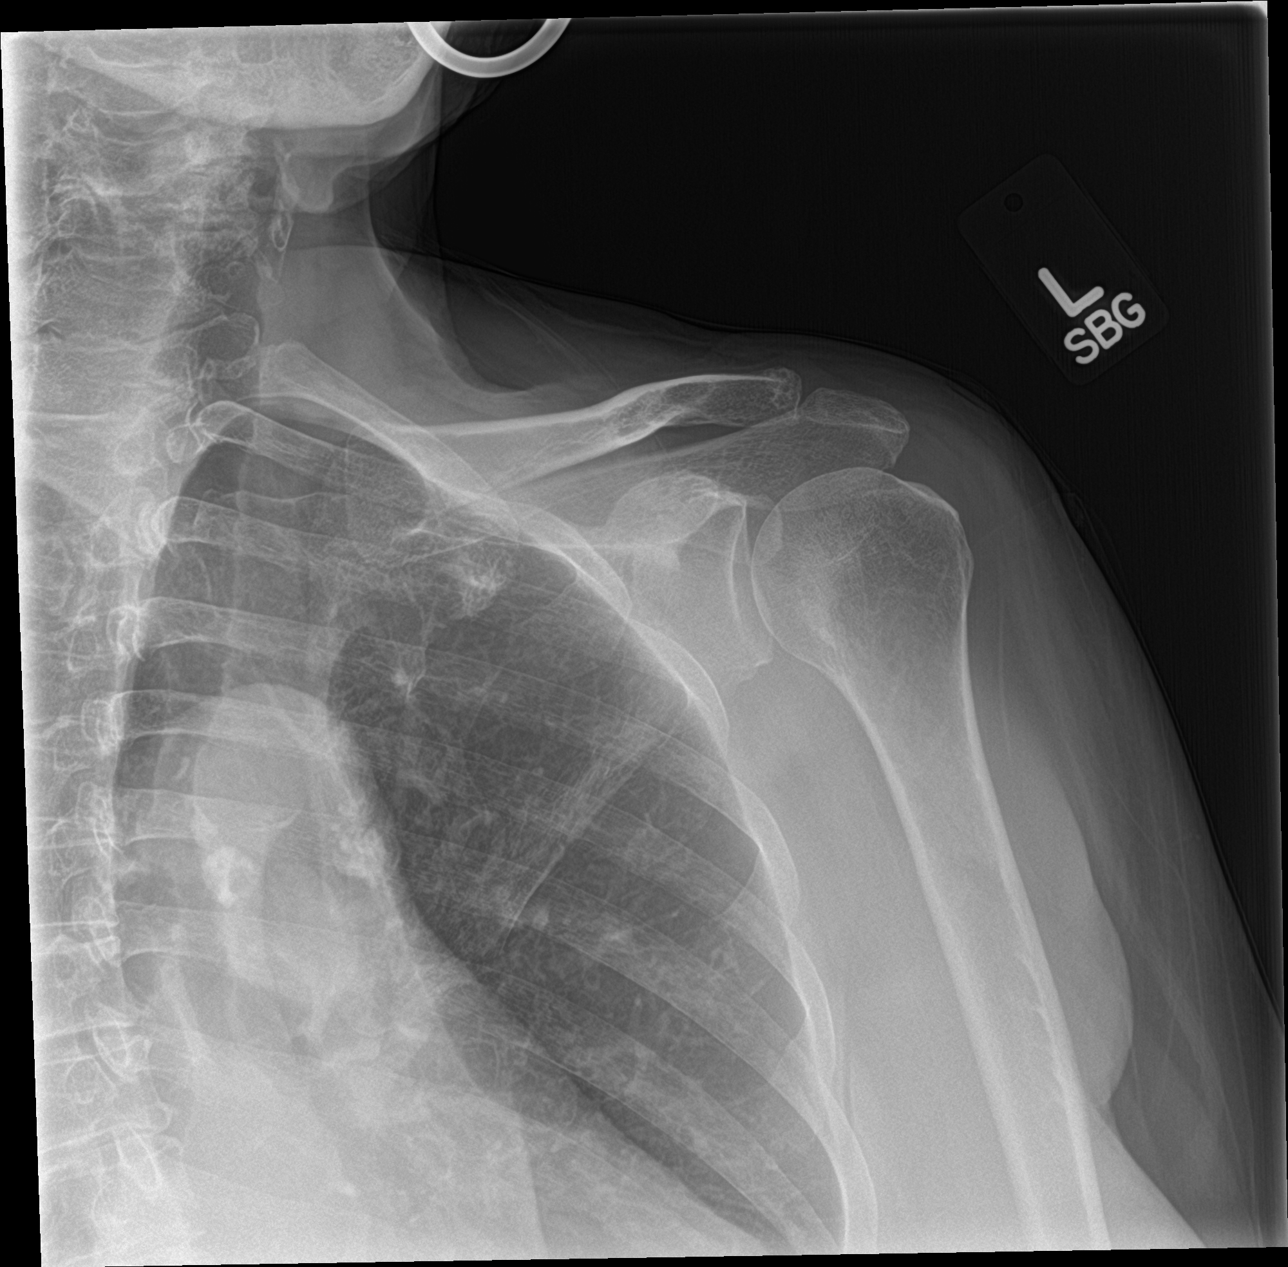

[y view]
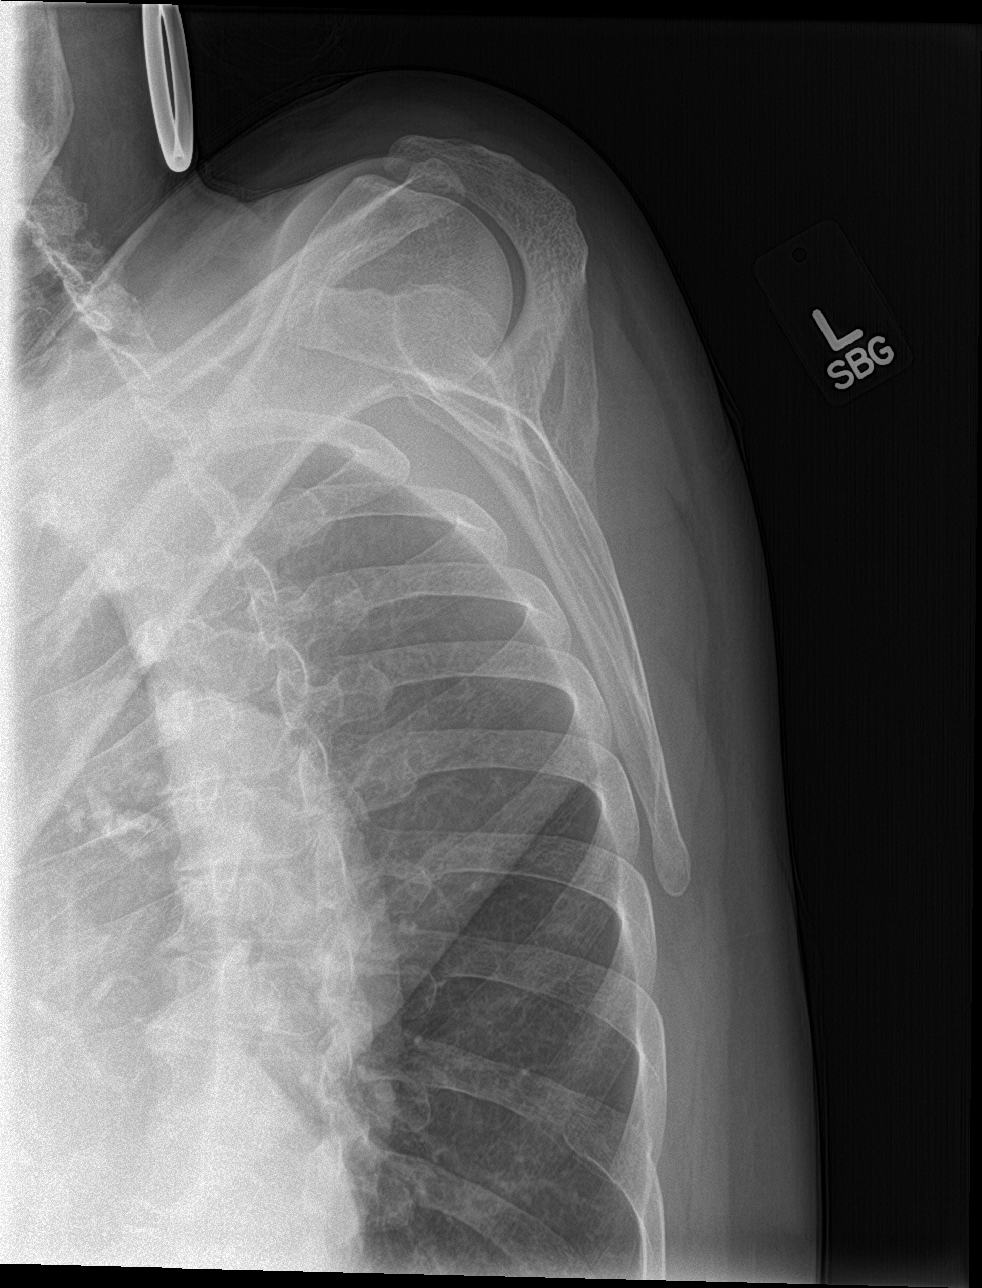

[shoulder axial]
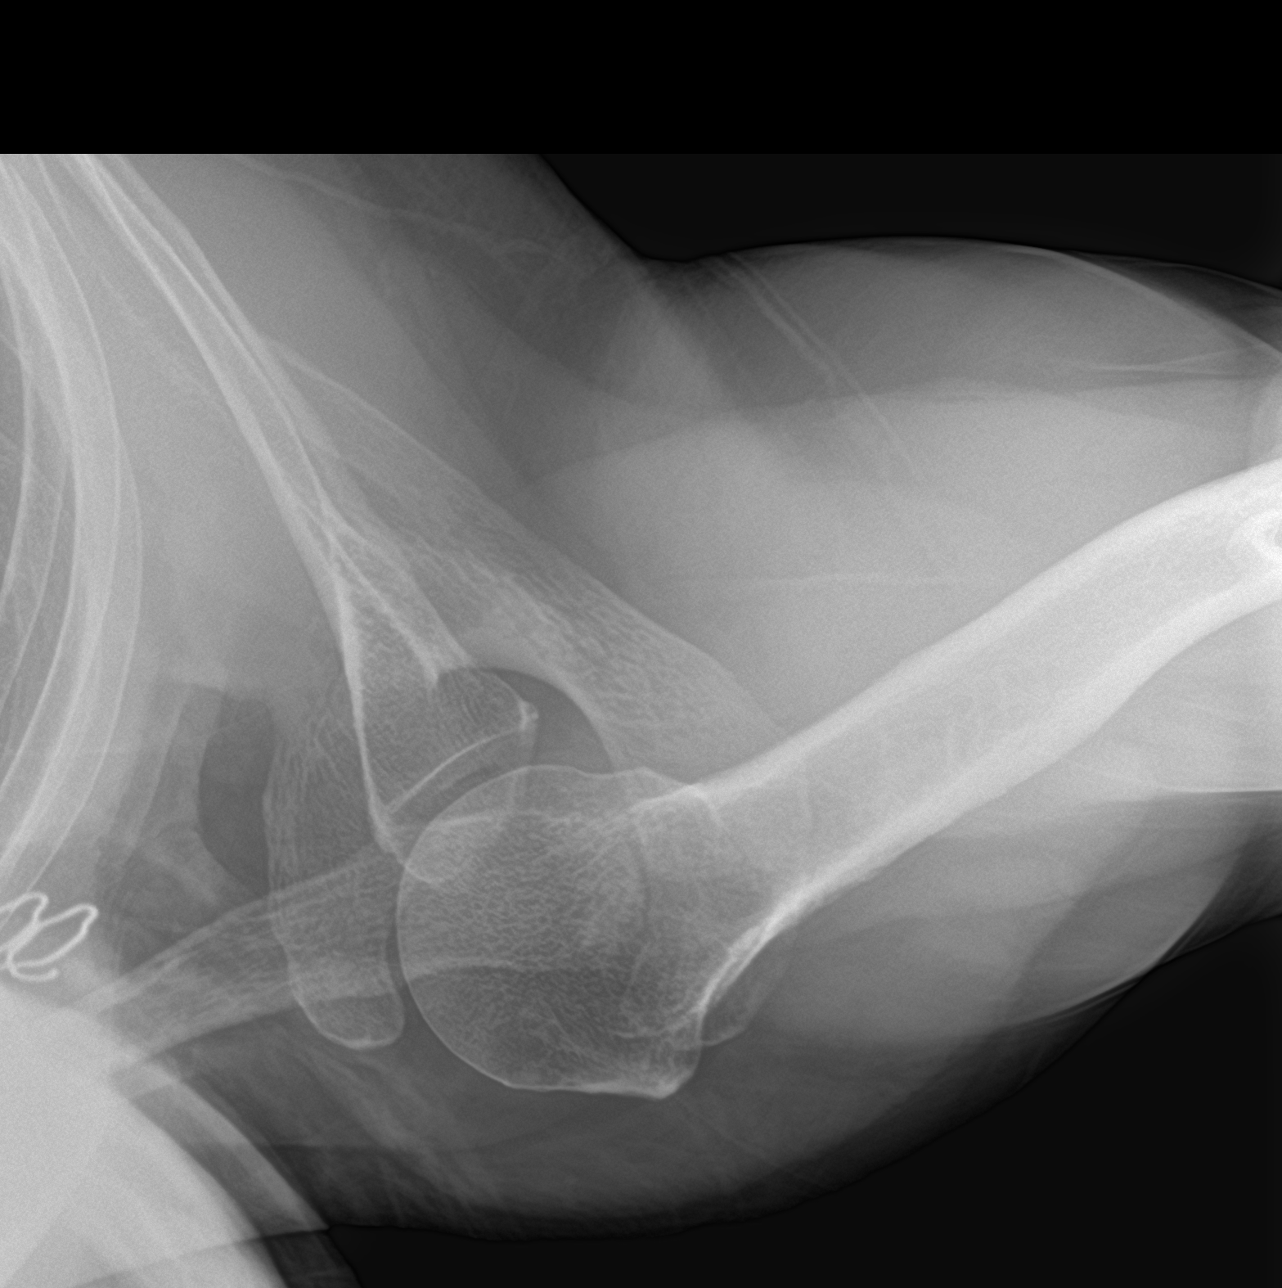

[3 of 3 positions shown; findings below may reference images not displayed]

FINDINGS: Frontal, Y scapular, and axillary images were obtained. There is no
fracture or dislocation. There is mild osteoarthritic change in the
acromioclavicular joint. Glenohumeral joint appears unremarkable. No
erosive change. There is aortic atherosclerosis. Visualized left
lung is clear.
IMPRESSION: No fracture or dislocation. Mild osteoarthritic change
acromioclavicular joint. There is aortic atherosclerosis.

Aortic Atherosclerosis ([id number].J).

## 2019-08-31 NOTE — Telephone Encounter (Signed)
Please refill as per office routine med refill policy (all routine meds refilled for 3 mo or monthly per pt preference up to one year from last visit, then month to month grace period for 3 mo, then further med refills will have to be denied)  

## 2019-09-24 ENCOUNTER — Other Ambulatory Visit: Payer: Self-pay | Admitting: Internal Medicine

## 2019-09-29 ENCOUNTER — Other Ambulatory Visit: Payer: Self-pay

## 2019-09-29 ENCOUNTER — Ambulatory Visit (INDEPENDENT_AMBULATORY_CARE_PROVIDER_SITE_OTHER): Payer: Medicare Other | Admitting: Obstetrics and Gynecology

## 2019-09-29 ENCOUNTER — Encounter: Payer: Self-pay | Admitting: Obstetrics and Gynecology

## 2019-09-29 VITALS — BP 124/82 | Ht 62.0 in | Wt 135.0 lb

## 2019-09-29 DIAGNOSIS — R1084 Generalized abdominal pain: Secondary | ICD-10-CM | POA: Diagnosis not present

## 2019-09-29 DIAGNOSIS — M8580 Other specified disorders of bone density and structure, unspecified site: Secondary | ICD-10-CM

## 2019-09-29 DIAGNOSIS — Z01419 Encounter for gynecological examination (general) (routine) without abnormal findings: Secondary | ICD-10-CM | POA: Diagnosis not present

## 2019-09-29 DIAGNOSIS — N816 Rectocele: Secondary | ICD-10-CM | POA: Diagnosis not present

## 2019-09-29 NOTE — Progress Notes (Signed)
Briana Schroeder 1946-01-17 [id number]  SUBJECTIVE:  74 y.o. G2P2 female for annual routine gynecologic exam and Pap smear.  Notes an occasional dull ache in her abdomen when laying down only, no problem when upright and moving about. No vaginal discharge or bleeding.  It is not that bothersome to her. She has no gynecologic concerns.  Current Outpatient Medications  Medication Sig Dispense Refill  . amLODipine (NORVASC) 10 MG tablet Take 1 tablet by mouth once daily 90 tablet 0  . gabapentin (NEURONTIN) 300 MG capsule Take 1 capsule (300 mg total) by mouth at bedtime. Annual appt is due must see provider for future refills 30 capsule 0  . hydrochlorothiazide (HYDRODIURIL) 25 MG tablet Take 1 tablet (25 mg total) by mouth daily. Annual appt due in March must see provider for future refills 90 tablet 0  . Multiple Vitamin (MULTIVITAMIN) tablet Take 1 tablet by mouth daily.    . pantoprazole (PROTONIX) 40 MG tablet Take 1 tablet by mouth once daily 90 tablet 1  . potassium chloride (KLOR-CON) 10 MEQ tablet Take 1 tablet (10 mEq total) by mouth daily. Annual appt due in March must see provider for future refills 90 tablet 0  . VITAMIN D, CHOLECALCIFEROL, PO Take 5,000 Units by mouth daily.     No current facility-administered medications for this visit.   Allergies: Sertraline hcl  No LMP recorded. Patient has had a hysterectomy.  Past medical history,surgical history, problem list, medications, allergies, family history and social history were all reviewed and documented as reviewed in the EPIC chart.  ROS:  Feeling well. No dyspnea or chest pain on exertion.  Intermittent abdominal pain as noted above, no change in bowel habits, black or bloody stools.  No urinary tract symptoms. GYN ROS:  no abnormal bleeding, pelvic pain or discharge, no breast pain or new or enlarging lumps on self exam. No neurological complaints.   OBJECTIVE:  BP 124/82   Ht 5\' 2"  (1.575 m)   Wt 135 lb (61.2 kg)    BMI 24.69 kg/m  The patient appears well, alert, oriented x 3, in no distress. ENT normal.  Neck supple. No cervical or supraclavicular adenopathy or thyromegaly.  Lungs are clear, good air entry, no wheezes, rhonchi or rales. S1 and S2 normal, no murmurs, regular rate and rhythm.  Abdomen soft without tenderness, guarding, mass or organomegaly.  Neurological is normal, no focal findings.  BREAST EXAM: breasts appear normal, no suspicious masses, no skin or nipple changes or axillary nodes  PELVIC EXAM: VULVA: normal appearing vulva with no masses, tenderness or lesions, VAGINA: normal appearing vagina with normal color and discharge, no lesions, grade 2 rectocele, no cystocele, grade 1 vaginal vault prolapse, CERVIX: surgically absent, UTERUS: surgically absent, vaginal cuff normal, ADNEXA: no masses, RECTAL: normal rectal, no masses  Chaperone: Caryn Bee present during the examination  ASSESSMENT:  74 y.o. G2P2 here for annual gynecologic exam  PLAN:   1. Postmenopausal.  Prior TAH for leiomyoma.  No hot flashes or night sweats. 2.  Abdominal pain.  Sounds to be intermittent and positional.  Likely bowel related.  No discomfort when laying down on exam table today.  No abnormalities on pelvic exam other than a rectocele.  Not have any trouble with bowel movements.  Recommend that she keep an eye on the symptoms and let us know if it is getting more bothersome. 3.  Rectocele.  Asymptomatic.  We discussed the finding and she is not having any trouble with  bowel function.  Recommend monitoring at annual exam. 4. Pap smear 2010.  No significant history of abnormal Pap smears.  She is comfortable with not continuing Pap smear screening based on current screening guidelines due to age and prior hysterectomy. 5. Mammogram 10/2016.  Normal breast exam today.  Encouraged her to schedule an annual mammogram which she says she will do. 6. Colonoscopy 2016.  Recommended that she follow up at the  recommended interval.   7.  Osteopenia.  DEXA 12/2018.  T score -1.7 at right femoral neck.  FRAX 12% / 2.5%.  Stable from prior study.  Next DEXA recommended 2022 so we will review this at her next annual exam. 8. Health maintenance.  No labs today as she normally has these completed with her primary care provider.  Return annually or sooner, prn.  Joseph Pierini MD, FACOG  09/29/19

## 2019-10-08 ENCOUNTER — Ambulatory Visit (INDEPENDENT_AMBULATORY_CARE_PROVIDER_SITE_OTHER): Payer: Medicare Other | Admitting: Internal Medicine

## 2019-10-08 ENCOUNTER — Other Ambulatory Visit: Payer: Self-pay

## 2019-10-08 ENCOUNTER — Encounter: Payer: Self-pay | Admitting: Internal Medicine

## 2019-10-08 VITALS — BP 132/84 | HR 62 | Temp 98.1°F | Ht 62.0 in | Wt 138.0 lb

## 2019-10-08 DIAGNOSIS — F411 Generalized anxiety disorder: Secondary | ICD-10-CM

## 2019-10-08 DIAGNOSIS — R739 Hyperglycemia, unspecified: Secondary | ICD-10-CM

## 2019-10-08 DIAGNOSIS — R7302 Impaired glucose tolerance (oral): Secondary | ICD-10-CM | POA: Diagnosis not present

## 2019-10-08 DIAGNOSIS — E538 Deficiency of other specified B group vitamins: Secondary | ICD-10-CM

## 2019-10-08 DIAGNOSIS — E559 Vitamin D deficiency, unspecified: Secondary | ICD-10-CM | POA: Diagnosis not present

## 2019-10-08 DIAGNOSIS — I10 Essential (primary) hypertension: Secondary | ICD-10-CM | POA: Diagnosis not present

## 2019-10-08 DIAGNOSIS — D1723 Benign lipomatous neoplasm of skin and subcutaneous tissue of right leg: Secondary | ICD-10-CM | POA: Diagnosis not present

## 2019-10-08 DIAGNOSIS — R252 Cramp and spasm: Secondary | ICD-10-CM | POA: Diagnosis not present

## 2019-10-08 DIAGNOSIS — M5412 Radiculopathy, cervical region: Secondary | ICD-10-CM

## 2019-10-08 DIAGNOSIS — E785 Hyperlipidemia, unspecified: Secondary | ICD-10-CM | POA: Diagnosis not present

## 2019-10-08 LAB — CBC WITH DIFFERENTIAL/PLATELET
Basophils Absolute: 0 10*3/uL (ref 0.0–0.1)
Basophils Relative: 1.1 % (ref 0.0–3.0)
Eosinophils Absolute: 0.1 10*3/uL (ref 0.0–0.7)
Eosinophils Relative: 2.3 % (ref 0.0–5.0)
HCT: 40.3 % (ref 36.0–46.0)
Hemoglobin: 13.4 g/dL (ref 12.0–15.0)
Lymphocytes Relative: 25.3 % (ref 12.0–46.0)
Lymphs Abs: 1.1 10*3/uL (ref 0.7–4.0)
MCHC: 33.1 g/dL (ref 30.0–36.0)
MCV: 92.1 fl (ref 78.0–100.0)
Monocytes Absolute: 0.4 10*3/uL (ref 0.1–1.0)
Monocytes Relative: 8.5 % (ref 3.0–12.0)
Neutro Abs: 2.7 10*3/uL (ref 1.4–7.7)
Neutrophils Relative %: 62.8 % (ref 43.0–77.0)
Platelets: 224 10*3/uL (ref 150.0–400.0)
RBC: 4.38 Mil/uL (ref 3.87–5.11)
RDW: 14.9 % (ref 11.5–15.5)
WBC: 4.3 10*3/uL (ref 4.0–10.5)

## 2019-10-08 LAB — HEPATIC FUNCTION PANEL
ALT: 29 U/L (ref 0–35)
AST: 42 U/L — ABNORMAL HIGH (ref 0–37)
Albumin: 4.2 g/dL (ref 3.5–5.2)
Alkaline Phosphatase: 25 U/L — ABNORMAL LOW (ref 39–117)
Bilirubin, Direct: 0.1 mg/dL (ref 0.0–0.3)
Total Bilirubin: 0.5 mg/dL (ref 0.2–1.2)
Total Protein: 7.2 g/dL (ref 6.0–8.3)

## 2019-10-08 LAB — URINALYSIS, ROUTINE W REFLEX MICROSCOPIC
Bilirubin Urine: NEGATIVE
Hgb urine dipstick: NEGATIVE
Ketones, ur: NEGATIVE
Nitrite: NEGATIVE
Specific Gravity, Urine: 1.02 (ref 1.000–1.030)
Total Protein, Urine: NEGATIVE
Urine Glucose: NEGATIVE
Urobilinogen, UA: 0.2 (ref 0.0–1.0)
pH: 6.5 (ref 5.0–8.0)

## 2019-10-08 LAB — HEMOGLOBIN A1C: Hgb A1c MFr Bld: 6.2 % (ref 4.6–6.5)

## 2019-10-08 LAB — VITAMIN B12: Vitamin B-12: 1105 pg/mL — ABNORMAL HIGH (ref 211–911)

## 2019-10-08 LAB — LIPID PANEL
Cholesterol: 233 mg/dL — ABNORMAL HIGH (ref 0–200)
HDL: 93.5 mg/dL (ref >39.00)
LDL Cholesterol: 123 mg/dL — ABNORMAL HIGH (ref 0–99)
NonHDL: 139.95
Total CHOL/HDL Ratio: 2
Triglycerides: 86 mg/dL (ref 0.0–149.0)
VLDL: 17.2 mg/dL (ref 0.0–40.0)

## 2019-10-08 LAB — BASIC METABOLIC PANEL
BUN: 22 mg/dL (ref 6–23)
CO2: 31 mEq/L (ref 19–32)
Calcium: 10.2 mg/dL (ref 8.4–10.5)
Chloride: 100 mEq/L (ref 96–112)
Creatinine, Ser: 0.81 mg/dL (ref 0.40–1.20)
GFR: 83.4 mL/min (ref >60.00)
Glucose, Bld: 103 mg/dL — ABNORMAL HIGH (ref 70–99)
Potassium: 4.3 mEq/L (ref 3.5–5.1)
Sodium: 138 mEq/L (ref 135–145)

## 2019-10-08 LAB — TSH: TSH: 1.04 u[IU]/mL (ref 0.35–4.50)

## 2019-10-08 LAB — VITAMIN D 25 HYDROXY (VIT D DEFICIENCY, FRACTURES): VITD: 93.41 ng/mL (ref 30.00–100.00)

## 2019-10-08 MED ORDER — GABAPENTIN 300 MG PO CAPS: 300.0000 mg | ORAL_CAPSULE | Freq: Every day | ORAL | 1 refills | Status: DC

## 2019-10-08 MED ORDER — TIZANIDINE HCL 2 MG PO TABS: 2.0000 mg | ORAL_TABLET | Freq: Every day | ORAL | 5 refills | Status: DC

## 2019-10-08 NOTE — Assessment & Plan Note (Signed)
For muscle relaxer qhs prn

## 2019-10-08 NOTE — Assessment & Plan Note (Signed)
D/w pt, c/w benign, ok to follow

## 2019-10-08 NOTE — Patient Instructions (Signed)
Please take all new medication as prescribed - the tizanidine at bedtime for cramps as needed  Please continue all other medications as before, and refills have been done if requested - the gabapentin  Please have the pharmacy call with any other refills you may need.  Please continue your efforts at being more active, low cholesterol diet, and weight control.  You are otherwise up to date with prevention measures today.  Please keep your appointments with your specialists as you may have planned  Please go to the LAB at the blood drawing area for the tests to be done  You will be contacted by phone if any changes need to be made immediately.  Otherwise, you will receive a letter about your results with an explanation, but please check with MyChart first.  Please remember to sign up for MyChart if you have not done so, as this will be important to you in the future with finding out test results, communicating by private email, and scheduling acute appointments online when needed.  Please make an Appointment to return in 6 months, or sooner if needed

## 2019-10-08 NOTE — Assessment & Plan Note (Signed)
stable overall by history and exam, recent data reviewed with pt, and pt to continue medical treatment as before,  to f/u any worsening symptoms or concerns  

## 2019-10-08 NOTE — Progress Notes (Signed)
Subjective:    Patient ID: Briana Schroeder, female    DOB: [birth year], 74 y.o.   MRN: [id number]  HPI  Here with c/o left neck, upper back and shoulder pain similar to previous, ran out of gabapentin which helped at night as pain only at night with lying down.  Also has a new lump to the right anterior mid thigh nontender and mobile, just wondering what to do.  Also has toe and feet cramps at night most nights, has not tried muscle relaxer before.  Pt denies chest pain, increased sob or doe, wheezing, orthopnea, PND, increased LE swelling, palpitations, dizziness or syncope.  Pt denies new neurological symptoms such as new headache, or facial or extremity weakness or numbness   Pt denies polydipsia, polyuria Past Medical History:  Diagnosis Date  . ALLERGIC RHINITIS 08/02/2009  . Allergy   . ANXIETY 10/18/2008  . ARM PAIN, RIGHT 04/04/2009  . BACK PAIN 08/02/2009  . CHEST PAIN 12/05/2009  . COLONIC POLYPS, HX OF 10/18/2008  . DIZZINESS 12/05/2009  . EPIGASTRIC PAIN 01/09/2010  . FATIGUE 09/06/2009  . GERD 12/06/2009  . HOARSENESS 09/06/2009  . HYPERLIPIDEMIA 10/18/2008  . HYPERTENSION 10/18/2008  . IBS 10/18/2008  . MYALGIA 03/21/2009  . Osteopenia 12/2018   T score -1.7 FRAX 12% / 2.5% table from prior DEXA 2018  . PULMONARY SARCOIDOSIS 10/18/2008  . SINUSITIS- ACUTE-NOS 03/21/2009  . VOCAL CORD NODULE 09/06/2009   Past Surgical History:  Procedure Laterality Date  . ABDOMINAL HYSTERECTOMY  08/1992   TAH-DR G FOR FIBROIDS AND MENORRHAGIA  . COLONOSCOPY  11-24-2009   3 polyps, +TA and HPP  . hx of vocal cord nodules    . s/p right finger tendon surgury    . UPPER GASTROINTESTINAL ENDOSCOPY  01-08-2010   normal    reports that she has never smoked. She has never used smokeless tobacco. She reports that she does not drink alcohol or use drugs. family history includes Breast cancer (age of onset: 67) in her mother; Diabetes in her mother; Hypertension in her mother and sister. Allergies  Allergen  Reactions  . Sertraline Hcl Diarrhea   Current Outpatient Medications on File Prior to Visit  Medication Sig Dispense Refill  . amLODipine (NORVASC) 10 MG tablet Take 1 tablet by mouth once daily 90 tablet 0  . hydrochlorothiazide (HYDRODIURIL) 25 MG tablet Take 1 tablet (25 mg total) by mouth daily. Annual appt due in March must see provider for future refills 90 tablet 0  . Multiple Vitamin (MULTIVITAMIN) tablet Take 1 tablet by mouth daily.    . pantoprazole (PROTONIX) 40 MG tablet Take 1 tablet by mouth once daily 90 tablet 1  . potassium chloride (KLOR-CON) 10 MEQ tablet Take 1 tablet (10 mEq total) by mouth daily. Annual appt due in March must see provider for future refills 90 tablet 0  . VITAMIN D, CHOLECALCIFEROL, PO Take 5,000 Units by mouth daily.     No current facility-administered medications on file prior to visit.   Review of Systems All otherwise neg per pt     Objective:   Physical Exam BP 132/84   Pulse 62   Temp 98.1 F (36.7 C)   Ht 5\' 2"  (1.575 m)   Wt 138 lb (62.6 kg)   SpO2 99%   BMI 25.24 kg/m  VS noted,  Constitutional: Pt appears in NAD HENT: Head: NCAT.  Right Ear: External ear normal.  Left Ear: External ear normal.  Eyes: . Pupils are  equal, round, and reactive to light. Conjunctivae and EOM are normal Nose: without d/c or deformity Neck: Neck supple. Gross normal ROM Cardiovascular: Normal rate and regular rhythm.   Pulmonary/Chest: Effort normal and breath sounds without rales or wheezing.  Abd:  Soft, NT, ND, + BS, no organomegaly Left shoudler nt and from Right mid anterior mid thigh with subq nodule approx 1 cm, smooth, mobile, nontender and no overlying skin change Neurological: Pt is alert. At baseline orientation, motor grossly intact Skin: Skin is warm. No rashes, other new lesions, no LE edema Psychiatric: Pt behavior is normal without agitation  All otherwise neg per pt Lab Results  Component Value Date   WBC 4.3 10/08/2019    HGB 13.4 10/08/2019   HCT 40.3 10/08/2019   PLT 224.0 10/08/2019   GLUCOSE 103 (H) 10/08/2019   CHOL 233 (H) 10/08/2019   TRIG 86.0 10/08/2019   HDL 93.50 10/08/2019   LDLDIRECT 83.3 11/17/2012   LDLCALC 123 (H) 10/08/2019   ALT 29 10/08/2019   AST 42 (H) 10/08/2019   NA 138 10/08/2019   K 4.3 10/08/2019   CL 100 10/08/2019   CREATININE 0.81 10/08/2019   BUN 22 10/08/2019   CO2 31 10/08/2019   TSH 1.04 10/08/2019   HGBA1C 6.2 10/08/2019      Assessment & Plan:

## 2019-10-08 NOTE — Assessment & Plan Note (Addendum)
stable overall by history and exam, recent data reviewed with pt, and pt to continue medical treatment as before,  to f/u any worsening symptoms or concerns  I spent 31 minutes in preparing to see the patient by review of recent labs, imaging and procedures, obtaining and reviewing separately obtained history, communicating with the patient and family or caregiver, ordering medications, tests or procedures, and documenting clinical information in the EHR including the differential Dx, treatment, and any further evaluation and other management of radiculitis, lipoma, hyperglycemia, htn, HLD, foot cramps, anxiety

## 2019-10-10 ENCOUNTER — Other Ambulatory Visit: Payer: Self-pay | Admitting: Internal Medicine

## 2019-10-11 ENCOUNTER — Other Ambulatory Visit: Payer: Self-pay | Admitting: Internal Medicine

## 2019-10-11 MED ORDER — ATORVASTATIN CALCIUM 10 MG PO TABS: 10.0000 mg | ORAL_TABLET | Freq: Every day | ORAL | 3 refills | Status: DC

## 2019-10-11 NOTE — Telephone Encounter (Signed)
Please refill as per office routine med refill policy (all routine meds refilled for 3 mo or monthly per pt preference up to one year from last visit, then month to month grace period for 3 mo, then further med refills will have to be denied)  

## 2019-10-12 ENCOUNTER — Other Ambulatory Visit: Payer: Self-pay

## 2019-10-12 MED ORDER — ATORVASTATIN CALCIUM 10 MG PO TABS: 10.0000 mg | ORAL_TABLET | Freq: Every day | ORAL | 3 refills | Status: DC

## 2019-10-12 NOTE — Telephone Encounter (Signed)
New message    The patient wants to know why she was put on medication  atorvastatin (LIPITOR) 10 MG tablet asking for a call back.

## 2019-10-12 NOTE — Telephone Encounter (Signed)
erx sent as requested.  

## 2019-10-13 ENCOUNTER — Other Ambulatory Visit: Payer: Self-pay | Admitting: Internal Medicine

## 2019-10-13 NOTE — Telephone Encounter (Signed)
Dr. Jenny Reichmann please clarify dose...is it take one tab daily?   Please advise

## 2019-10-13 NOTE — Telephone Encounter (Signed)
    Please update the instructions on potassium chloride (KLOR-CON) 10 MEQ tablet Patient states she has no medication remaining and pharmacy will not fill until directions clarified

## 2019-10-13 NOTE — Telephone Encounter (Signed)
Yes, one per day - ok to refill

## 2019-10-14 MED ORDER — POTASSIUM CHLORIDE ER 10 MEQ PO TBCR: EXTENDED_RELEASE_TABLET | ORAL | 1 refills | Status: DC

## 2019-10-14 NOTE — Telephone Encounter (Signed)
Called pt no answer LMOM updated script sen to pof.Marland KitchenJohny Chess

## 2019-10-24 ENCOUNTER — Other Ambulatory Visit: Payer: Self-pay | Admitting: Internal Medicine

## 2019-10-24 NOTE — Telephone Encounter (Signed)
Please refill as per office routine med refill policy (all routine meds refilled for 3 mo or monthly per pt preference up to one year from last visit, then month to month grace period for 3 mo, then further med refills will have to be denied)  

## 2019-11-17 ENCOUNTER — Other Ambulatory Visit: Payer: Self-pay

## 2019-11-17 ENCOUNTER — Ambulatory Visit (INDEPENDENT_AMBULATORY_CARE_PROVIDER_SITE_OTHER): Payer: Medicare Other

## 2019-11-17 VITALS — BP 122/70 | HR 61 | Temp 97.8°F | Resp 16 | Ht 62.0 in | Wt 142.8 lb

## 2019-11-17 DIAGNOSIS — Z0000 Encounter for general adult medical examination without abnormal findings: Secondary | ICD-10-CM

## 2019-11-17 NOTE — Progress Notes (Signed)
Subjective:   Briana Schroeder is a 74 y.o. female who presents for Medicare Annual (Subsequent) preventive examination.  Review of Systems:  No ROS. Medicare Wellness Visit. Additional risk factors are reflected in social history. Cardiac Risk Factors include: advanced age (>52men, >35 women);dyslipidemia;hypertension  Sleep Patterns: No sleep issues, feels rested on waking and sleeps 8 hours nightly. Home Safety/Smoke Alarms: Feels safe in home; uses home alarm. Smoke alarms in place. Living environment: 1-story home; Lives with husband; no needs for DME; good support system. Seat Belt Safety/Bike Helmet: Wears seat belt.    Objective:     Vitals: BP 122/70 (BP Location: Left Arm, Patient Position: Sitting, Cuff Size: Normal)   Pulse 61   Temp 97.8 F (36.6 C)   Resp 16   Ht 5\' 2"  (1.575 m)   Wt 142 lb 12.8 oz (64.8 kg)   SpO2 99%   BMI 26.12 kg/m   Body mass index is 26.12 kg/m.  Advanced Directives 11/17/2019 11/11/2018 11/06/2017 10/13/2016 09/04/2016 04/26/2015 04/14/2015  Does Patient Have a Medical Advance Directive? Yes No No No Yes No No  Type of Paramedic of [place];Living will - - - - - -  Does patient want to make changes to medical advance directive? No - Patient declined - - - Yes (ED - Information included in AVS) - -  Copy of [place] in Chart? No - copy requested - - - - - -  Would patient like information on creating a medical advance directive? - Yes (ED - Information included in AVS) Yes (ED - Information included in AVS) No - Patient declined - No - patient declined information -    Tobacco Social History   Tobacco Use  Smoking Status Never Smoker  Smokeless Tobacco Never Used     Counseling given: No   Clinical Intake:  Pre-visit preparation completed: Yes  Pain : 0-10 Pain Score: 8  Pain Type: Chronic pain Pain Location: Shoulder Pain Orientation: Right Pain Descriptors / Indicators: Aching,  Discomfort Pain Onset: More than a month ago Pain Frequency: Intermittent     BMI - recorded: 26.1 Nutritional Status: BMI 25 -29 Overweight Nutritional Risks: None Diabetes: No  How often do you need to have someone help you when you read instructions, pamphlets, or other written materials from your doctor or pharmacy?: 1 - Never What is the last grade level you completed in school?: Associate's Degree in Childcare  Interpreter Needed?: No  Information entered by :: Janira Mandell N. Lowell Guitar, LPN  Past Medical History:  Diagnosis Date  . ALLERGIC RHINITIS 08/02/2009  . Allergy   . ANXIETY 10/18/2008  . ARM PAIN, RIGHT 04/04/2009  . BACK PAIN 08/02/2009  . CHEST PAIN 12/05/2009  . COLONIC POLYPS, HX OF 10/18/2008  . DIZZINESS 12/05/2009  . EPIGASTRIC PAIN 01/09/2010  . FATIGUE 09/06/2009  . GERD 12/06/2009  . HOARSENESS 09/06/2009  . HYPERLIPIDEMIA 10/18/2008  . HYPERTENSION 10/18/2008  . IBS 10/18/2008  . MYALGIA 03/21/2009  . Osteopenia 12/2018   T score -1.7 FRAX 12% / 2.5% table from prior DEXA 2018  . PULMONARY SARCOIDOSIS 10/18/2008  . SINUSITIS- ACUTE-NOS 03/21/2009  . VOCAL CORD NODULE 09/06/2009   Past Surgical History:  Procedure Laterality Date  . ABDOMINAL HYSTERECTOMY  08/1992   TAH-DR G FOR FIBROIDS AND MENORRHAGIA  . COLONOSCOPY  11-24-2009   3 polyps, +TA and HPP  . hx of vocal cord nodules    . s/p right finger tendon  surgury    . UPPER GASTROINTESTINAL ENDOSCOPY  01-08-2010   normal   Family History  Problem Relation Age of Onset  . Diabetes Mother   . Hypertension Mother   . Breast cancer Mother 22  . Hypertension Sister   . Colon cancer Neg Hx   . Colon polyps Neg Hx   . Rectal cancer Neg Hx   . Stomach cancer Neg Hx    Social History   Socioeconomic History  . Marital status: Married    Spouse name: Not on file  . Number of children: 2  . Years of education: Not on file  . Highest education level: Not on file  Occupational History  . Occupation: Oncologist  Tobacco Use  . Smoking status: Never Smoker  . Smokeless tobacco: Never Used  Substance and Sexual Activity  . Alcohol use: No    Alcohol/week: 0.0 standard drinks  . Drug use: No  . Sexual activity: Not Currently    Birth control/protection: Surgical    Comment: 1st intercourse 7 yo-1 partner  Other Topics Concern  . Not on file  Social History Narrative  . Not on file   Social Determinants of Health   Financial Resource Strain:   . Difficulty of Paying Living Expenses:   Food Insecurity:   . Worried About Charity fundraiser in the Last Year:   . Arboriculturist in the Last Year:   Transportation Needs:   . Film/video editor (Medical):   Marland Kitchen Lack of Transportation (Non-Medical):   Physical Activity:   . Days of Exercise per Week:   . Minutes of Exercise per Session:   Stress:   . Feeling of Stress :   Social Connections:   . Frequency of Communication with Friends and Family:   . Frequency of Social Gatherings with Friends and Family:   . Attends Religious Services:   . Active Member of Clubs or Organizations:   . Attends Archivist Meetings:   Marland Kitchen Marital Status:     Outpatient Encounter Medications as of 11/17/2019  Medication Sig  . amLODipine (NORVASC) 10 MG tablet Take 1 tablet by mouth once daily  . atorvastatin (LIPITOR) 10 MG tablet Take 1 tablet (10 mg total) by mouth daily.  Marland Kitchen gabapentin (NEURONTIN) 300 MG capsule Take 1 capsule (300 mg total) by mouth at bedtime.  . hydrochlorothiazide (HYDRODIURIL) 25 MG tablet TAKE 1 TABLET BY MOUTH ONCE DAILY. ANNUAL APPT DUE IN [facility] MUST SEE PROVIDER FOR FUTURE REFILLS.  . Multiple Vitamin (MULTIVITAMIN) tablet Take 1 tablet by mouth daily.  . pantoprazole (PROTONIX) 40 MG tablet Take 1 tablet by mouth once daily  . potassium chloride (KLOR-CON) 10 MEQ tablet Take 1 tablet by mouth once a day  . tiZANidine (ZANAFLEX) 2 MG tablet Take 1 tablet (2 mg total) by mouth at bedtime.  Marland Kitchen  VITAMIN D, CHOLECALCIFEROL, PO Take 5,000 Units by mouth daily.   No facility-administered encounter medications on file as of 11/17/2019.    Activities of Daily Living In your present state of health, do you have any difficulty performing the following activities: 11/17/2019  Hearing? N  Vision? N  Difficulty concentrating or making decisions? Y  Walking or climbing stairs? N  Dressing or bathing? N  Doing errands, shopping? N  Preparing Food and eating ? N  Using the Toilet? N  In the past six months, have you accidently leaked urine? N  Do you have problems with  loss of bowel control? N  Managing your Medications? N  Managing your Finances? N  Housekeeping or managing your Housekeeping? N  Some recent data might be hidden    Patient Care Team: Biagio Borg, MD as PCP - General Fontaine, Belinda Block, MD (Inactive) as Consulting Physician (Gynecology) Ladene Artist, MD as Consulting Physician (Gastroenterology) Paulla Dolly Tamala Fothergill, DPM as Consulting Physician (Podiatry)    Assessment:   This is a routine wellness examination for Ardra.  Exercise Activities and Dietary recommendations Current Exercise Habits: Home exercise routine, Type of exercise: walking, Time (Minutes): 30, Frequency (Times/Week): 5, Weekly Exercise (Minutes/Week): 150, Intensity: Moderate, Exercise limited by: None identified  Goals    . Increase water intake     Will try to increase my daily intake of water and continue to eat healthy and stay physically active.     . 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     . Patient Stated (pt-stated)     To maintain my current health status by continuing to eat healthy and stay physically active & socially active. Also would like to lose at least 5 pounds.       Fall Risk Fall Risk  11/17/2019 10/08/2019 11/11/2018 09/05/2018 06/17/2018  Falls in the  past year? 1 0 1 0 0  Number falls in past yr: 0 - 0 - -  Injury with Fall? 0 - 0 - -  Risk for fall due to : No Fall Risks - - - -  Follow up Falls evaluation completed;Education provided;Falls prevention discussed - - - -   Is the patient's home free of loose throw rugs in walkways, pet beds, electrical cords, etc?   yes      Grab bars in the bathroom? yes      Handrails on the stairs?   yes      Adequate lighting?   yes   Depression Screen PHQ 2/9 Scores 11/17/2019 10/08/2019 11/11/2018 09/05/2018  PHQ - 2 Score 0 0 0 1  PHQ- 9 Score - - - -     Cognitive Function     6CIT Screen 11/17/2019  What Year? 0 points  What month? 0 points  What time? 0 points  Count back from 20 0 points  Months in reverse 0 points  Repeat phrase 2 points  Total Score 2    Immunization History  Administered Date(s) Administered  . PFIZER SARS-COV-2 Vaccination 08/10/2019, 09/07/2019  . Pneumococcal Conjugate-13 05/21/2013  . Pneumococcal Polysaccharide-23 09/06/2009, 11/06/2017  . Td 10/18/2008  . Zoster 10/18/2008    Qualifies for Shingles Vaccine? Yes, will check with local pharmacy.  Screening Tests Health Maintenance  Topic Date Due  . TETANUS/TDAP  02/25/2020 (Originally 10/19/2018)  . COLONOSCOPY  04/25/2020  . DEXA SCAN  Completed  . COVID-19 Vaccine  Completed  . Hepatitis C Screening  Completed  . PNA vac Low Risk Adult  Completed    Cancer Screenings: Lung: Low Dose CT Chest recommended if Age 28-80 years, 30 pack-year currently smoking OR have quit w/in 15years. Patient does not qualify. Breast:  Up to date on Mammogram? No, will schedule 2021  Up to date of Bone Density/Dexa? Yes Colorectal: Not recommended due to age     Plan:     Reviewed health maintenance screenings with patient today and relevant education, vaccines, and/or referrals  were provided.    Continue doing brain stimulating activities (puzzles, reading, adult coloring books, staying active) to keep  memory sharp.    Continue to eat heart healthy diet (full of fruits, vegetables, whole grains, lean protein, water--limit salt, fat, and sugar intake) and increase physical activity as tolerated.  I have personally reviewed and noted the following in the patient's chart:   . Medical and social history . Use of alcohol, tobacco or illicit drugs  . Current medications and supplements . Functional ability and status . Nutritional status . Physical activity . Advanced directives . List of other physicians . Hospitalizations, surgeries, and ER visits in previous 12 months . Vitals . Screenings to include cognitive, depression, and falls . Referrals and appointments  In addition, I have reviewed and discussed with patient certain preventive protocols, quality metrics, and best practice recommendations. A written personalized care plan for preventive services as well as general preventive health recommendations were provided to patient.     Sheral Flow, LPN  [id number] Nurse Health Advisor

## 2019-11-17 NOTE — Patient Instructions (Addendum)
[name] , Thank you for taking time to come for your Medicare Wellness Visit. I appreciate your ongoing commitment to your health goals. Please review the following plan we discussed and let me know if I can assist you in the future.   Screening recommendations/referrals: Colorectal Screening: last done 04/26/2015; due every 3-5 years Mammogram: last done 11/27/2016; due every year Bone Density: last done 12/16/2018; due 2022  Vision and Dental Exams: Recommended annual ophthalmology exams for early detection of glaucoma and other disorders of the eye Recommended annual dental exams for proper oral hygiene  Vaccinations: Influenza vaccine: declined  Pneumococcal vaccine: completed on 05/21/2013, 11/06/2017  Tdap vaccine: last done 10/18/2008; Overdue, done every 10 years. Please call your insurance company to determine your out of pocket expense. You may also receive this vaccine at your local pharmacy or Health Dept.  Shingles vaccine: Please call your insurance company to determine your out of pocket expense for the Shingrix vaccine. You may receive this vaccine at your local pharmacy.  Covid vaccine: completed; [place] 08/10/2019, 09/07/2019  Advanced directives: Advance directives discussed with you today. Please bring a copy of your POA (Power of [place]) and/or Living Will to your next appointment.  Goals:  Recommend to drink at least 6-8 8oz glasses of water per day.  Recommend to exercise for at least 150 minutes per week.  Recommend to remove any items from the home that may cause slips or trips.  Recommend to decrease portion sizes by eating 3 small healthy meals and at least 2 healthy snacks per day.  Recommend to begin DASH diet as directed below  Recommend to continue efforts to reduce smoking habits until no longer smoking. Smoking Cessation literature is attached below.  Next appointment: Please schedule your Annual Wellness Visit with your Nurse Health Advisor in one  year.  Preventive Care 75 Years and Older, Female Preventive care refers to lifestyle choices and visits with your health care provider that can promote health and wellness. What does preventive care include?  A yearly physical exam. This is also called an annual well check.  Dental exams once or twice a year.  Routine eye exams. Ask your health care provider how often you should have your eyes checked.  Personal lifestyle choices, including:  Daily care of your teeth and gums.  Regular physical activity.  Eating a healthy diet.  Avoiding tobacco and drug use.  Limiting alcohol use.  Practicing safe sex.  Taking low-dose aspirin every day if recommended by your health care provider.  Taking vitamin and mineral supplements as recommended by your health care provider. What happens during an annual well check? The services and screenings done by your health care provider during your annual well check will depend on your age, overall health, lifestyle risk factors, and family history of disease. Counseling  Your health care provider may ask you questions about your:  Alcohol use.  Tobacco use.  Drug use.  Emotional well-being.  Home and relationship well-being.  Sexual activity.  Eating habits.  History of falls.  Memory and ability to understand (cognition).  Work and work Statistician.  Reproductive health. Screening  You may have the following tests or measurements:  Height, weight, and BMI.  Blood pressure.  Lipid and cholesterol levels. These may be checked every 5 years, or more frequently if you are over 102 years old.  Skin check.  Lung cancer screening. You may have this screening every year starting at age 23 if you have a 30-pack-year  history of smoking and currently smoke or have quit within the past 15 years.  Fecal occult blood test (FOBT) of the stool. You may have this test every year starting at age 52.  Flexible sigmoidoscopy or  colonoscopy. You may have a sigmoidoscopy every 5 years or a colonoscopy every 10 years starting at age 19.  Hepatitis C blood test.  Hepatitis B blood test.  Sexually transmitted disease (STD) testing.  Diabetes screening. This is done by checking your blood sugar (glucose) after you have not eaten for a while (fasting). You may have this done every 1-3 years.  Bone density scan. This is done to screen for osteoporosis. You may have this done starting at age 93.  Mammogram. This may be done every 1-2 years. Talk to your health care provider about how often you should have regular mammograms. Talk with your health care provider about your test results, treatment options, and if necessary, the need for more tests. Vaccines  Your health care provider may recommend certain vaccines, such as:  Influenza vaccine. This is recommended every year.  Tetanus, diphtheria, and acellular pertussis (Tdap, Td) vaccine. You may need a Td booster every 10 years.  Zoster vaccine. You may need this after age 76.  Pneumococcal 13-valent conjugate (PCV13) vaccine. One dose is recommended after age 81.  Pneumococcal polysaccharide (PPSV23) vaccine. One dose is recommended after age 26. Talk to your health care provider about which screenings and vaccines you need and how often you need them. This information is not intended to replace advice given to you by your health care provider. Make sure you discuss any questions you have with your health care provider. Document Released: 07/15/2015 Document Revised: 03/07/2016 Document Reviewed: 04/19/2015 Elsevier Interactive Patient Education  2017 [place] Prevention in the Home Falls can cause injuries. They can happen to people of all ages. There are many things you can do to make your home safe and to help prevent falls. What can I do on the outside of my home?  Regularly fix the edges of walkways and driveways and fix any cracks.  Remove  anything that might make you trip as you walk through a door, such as a raised step or threshold.  Trim any bushes or trees on the path to your home.  Use bright outdoor lighting.  Clear any walking paths of anything that might make someone trip, such as rocks or tools.  Regularly check to see if handrails are loose or broken. Make sure that both sides of any steps have handrails.  Any raised decks and porches should have guardrails on the edges.  Have any leaves, snow, or ice cleared regularly.  Use sand or salt on walking paths during winter.  Clean up any spills in your garage right away. This includes oil or grease spills. What can I do in the bathroom?  Use night lights.  Install grab bars by the toilet and in the tub and shower. Do not use towel bars as grab bars.  Use non-skid mats or decals in the tub or shower.  If you need to sit down in the shower, use a plastic, non-slip stool.  Keep the floor dry. Clean up any water that spills on the floor as soon as it happens.  Remove soap buildup in the tub or shower regularly.  Attach bath mats securely with double-sided non-slip rug tape.  Do not have throw rugs and other things on the floor that can make you trip.  What can I do in the bedroom?  Use night lights.  Make sure that you have a light by your bed that is easy to reach.  Do not use any sheets or blankets that are too big for your bed. They should not hang down onto the floor.  Have a firm chair that has side arms. You can use this for support while you get dressed.  Do not have throw rugs and other things on the floor that can make you trip. What can I do in the kitchen?  Clean up any spills right away.  Avoid walking on wet floors.  Keep items that you use a lot in easy-to-reach places.  If you need to reach something above you, use a strong step stool that has a grab bar.  Keep electrical cords out of the way.  Do not use floor polish or wax that  makes floors slippery. If you must use wax, use non-skid floor wax.  Do not have throw rugs and other things on the floor that can make you trip. What can I do with my stairs?  Do not leave any items on the stairs.  Make sure that there are handrails on both sides of the stairs and use them. Fix handrails that are broken or loose. Make sure that handrails are as long as the stairways.  Check any carpeting to make sure that it is firmly attached to the stairs. Fix any carpet that is loose or worn.  Avoid having throw rugs at the top or bottom of the stairs. If you do have throw rugs, attach them to the floor with carpet tape.  Make sure that you have a light switch at the top of the stairs and the bottom of the stairs. If you do not have them, ask someone to add them for you. What else can I do to help prevent falls?  Wear shoes that:  Do not have high heels.  Have rubber bottoms.  Are comfortable and fit you well.  Are closed at the toe. Do not wear sandals.  If you use a stepladder:  Make sure that it is fully opened. Do not climb a closed stepladder.  Make sure that both sides of the stepladder are locked into place.  Ask someone to hold it for you, if possible.  Clearly mark and make sure that you can see:  Any grab bars or handrails.  First and last steps.  Where the edge of each step is.  Use tools that help you move around (mobility aids) if they are needed. These include:  Canes.  Walkers.  Scooters.  Crutches.  Turn on the lights when you go into a dark area. Replace any light bulbs as soon as they burn out.  Set up your furniture so you have a clear path. Avoid moving your furniture around.  If any of your floors are uneven, fix them.  If there are any pets around you, be aware of where they are.  Review your medicines with your doctor. Some medicines can make you feel dizzy. This can increase your chance of falling. Ask your doctor what other  things that you can do to help prevent falls. This information is not intended to replace advice given to you by your health care provider. Make sure you discuss any questions you have with your health care provider. Document Released: 04/14/2009 Document Revised: 11/24/2015 Document Reviewed: 07/23/2014 Elsevier Interactive Patient Education  2017 [employer].

## 2019-11-28 ENCOUNTER — Other Ambulatory Visit: Payer: Self-pay | Admitting: Internal Medicine

## 2019-11-28 NOTE — Telephone Encounter (Signed)
Please refill as per office routine med refill policy (all routine meds refilled for 3 mo or monthly per pt preference up to one year from last visit, then month to month grace period for 3 mo, then further med refills will have to be denied)  

## 2020-01-04 ENCOUNTER — Other Ambulatory Visit: Payer: Self-pay | Admitting: Internal Medicine

## 2020-01-04 NOTE — Telephone Encounter (Signed)
Please refill as per office routine med refill policy (all routine meds refilled for 3 mo or monthly per pt preference up to one year from last visit, then month to month grace period for 3 mo, then further med refills will have to be denied)  

## 2020-01-19 ENCOUNTER — Other Ambulatory Visit: Payer: Self-pay | Admitting: Internal Medicine

## 2020-01-19 DIAGNOSIS — Z1231 Encounter for screening mammogram for malignant neoplasm of breast: Secondary | ICD-10-CM

## 2020-02-03 ENCOUNTER — Other Ambulatory Visit: Payer: Self-pay

## 2020-02-03 ENCOUNTER — Ambulatory Visit
Admission: RE | Admit: 2020-02-03 | Discharge: 2020-02-03 | Disposition: A | Discharge status: Home / Self Care | Payer: Medicare Other | Source: Ambulatory Visit | Attending: Internal Medicine | Admitting: Internal Medicine

## 2020-02-03 DIAGNOSIS — Z1231 Encounter for screening mammogram for malignant neoplasm of breast: Secondary | ICD-10-CM

## 2020-02-04 ENCOUNTER — Other Ambulatory Visit: Payer: Self-pay | Admitting: Internal Medicine

## 2020-02-04 NOTE — Telephone Encounter (Signed)
Please refill as per office routine med refill policy (all routine meds refilled for 3 mo or monthly per pt preference up to one year from last visit, then month to month grace period for 3 mo, then further med refills will have to be denied)  

## 2020-03-07 ENCOUNTER — Other Ambulatory Visit: Payer: Self-pay | Admitting: Internal Medicine

## 2020-03-07 NOTE — Telephone Encounter (Signed)
Please refill as per office routine med refill policy (all routine meds refilled for 3 mo or monthly per pt preference up to one year from last visit, then month to month grace period for 3 mo, then further med refills will have to be denied)  

## 2020-04-06 ENCOUNTER — Other Ambulatory Visit: Payer: Self-pay

## 2020-04-07 ENCOUNTER — Encounter: Payer: Self-pay | Admitting: Internal Medicine

## 2020-04-07 ENCOUNTER — Ambulatory Visit (INDEPENDENT_AMBULATORY_CARE_PROVIDER_SITE_OTHER): Payer: Medicare Other | Admitting: Internal Medicine

## 2020-04-07 ENCOUNTER — Ambulatory Visit (INDEPENDENT_AMBULATORY_CARE_PROVIDER_SITE_OTHER): Payer: Medicare Other

## 2020-04-07 VITALS — BP 120/80 | HR 77 | Temp 98.7°F | Ht 62.0 in | Wt 139.0 lb

## 2020-04-07 DIAGNOSIS — M25511 Pain in right shoulder: Secondary | ICD-10-CM

## 2020-04-07 DIAGNOSIS — E785 Hyperlipidemia, unspecified: Secondary | ICD-10-CM

## 2020-04-07 DIAGNOSIS — I700 Atherosclerosis of aorta: Secondary | ICD-10-CM | POA: Diagnosis not present

## 2020-04-07 DIAGNOSIS — I10 Essential (primary) hypertension: Secondary | ICD-10-CM | POA: Diagnosis not present

## 2020-04-07 DIAGNOSIS — R739 Hyperglycemia, unspecified: Secondary | ICD-10-CM

## 2020-04-07 DIAGNOSIS — H900 Conductive hearing loss, bilateral: Secondary | ICD-10-CM | POA: Diagnosis not present

## 2020-04-07 LAB — URINALYSIS, ROUTINE W REFLEX MICROSCOPIC
Bilirubin Urine: NEGATIVE
Hgb urine dipstick: NEGATIVE
Ketones, ur: NEGATIVE
Nitrite: POSITIVE — AB
Specific Gravity, Urine: 1.025 (ref 1.000–1.030)
Total Protein, Urine: NEGATIVE
Urine Glucose: NEGATIVE
Urobilinogen, UA: 0.2 (ref 0.0–1.0)
pH: 5.5 (ref 5.0–8.0)

## 2020-04-07 LAB — HEPATIC FUNCTION PANEL
ALT: 22 U/L (ref 0–35)
AST: 33 U/L (ref 0–37)
Albumin: 4.3 g/dL (ref 3.5–5.2)
Alkaline Phosphatase: 24 U/L — ABNORMAL LOW (ref 39–117)
Bilirubin, Direct: 0.1 mg/dL (ref 0.0–0.3)
Total Bilirubin: 0.5 mg/dL (ref 0.2–1.2)
Total Protein: 7.3 g/dL (ref 6.0–8.3)

## 2020-04-07 LAB — BASIC METABOLIC PANEL
BUN: 27 mg/dL — ABNORMAL HIGH (ref 6–23)
CO2: 34 mEq/L — ABNORMAL HIGH (ref 19–32)
Calcium: 10.1 mg/dL (ref 8.4–10.5)
Chloride: 101 mEq/L (ref 96–112)
Creatinine, Ser: 0.82 mg/dL (ref 0.40–1.20)
GFR: 69.74 mL/min (ref >60.00)
Glucose, Bld: 105 mg/dL — ABNORMAL HIGH (ref 70–99)
Potassium: 4.6 mEq/L (ref 3.5–5.1)
Sodium: 138 mEq/L (ref 135–145)

## 2020-04-07 LAB — LIPID PANEL
Cholesterol: 166 mg/dL (ref 0–200)
HDL: 79.8 mg/dL (ref >39.00)
LDL Cholesterol: 72 mg/dL (ref 0–99)
NonHDL: 86.67
Total CHOL/HDL Ratio: 2
Triglycerides: 73 mg/dL (ref 0.0–149.0)
VLDL: 14.6 mg/dL (ref 0.0–40.0)

## 2020-04-07 LAB — HEMOGLOBIN A1C: Hgb A1c MFr Bld: 6.5 % (ref 4.6–6.5)

## 2020-04-07 MED ORDER — HYDROCHLOROTHIAZIDE 25 MG PO TABS
25.0000 mg | ORAL_TABLET | Freq: Every day | ORAL | 3 refills | Status: DC
Start: 2020-04-07 — End: 2021-04-12

## 2020-04-07 MED ORDER — TRAMADOL HCL 50 MG PO TABS: 50.0000 mg | ORAL_TABLET | Freq: Four times a day (QID) | ORAL | 0 refills | Status: DC | PRN

## 2020-04-07 MED ORDER — POTASSIUM CHLORIDE ER 10 MEQ PO TBCR
EXTENDED_RELEASE_TABLET | ORAL | 3 refills | Status: DC
Start: 2020-04-07 — End: 2021-04-12

## 2020-04-07 MED ORDER — GABAPENTIN 300 MG PO CAPS
300.0000 mg | ORAL_CAPSULE | Freq: Every day | ORAL | 1 refills | Status: DC
Start: 2020-04-07 — End: 2020-08-22

## 2020-04-07 MED ORDER — TIZANIDINE HCL 2 MG PO TABS
2.0000 mg | ORAL_TABLET | Freq: Every day | ORAL | 5 refills | Status: DC
Start: 2020-04-07 — End: 2020-10-18

## 2020-04-07 MED ORDER — PANTOPRAZOLE SODIUM 40 MG PO TBEC
40.0000 mg | DELAYED_RELEASE_TABLET | Freq: Every day | ORAL | 3 refills | Status: DC
Start: 2020-04-07 — End: 2021-04-12

## 2020-04-07 MED ORDER — AMLODIPINE BESYLATE 10 MG PO TABS
10.0000 mg | ORAL_TABLET | Freq: Every day | ORAL | 3 refills | Status: DC
Start: 2020-04-07 — End: 2021-04-12

## 2020-04-07 MED ORDER — ATORVASTATIN CALCIUM 10 MG PO TABS
10.0000 mg | ORAL_TABLET | Freq: Every day | ORAL | 3 refills | Status: DC
Start: 2020-04-07 — End: 2021-02-22

## 2020-04-07 MED ORDER — PREDNISONE 10 MG PO TABS
ORAL_TABLET | ORAL | 0 refills | Status: DC
Start: 2020-04-07 — End: 2020-08-22

## 2020-04-07 NOTE — Progress Notes (Signed)
Subjective:    Patient ID: Briana Schroeder, female    DOB: 09/12/1945, 74 y.o.   MRN: [id number]  HPI  Here to f/u; overall doing ok,  Pt denies chest pain, increasing sob or doe, wheezing, orthopnea, PND, increased LE swelling, palpitations, dizziness or syncope.  Pt denies new neurological symptoms such as new headache, or facial or extremity weakness or numbness.  Pt denies polydipsia, polyuria, or low sugar episode.  Pt states overall good compliance with meds, mostly trying to follow appropriate diet, with wt overall stable,   Wt Readings from Last 3 Encounters:  04/07/20 139 lb (63 kg)  11/17/19 142 lb 12.8 oz (64.8 kg)  10/08/19 138 lb (62.6 kg)   BP Readings from Last 3 Encounters:  04/07/20 120/80  11/17/19 122/70  10/08/19 132/84  aLso has bilateral ear hearing loss worse in the past wk, possible wax again it seems.  aLso c/o left shoulder aching mild intermittent pain for 1 wk after fall but has fROM.   ALso tolerating new statin well Past Medical History:  Diagnosis Date  . ALLERGIC RHINITIS 08/02/2009  . Allergy   . ANXIETY 10/18/2008  . ARM PAIN, RIGHT 04/04/2009  . BACK PAIN 08/02/2009  . CHEST PAIN 12/05/2009  . COLONIC POLYPS, HX OF 10/18/2008  . DIZZINESS 12/05/2009  . EPIGASTRIC PAIN 01/09/2010  . FATIGUE 09/06/2009  . GERD 12/06/2009  . HOARSENESS 09/06/2009  . HYPERLIPIDEMIA 10/18/2008  . HYPERTENSION 10/18/2008  . IBS 10/18/2008  . MYALGIA 03/21/2009  . Osteopenia 12/2018   T score -1.7 FRAX 12% / 2.5% table from prior DEXA 2018  . PULMONARY SARCOIDOSIS 10/18/2008  . SINUSITIS- ACUTE-NOS 03/21/2009  . VOCAL CORD NODULE 09/06/2009   Past Surgical History:  Procedure Laterality Date  . ABDOMINAL HYSTERECTOMY  08/1992   TAH-DR G FOR FIBROIDS AND MENORRHAGIA  . COLONOSCOPY  11-24-2009   3 polyps, +TA and HPP  . hx of vocal cord nodules    . s/p right finger tendon surgury    . UPPER GASTROINTESTINAL ENDOSCOPY  01-08-2010   normal    reports that she has never smoked. She  has never used smokeless tobacco. She reports that she does not drink alcohol and does not use drugs. family history includes Breast cancer (age of onset: 63) in her mother; Diabetes in her mother; Hypertension in her mother and sister. Allergies  Allergen Reactions  . Sertraline Hcl Diarrhea   Current Outpatient Medications on File Prior to Visit  Medication Sig Dispense Refill  . Multiple Vitamin (MULTIVITAMIN) tablet Take 1 tablet by mouth daily.    Marland Kitchen VITAMIN D, CHOLECALCIFEROL, PO Take 5,000 Units by mouth daily.     No current facility-administered medications on file prior to visit.   Review of Systems All otherwise neg per pt     Objective:   Physical Exam BP 120/80 (BP Location: Left Arm, Patient Position: Sitting, Cuff Size: Large)   Pulse 77   Temp 98.7 F (37.1 C) (Oral)   Ht 5\' 2"  (1.575 m)   Wt 139 lb (63 kg)   SpO2 98%   BMI 25.42 kg/m  VS noted,  Constitutional: Pt appears in NAD HENT: Head: NCAT.  Right Ear: External ear normal.  Left Ear: External ear normal.  bilat ears wax impactions resolved with irrigation, hearing improved Eyes: . Pupils are equal, round, and reactive to light. Conjunctivae and EOM are normal Nose: without d/c or deformity Neck: Neck supple. Gross normal ROM Cardiovascular: Normal rate and  regular rhythm.   Left shoulder nt, FROM Pulmonary/Chest: Effort normal and breath sounds without rales or wheezing.  Abd:  Soft, NT, ND, + BS, no organomegaly Neurological: Pt is alert. At baseline orientation, motor grossly intact Skin: Skin is warm. No rashes, other new lesions, no LE edema Psychiatric: Pt behavior is normal without agitation  All otherwise neg per pt Lab Results  Component Value Date   WBC 4.3 10/08/2019   HGB 13.4 10/08/2019   HCT 40.3 10/08/2019   PLT 224.0 10/08/2019   GLUCOSE 105 (H) 04/07/2020   CHOL 166 04/07/2020   TRIG 73.0 04/07/2020   HDL 79.80 04/07/2020   LDLDIRECT 83.3 11/17/2012   LDLCALC 72 04/07/2020     ALT 22 04/07/2020   AST 33 04/07/2020   NA 138 04/07/2020   K 4.6 04/07/2020   CL 101 04/07/2020   CREATININE 0.82 04/07/2020   BUN 27 (H) 04/07/2020   CO2 34 (H) 04/07/2020   TSH 1.04 10/08/2019   HGBA1C 6.5 04/07/2020         Assessment & Plan:

## 2020-04-07 NOTE — Patient Instructions (Addendum)
Please remember to have your booster shot soon, such as going to to [email or web address] to get signed up for a time to get the shot  Please take all new medication as prescribed - the pain medication as needed, and the prednisone for the right shoulder  Please go to the XRAY Department in the first floor for the x-ray testing  If you are not better in 2-3 wks, please consider seeing Sports Medicine on the first floor to be checked there as well  Your ears were cleared of wax today  Please continue all other medications as before, and refills have been done if requested.  Please have the pharmacy call with any other refills you may need.  Please continue your efforts at being more active, low cholesterol diet, and weight control  Please keep your appointments with your specialists as you may have planned  Please go to the LAB at the blood drawing area for the tests to be done - the urine testing and cholesterol this time  You will be contacted by phone if any changes need to be made immediately.  Otherwise, you will receive a letter about your results with an explanation, but please check with MyChart first.  Please remember to sign up for MyChart if you have not done so, as this will be important to you in the future with finding out test results, communicating by private email, and scheduling acute appointments online when needed.  Please make an Appointment to return in 6 months, or sooner if needed

## 2020-04-10 ENCOUNTER — Encounter: Payer: Self-pay | Admitting: Internal Medicine

## 2020-04-10 NOTE — Assessment & Plan Note (Addendum)
?   Rot cuff disease, for xray, tramadol prn, predpac as,  to f/u any worsening symptoms or concerns  I spent 41 minutes in preparing to see the patient by review of recent labs, imaging and procedures, obtaining and reviewing separately obtained history, communicating with the patient and family or caregiver, ordering medications, tests or procedures, and documenting clinical information in the EHR including the differential Dx, treatment, and any further evaluation and other management of right shoulder pain , bilat hearing loss, aortic atherosclerosis, htn, hld, hyperglycemia

## 2020-04-10 NOTE — Assessment & Plan Note (Signed)
To contin statin and cardiac risk factor modification

## 2020-04-10 NOTE — Assessment & Plan Note (Signed)
stable overall by history and exam, recent data reviewed with pt, and pt to continue medical treatment as before,  to f/u any worsening symptoms or concerns  

## 2020-04-10 NOTE — Assessment & Plan Note (Signed)
Improved with wax impactions resolved with irrigation

## 2020-04-10 NOTE — Assessment & Plan Note (Signed)
On new statin, for f/u lab

## 2020-04-25 ENCOUNTER — Telehealth: Payer: Self-pay | Admitting: Internal Medicine

## 2020-04-25 NOTE — Telephone Encounter (Signed)
hydrochlorothiazide (HYDRODIURIL) 25 MG tablet [place] [place] - [address] Phone:  [phone]  Fax:  [phone]     Patient states she found out that this medication was recalled and wanted to make sure, and if it is what is a medication she can take in place. Patient # [id number]

## 2020-04-26 NOTE — Telephone Encounter (Signed)
There was a recall on 12 lots only of avalide (irbesartan -HCT) but NOT hct  No need for any change at this time

## 2020-04-26 NOTE — Telephone Encounter (Signed)
Sent to Dr. John to advise. 

## 2020-05-03 DIAGNOSIS — H25813 Combined forms of age-related cataract, bilateral: Secondary | ICD-10-CM | POA: Diagnosis not present

## 2020-05-30 NOTE — Progress Notes (Signed)
Subjective:    CC: R shoulder pain  I, Molly Weber, LAT, ATC, am serving as scribe for Dr. [name].  HPI: Pt is a 74 y/o female presenting w/ c/o R shoulder pain x about a month.  She locates her pain to anterior, but all over shoulder region.  She was prescribed prednisone which she took which helped a little.  She was also prescribed a muscle relaxer which made her feel dizzy and fatigued the next day and she stopped taking.  Radiating pain: down arm sometimes Aggravating factors: increased pain at night Treatments tried: Prednisone dose pack; Tramadol  Diagnostic testing: R shoulder XR- 04/07/20  Pertinent review of Systems: No fevers or chills  Relevant historical information: Sarcoidosis, osteopenia, history cervical radiculopathy   Objective:    Vitals:   05/31/20 0918  BP: 118/76  Pulse: 89  SpO2: 99%   General: Well Developed, well nourished, and in no acute distress.   MSK: C-spine normal-appearing nontender midline.  Mildly tender to palpation right trapezius. Normal cervical motion. Upper extremity strength reflexes and sensation are equal normal throughout. Right shoulder normal-appearing Nontender. Normal motion some pain with abduction. Positive Hawkins and Neer's test.  Mildly positive empty can test.  Negative Yergason's and speeds test. Intact strength.  Lab and Radiology Results DG Shoulder Right  Result Date: 04/07/2020 CLINICAL DATA:  Fall, pain EXAM: RIGHT SHOULDER - 2+ VIEW COMPARISON:  None. FINDINGS: There is no evidence of fracture or dislocation. There is no evidence of arthropathy or other focal bone abnormality. Soft tissues are unremarkable. IMPRESSION: Negative. Electronically Signed   By: Macy Mis M.D.   On: 04/07/2020 16:10   I, [name], personally (independently) visualized and performed the interpretation of the images attached in this note.  Diagnostic Limited MSK Ultrasound of: Right shoulder Biceps tendon intact  normal-appearing Subscapularis tendon is intact and normal appearing Supraspinatus tendon intact however some hyper and hypoechoic changes at distal tendon insertion.  This could represent chronic tendinopathy versus small nonretracted tear. Moderate subacromial bursitis is present. Infraspinatus tendon some hyperechoic change distal tendon insertion indicating chronic calcific tendinopathy. AC joint with effusion without severe degeneration Impression: Subacromial bursitis, rotator cuff tendinopathy, possible nonretracted partial supraspinatus tear    Impression and Recommendations:    Assessment and Plan: 74 y.o. female with right shoulder pain after partial fall.  Most likely diagnosis at this point is rotator cuff tendinopathy with a possible partial supraspinatus tear without significant retraction.  Fortunately she is moving pretty well.  Plan for trial of physical therapy.  Also recommend Voltaren gel.  Check back in 6 weeks.  If not better would consider injection versus MRI.Marland Kitchen  PDMP not reviewed this encounter. Orders Placed This Encounter  Procedures  . [name] LIMITED JOINT SPACE STRUCTURES UP RIGHT(NO LINKED CHARGES)    Standing Status:   Future    Number of Occurrences:   1    Standing Expiration Date:   11/28/2020    Order Specific Question:   Reason for Exam (SYMPTOM  OR DIAGNOSIS REQUIRED)    Answer:   chronic right shoulder pain    Order Specific Question:   Preferred imaging location?    Answer:   [place]  . Ambulatory referral to Physical Therapy    Referral Priority:   Routine    Referral Type:   Physical Medicine    Referral Reason:   Specialty Services Required    Requested Specialty:   Physical Therapy   No  orders of the defined types were placed in this encounter.   Discussed warning signs or symptoms. Please see discharge instructions. Patient expresses understanding.   The above documentation has been reviewed and is accurate and  complete [name], M.D.

## 2020-05-31 ENCOUNTER — Ambulatory Visit: Payer: Self-pay

## 2020-05-31 ENCOUNTER — Other Ambulatory Visit: Payer: Self-pay

## 2020-05-31 ENCOUNTER — Ambulatory Visit (INDEPENDENT_AMBULATORY_CARE_PROVIDER_SITE_OTHER): Payer: Medicare Other | Admitting: Family Medicine

## 2020-05-31 VITALS — BP 118/76 | HR 89 | Wt 138.8 lb

## 2020-05-31 DIAGNOSIS — M25511 Pain in right shoulder: Secondary | ICD-10-CM

## 2020-05-31 DIAGNOSIS — G8929 Other chronic pain: Secondary | ICD-10-CM

## 2020-05-31 NOTE — Patient Instructions (Addendum)
Thank you for coming in today.  I've referred you to Physical Therapy.  Let us know if you don't hear from them in one week.  Try using Voltaren gel on the shoulder.  Please use voltaren gel up to 4x daily for pain as needed.  The ingredient is Diclofenac gel.   Recheck with me in about 6 weeks.  Let me know sooner if this is not working.

## 2020-06-15 ENCOUNTER — Encounter: Payer: Self-pay | Admitting: Physical Therapy

## 2020-06-15 ENCOUNTER — Other Ambulatory Visit: Payer: Self-pay

## 2020-06-15 ENCOUNTER — Ambulatory Visit (INDEPENDENT_AMBULATORY_CARE_PROVIDER_SITE_OTHER): Payer: Medicare Other | Admitting: Physical Therapy

## 2020-06-15 DIAGNOSIS — M6281 Muscle weakness (generalized): Secondary | ICD-10-CM

## 2020-06-15 DIAGNOSIS — R293 Abnormal posture: Secondary | ICD-10-CM

## 2020-06-15 DIAGNOSIS — M25511 Pain in right shoulder: Secondary | ICD-10-CM

## 2020-06-15 NOTE — Therapy (Signed)
[facility] Physical Therapy [address] [place], [place], [address] Phone: [phone]   Fax:  [phone]  Physical Therapy Evaluation  Patient Details  Name: Briana Schroeder MRN: [id number] Date of Birth: [birth year] Referring Provider (PT): Gregor Hams, MD   Encounter Date: 06/15/2020   PT End of Session - 06/15/20 1122    Visit Number 1    Number of Visits 12    Date for PT Re-Evaluation 07/27/20    Authorization Type Medicare/AARP    Progress Note Due on Visit 10    PT Start Time 0802    PT Stop Time 0835    PT Time Calculation (min) 33 min    Activity Tolerance Patient tolerated treatment well    Behavior During Therapy [facility] for tasks assessed/performed           Past Medical History:  Diagnosis Date   ALLERGIC RHINITIS 08/02/2009   Allergy    ANXIETY 10/18/2008   ARM PAIN, RIGHT 04/04/2009   BACK PAIN 08/02/2009   CHEST PAIN 12/05/2009   COLONIC POLYPS, HX OF 10/18/2008   DIZZINESS 12/05/2009   EPIGASTRIC PAIN 01/09/2010   FATIGUE 09/06/2009   GERD 12/06/2009   HOARSENESS 09/06/2009   HYPERLIPIDEMIA 10/18/2008   HYPERTENSION 10/18/2008   IBS 10/18/2008   MYALGIA 03/21/2009   Osteopenia 12/2018   T score -1.7 FRAX 12% / 2.5% table from prior DEXA 2018   PULMONARY SARCOIDOSIS 10/18/2008   SINUSITIS- ACUTE-NOS 03/21/2009   VOCAL CORD NODULE 09/06/2009    Past Surgical History:  Procedure Laterality Date   ABDOMINAL HYSTERECTOMY  08/1992   TAH-DR G FOR FIBROIDS AND MENORRHAGIA   COLONOSCOPY  11-24-2009   3 polyps, +TA and HPP   hx of vocal cord nodules     s/p right finger tendon surgury     UPPER GASTROINTESTINAL ENDOSCOPY  01-08-2010   normal    There were no vitals filed for this visit.    Subjective Assessment - 06/15/20 0807    Subjective Pt is a 74 y/o female who presents to OPPT for Rt shoulder pain x a couple months without known injury except pt reports having a fall around that time but doesn't recall injuring  her shoulder at that time.  Pain at this time is worse at night affecting her sleep mostly.    Pertinent History anxiety, HTN, osteopenia, pulmonary sarcoidosis    Limitations Lifting;House hold activities    Diagnostic tests U/S:Subacromial bursitis, rotator cuff tendinopathy, possible nonretracted partial supraspinatus tear    Patient Stated Goals improve pain    Currently in Pain? Yes    Pain Score 1    up to 10/10, at best 1/10   Pain Location Shoulder    Pain Orientation Right    Pain Descriptors / Indicators Aching;Dull    Pain Type Acute pain    Pain Onset More than a month ago    Pain Frequency Intermittent    Aggravating Factors  painful arc with reaching, worse at night    Pain Relieving Factors meds    Effect of Pain on Daily Activities difficulty sleeping              [facility] PT Assessment - 06/15/20 0811      Assessment   Medical Diagnosis M25.511,G89.29 (ICD-10-CM) - Chronic right shoulder pain    Referring Provider (PT) Gregor Hams, MD    Onset Date/Surgical Date --   2-3 months ago   Hand Dominance Right    Next  MD Visit 07/12/20    Prior Therapy none recently      Precautions   Precautions None      Restrictions   Weight Bearing Restrictions No      Balance Screen   Has the patient fallen in the past 6 months Yes    How many times? 2    Has the patient had a decrease in activity level because of a fear of falling?  No    Is the patient reluctant to leave their home because of a fear of falling?  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    Leisure walking - tries to walk everyday      Cognition   Overall Cognitive Status Within Functional Limits for tasks assessed      Observation/Other Assessments   Focus on Therapeutic  Outcomes (FOTO)  64 (predicted 72)      Posture/Postural Control   Posture/Postural Control Postural limitations    Postural Limitations Rounded Shoulders;Forward head      ROM / Strength   AROM / PROM / Strength AROM;Strength      AROM   Overall AROM Comments bil shoulders WNL- Rt shoulder pain with all motions      Strength   Overall Strength Comments pain with ER, flexion, abduction    Strength Assessment Site Shoulder    Right/Left Shoulder Right;Left    Right Shoulder Flexion 3+/5    Right Shoulder ABduction 3+/5    Right Shoulder Internal Rotation 5/5    Right Shoulder External Rotation 4/5    Left Shoulder Flexion 4/5    Left Shoulder ABduction 4/5    Left Shoulder Internal Rotation 5/5    Left Shoulder External Rotation 5/5      Palpation   Palpation comment tenderness along supraspinatus and middle deltoid on Rt      Special Tests    Special Tests Rotator Cuff Impingement    Rotator Cuff Impingment tests Michel Bickers test;Empty Can test;Full Can test;Painful Arc of Motion;Speed's test      Hawkins-Kennedy test   Findings Positive    Side Right      Empty Can test   Findings Positive    Side Right    Comment pain and weakness      Full Can test   Findings Positive    Side Right    Comment pain and weakness      Speed's test   Findings Positive    Side Right      Painful Arc of Motion   Findings Positive    Side Right                      Objective measurements completed on examination: See above findings.       [facility] Adult PT Treatment/Exercise - 06/15/20 0811      Exercises   Exercises Shoulder      Shoulder Exercises: Seated   Retraction 5 reps    External Rotation 5 reps    Other Seated Exercises shoulder rolls backwards x 5 reps      Modalities   Modalities Iontophoresis      Iontophoresis   Type of Iontophoresis Dexamethasone  Location Rt shoulder    Dose 1 cc    Time 6 hour patch                   PT Education - 06/15/20 1121    Education Details HEP, ionto    Person(s) Educated Patient    Methods Demonstration;Explanation;Handout    Comprehension Verbalized understanding;Returned demonstration;Need further instruction            PT Short Term Goals - 06/15/20 1126      PT SHORT TERM GOAL #1   Title independent with initial HEP    Status New    Target Date 06/29/20             PT Long Term Goals - 06/15/20 1126      PT LONG TERM GOAL #1   Title independent with final HEP    Status New    Target Date 07/27/20      PT LONG TERM GOAL #2   Title FOTO score improved to 72 for improved function    Status New    Target Date 07/27/20      PT LONG TERM GOAL #3   Title demonstrate 4/5 Rt shoulder strength without pain for improved function    Status New    Target Date 07/27/20      PT LONG TERM GOAL #4   Title report pain < 3/10 at night for improved sleep quality    Status New    Target Date 07/27/20                  Plan - 06/15/20 0829    Clinical Impression Statement Pt is a 74 y/o female who presents to OPPT for Rt shoulder pain consistent with RTC tendinopathy.  She demonstrates postural abnormalities and decreased strength as well as pain affecting function. Will benefit from PT to address deficits listed.    Personal Factors and Comorbidities Comorbidity 3+    Comorbidities anxiety, HTN, osteopenia, pulmonary sarcoidosis    Examination-Activity Limitations Bed Mobility;Sleep;Lift;Reach Overhead    Examination-Participation Restrictions Community Activity;Occupation    Stability/Clinical Decision Making Evolving/Moderate complexity    Clinical Decision Making Moderate    Rehab Potential Good    PT Frequency 2x / week   1-2x/wk   PT Duration 6 weeks    PT Treatment/Interventions ADLs/Self Care Home Management;Cryotherapy;Electrical Stimulation;Iontophoresis 4mg /ml Dexamethasone;Moist Heat;Therapeutic exercise;Therapeutic activities;Functional  mobility training;Ultrasound;Patient/family education;Manual techniques;Taping;Dry needling    PT Next Visit Plan review HEP, ionto #2 if appropriate, assess response to ionto; strengthening with pain  minimized    PT Home Exercise Plan Access Code: [email or web address]    Consulted and Agree with Plan of Care Patient           Patient will benefit from skilled therapeutic intervention in order to improve the following deficits and impairments:  Impaired UE functional use,Pain,Decreased strength,Postural dysfunction  Visit Diagnosis: Acute pain of right shoulder - Plan: PT plan of care cert/re-cert  Abnormal posture - Plan: PT plan of care cert/re-cert  Muscle weakness (generalized) - Plan: PT plan of care cert/re-cert     Problem List Patient Active Problem List   Diagnosis Date Noted   Aortic atherosclerosis ([place]) 04/07/2020   Right shoulder pain 04/07/2020   Lipoma of right thigh 10/08/2019   Foot cramps 10/08/2019   Rectocele 09/29/2019   Weight loss 09/05/2018   Hyperglycemia 06/17/2018   Left sided sciatica 06/27/2017   Left sided chest pain 05/15/2017   Radiculitis of left  cervical region 10/10/2016   Abnormal auditory perception of left ear 03/08/2016   Bilateral impacted cerumen 03/08/2016   Hearing loss in right ear 12/15/2014   Elevated CK 12/02/2014   Bilateral hearing loss 05/21/2013   Hypokalemia 11/17/2012   Paresthesia 05/19/2012   Abnormal LFTs (liver function tests) 05/19/2012   Hearing loss, bilateral 05/19/2012   Low back pain 07/13/2011   Preventative health care 05/15/2011   Left shoulder pain 05/15/2011   Osteopenia    Fibroid    Hearing loss sensory, bilateral 02/27/2011   EPIGASTRIC PAIN 01/09/2010   GERD 12/06/2009   DIZZINESS 12/05/2009   Hyperlipidemia 09/06/2009   FATIGUE 09/06/2009   HOARSENESS 09/06/2009   Allergic rhinitis 08/02/2009   PULMONARY SARCOIDOSIS 10/18/2008   Anxiety state 10/18/2008    Essential hypertension 10/18/2008   IBS 10/18/2008   History of colonic polyps 10/18/2008      Laureen Abrahams, PT, DPT 06/15/20 11:30 AM     [facility] Physical Therapy [address] [place], [place], [address] Phone: [phone]   Fax:  [phone]  Name: LUREE PALLA MRN: [id number] Date of Birth: 1946/04/29

## 2020-06-15 NOTE — Patient Instructions (Signed)
Access Code: [email or web address] URL: [email or web address] Date: 06/15/2020 Prepared by: Faustino Congress  Exercises Standing Backward Shoulder Rolls - 2 x daily - 7 x weekly - 10 reps - 1 sets Seated Scapular Retraction - 2 x daily - 7 x weekly - 10 reps - 1 sets - 5 sec hold Shoulder External Rotation and Scapular Retraction - 1 x daily - 7 x weekly - 10 reps - 1 sets - 1-2 sec hold  Patient Education Ionto Patient Instructions

## 2020-06-28 ENCOUNTER — Ambulatory Visit (INDEPENDENT_AMBULATORY_CARE_PROVIDER_SITE_OTHER): Payer: Medicare Other | Admitting: Physical Therapy

## 2020-06-28 ENCOUNTER — Other Ambulatory Visit: Payer: Self-pay

## 2020-06-28 ENCOUNTER — Encounter: Payer: Self-pay | Admitting: Physical Therapy

## 2020-06-28 DIAGNOSIS — M6281 Muscle weakness (generalized): Secondary | ICD-10-CM | POA: Diagnosis not present

## 2020-06-28 DIAGNOSIS — R293 Abnormal posture: Secondary | ICD-10-CM | POA: Diagnosis not present

## 2020-06-28 DIAGNOSIS — M25511 Pain in right shoulder: Secondary | ICD-10-CM | POA: Diagnosis not present

## 2020-06-28 NOTE — Therapy (Signed)
[facility] Physical Therapy [address] [place], [place], [address] Phone: [phone]   Fax:  [phone]  Physical Therapy Treatment  Patient Details  Name: Briana Schroeder MRN: [id number] Date of Birth: 1945/11/15 Referring Provider (PT): Gregor Hams, MD   Encounter Date: 06/28/2020   PT End of Session - 06/28/20 0926    Visit Number 2    Number of Visits 12    Date for PT Re-Evaluation 07/27/20    Authorization Type Medicare/AARP    Progress Note Due on Visit 10    PT Start Time 0844    PT Stop Time 0925    PT Time Calculation (min) 41 min    Activity Tolerance Patient tolerated treatment well    Behavior During Therapy [facility] for tasks assessed/performed           Past Medical History:  Diagnosis Date  . ALLERGIC RHINITIS 08/02/2009  . Allergy   . ANXIETY 10/18/2008  . ARM PAIN, RIGHT 04/04/2009  . BACK PAIN 08/02/2009  . CHEST PAIN 12/05/2009  . COLONIC POLYPS, HX OF 10/18/2008  . DIZZINESS 12/05/2009  . EPIGASTRIC PAIN 01/09/2010  . FATIGUE 09/06/2009  . GERD 12/06/2009  . HOARSENESS 09/06/2009  . HYPERLIPIDEMIA 10/18/2008  . HYPERTENSION 10/18/2008  . IBS 10/18/2008  . MYALGIA 03/21/2009  . Osteopenia 12/2018   T score -1.7 FRAX 12% / 2.5% table from prior DEXA 2018  . PULMONARY SARCOIDOSIS 10/18/2008  . SINUSITIS- ACUTE-NOS 03/21/2009  . VOCAL CORD NODULE 09/06/2009    Past Surgical History:  Procedure Laterality Date  . ABDOMINAL HYSTERECTOMY  08/1992   TAH-DR G FOR FIBROIDS AND MENORRHAGIA  . COLONOSCOPY  11-24-2009   3 polyps, +TA and HPP  . hx of vocal cord nodules    . s/p right finger tendon surgury    . UPPER GASTROINTESTINAL ENDOSCOPY  01-08-2010   normal    There were no vitals filed for this visit.   Subjective Assessment - 06/28/20 0841    Subjective shoulder still hurts, worse at night with lying down.  during the day the pain is better.  lost papers for her exercises- but trying to do what she could remember.    Pertinent History  anxiety, HTN, osteopenia, pulmonary sarcoidosis    Limitations Lifting;House hold activities    Diagnostic tests U/S:Subacromial bursitis, rotator cuff tendinopathy, possible nonretracted partial supraspinatus tear    Patient Stated Goals improve pain    Currently in Pain? Yes    Pain Score 3    up to 9-10/10 at night   Pain Location Shoulder    Pain Orientation Right    Pain Descriptors / Indicators Aching;Dull    Pain Type Acute pain    Pain Onset More than a month ago    Pain Frequency Intermittent    Aggravating Factors  painful arc with reaching, worse at night    Pain Relieving Factors meds                             [facility] Adult PT Treatment/Exercise - 06/28/20 0850      Shoulder Exercises: Supine   Flexion Right;20 reps;Weights    Shoulder Flexion Weight (lbs) 1      Shoulder Exercises: Seated   Retraction 20 reps    Retraction Limitations 5 sec hold    External Rotation 20 reps   with scap retraction, 5 sec hold   Other Seated Exercises shoulder rolls backward x  20 reps      Shoulder Exercises: Sidelying   External Rotation Right;20 reps;Weights    External Rotation Weight (lbs) 1    ABduction Right;20 reps;Weights    ABduction Weight (lbs) 1      Shoulder Exercises: Standing   External Rotation Both;20 reps;Theraband    Theraband Level (Shoulder External Rotation) Level 2 (Red)    Internal Rotation Right;20 reps;Theraband    Theraband Level (Shoulder Internal Rotation) Level 2 (Red)    Row Both;20 reps;Theraband    Theraband Level (Shoulder Row) Level 2 (Red)      Shoulder Exercises: ROM/Strengthening   UBE (Upper Arm Bike) L1.5 x 6 min (3' each direction)    Proximal Shoulder Strengthening, Supine Rt; 2# circles CW/CCW x 20 reps each      Iontophoresis   Type of Iontophoresis Dexamethasone    Location Rt shoulder    Dose 1 cc    Time 6 hour patch                    PT Short Term Goals - 06/28/20 0926      PT SHORT TERM  GOAL #1   Title independent with initial HEP    Baseline 12/28: lost papers, min cues needed today    Status Partially Met    Target Date 06/29/20             PT Long Term Goals - 06/28/20 0926      PT LONG TERM GOAL #1   Title independent with final HEP    Status On-going    Target Date 07/27/20      PT LONG TERM GOAL #2   Title FOTO score improved to 72 for improved function    Status On-going      PT LONG TERM GOAL #3   Title demonstrate 4/5 Rt shoulder strength without pain for improved function    Status On-going      PT LONG TERM GOAL #4   Title report pain < 3/10 at night for improved sleep quality    Status On-going                 Plan - 06/28/20 0926    Clinical Impression Statement Pt tolerated session well today with focus on light strengthening exercises.  Ionto #2 applied today.  Will continue to benefit from PT to maximize function.  All goals ongoing at this time.    Personal Factors and Comorbidities Comorbidity 3+    Comorbidities anxiety, HTN, osteopenia, pulmonary sarcoidosis    Examination-Activity Limitations Bed Mobility;Sleep;Lift;Reach Overhead    Examination-Participation Restrictions Community Activity;Occupation    Stability/Clinical Decision Making Evolving/Moderate complexity    Rehab Potential Good    PT Frequency 2x / week   1-2x/wk   PT Duration 6 weeks    PT Treatment/Interventions ADLs/Self Care Home Management;Cryotherapy;Electrical Stimulation;Iontophoresis 59m/ml Dexamethasone;Moist Heat;Therapeutic exercise;Therapeutic activities;Functional mobility training;Ultrasound;Patient/family education;Manual techniques;Taping;Dry needling    PT Next Visit Plan ionto #3 if appropriate, assess response to ionto; strengthening with pain  minimized    PT Home Exercise Plan Access Code: [email or web address]   Consulted and Agree with Plan of Care Patient           Patient will benefit from skilled therapeutic intervention in order to improve  the following deficits and impairments:  Impaired UE functional use,Pain,Decreased strength,Postural dysfunction  Visit Diagnosis: Acute pain of right shoulder  Abnormal posture  Muscle weakness (generalized)     Problem List Patient Active  Problem List   Diagnosis Date Noted  . Aortic atherosclerosis ([place]) 04/07/2020  . Right shoulder pain 04/07/2020  . Lipoma of right thigh 10/08/2019  . Foot cramps 10/08/2019  . Rectocele 09/29/2019  . Weight loss 09/05/2018  . Hyperglycemia 06/17/2018  . Left sided sciatica 06/27/2017  . Left sided chest pain 05/15/2017  . Radiculitis of left cervical region 10/10/2016  . Abnormal auditory perception of left ear 03/08/2016  . Bilateral impacted cerumen 03/08/2016  . Hearing loss in right ear 12/15/2014  . Elevated CK 12/02/2014  . Bilateral hearing loss 05/21/2013  . Hypokalemia 11/17/2012  . Paresthesia 05/19/2012  . Abnormal LFTs (liver function tests) 05/19/2012  . Hearing loss, bilateral 05/19/2012  . Low back pain 07/13/2011  . Preventative health care 05/15/2011  . Left shoulder pain 05/15/2011  . Osteopenia   . Fibroid   . Hearing loss sensory, bilateral 02/27/2011  . EPIGASTRIC PAIN 01/09/2010  . GERD 12/06/2009  . DIZZINESS 12/05/2009  . Hyperlipidemia 09/06/2009  . FATIGUE 09/06/2009  . HOARSENESS 09/06/2009  . Allergic rhinitis 08/02/2009  . PULMONARY SARCOIDOSIS 10/18/2008  . Anxiety state 10/18/2008  . Essential hypertension 10/18/2008  . IBS 10/18/2008  . History of colonic polyps 10/18/2008      Laureen Abrahams, PT, DPT 06/28/20 9:29 AM    [facility] Physical Therapy [address] [place], [place], [address] Phone: [phone]   Fax:  [phone]  Name: Briana Schroeder MRN: [id number] Date of Birth: [birth year]

## 2020-06-30 ENCOUNTER — Encounter: Payer: Self-pay | Admitting: Physical Therapy

## 2020-06-30 ENCOUNTER — Ambulatory Visit (INDEPENDENT_AMBULATORY_CARE_PROVIDER_SITE_OTHER): Payer: Medicare Other | Admitting: Physical Therapy

## 2020-06-30 ENCOUNTER — Other Ambulatory Visit: Payer: Self-pay

## 2020-06-30 DIAGNOSIS — M25511 Pain in right shoulder: Secondary | ICD-10-CM | POA: Diagnosis not present

## 2020-06-30 DIAGNOSIS — M6281 Muscle weakness (generalized): Secondary | ICD-10-CM | POA: Diagnosis not present

## 2020-06-30 DIAGNOSIS — R293 Abnormal posture: Secondary | ICD-10-CM | POA: Diagnosis not present

## 2020-06-30 DIAGNOSIS — R600 Localized edema: Secondary | ICD-10-CM

## 2020-06-30 NOTE — Therapy (Signed)
[place], [place], [address] Phone: [phone]   Fax:  [phone]  Physical Therapy Treatment  Patient Details  Name: Briana Schroeder MRN: [id number] Date of Birth: [birth year] Referring Provider (PT): Gregor Hams, MD   Encounter Date: 06/30/2020   PT End of Session - 06/30/20 0929    Visit Number 3    Number of Visits 12    Date for PT Re-Evaluation 07/27/20    Authorization Type Medicare/AARP    Progress Note Due on Visit 10    PT Start Time 0845    PT Stop Time 0929    PT Time Calculation (min) 44 min    Activity Tolerance Patient tolerated treatment well    Behavior During Therapy [facility] for tasks assessed/performed           Past Medical History:  Diagnosis Date  . ALLERGIC RHINITIS 08/02/2009  . Allergy   . ANXIETY 10/18/2008  . ARM PAIN, RIGHT 04/04/2009  . BACK PAIN 08/02/2009  . CHEST PAIN 12/05/2009  . COLONIC POLYPS, HX OF 10/18/2008  . DIZZINESS 12/05/2009  . EPIGASTRIC PAIN 01/09/2010  . FATIGUE 09/06/2009  . GERD 12/06/2009  . HOARSENESS 09/06/2009  . HYPERLIPIDEMIA 10/18/2008  . HYPERTENSION 10/18/2008  . IBS 10/18/2008  . MYALGIA 03/21/2009  . Osteopenia 12/2018   T score -1.7 FRAX 12% / 2.5% table from prior DEXA 2018  . PULMONARY SARCOIDOSIS 10/18/2008  . SINUSITIS- ACUTE-NOS 03/21/2009  . VOCAL CORD NODULE 09/06/2009    Past Surgical History:  Procedure Laterality Date  . ABDOMINAL HYSTERECTOMY  08/1992   TAH-DR G FOR FIBROIDS AND MENORRHAGIA  . COLONOSCOPY  11-24-2009   3 polyps, +TA and HPP  . hx of vocal cord nodules    . s/p right finger tendon surgury    . UPPER GASTROINTESTINAL ENDOSCOPY  01-08-2010   normal    There were no vitals filed for this visit.   Subjective Assessment - 06/30/20 0848    Subjective she relays no pain upon arrival and not much during the day about 1-2, but at night when she lays down her pain is a 9 or 10    Pertinent History anxiety, HTN, osteopenia, pulmonary  sarcoidosis    Limitations Lifting;House hold activities    Diagnostic tests U/S:Subacromial bursitis, rotator cuff tendinopathy, possible nonretracted partial supraspinatus tear    Patient Stated Goals improve pain    Pain Onset More than a month ago              [facility] PT Assessment - 06/30/20 0001      Assessment   Medical Diagnosis M25.511,G89.29 (ICD-10-CM) - Chronic right shoulder pain    Referring Provider (PT) Gregor Hams, MD      AROM   Overall AROM Comments bil shoulders WNL- Rt shoulder pain with abd and ER      Strength   Right Shoulder Flexion 4/5    Right Shoulder ABduction 4/5    Right Shoulder Internal Rotation 5/5    Right Shoulder External Rotation 4/5            [facility] Adult PT Treatment/Exercise - 06/30/20 0001      Shoulder Exercises: Supine   Flexion Both      Shoulder Exercises: Standing   External Rotation Both;20 reps;Theraband    Theraband Level (Shoulder External Rotation) Level 2 (Red)    Internal Rotation Right;20 reps;Theraband    Theraband Level (Shoulder Internal Rotation) Level 2 (Red)  Extension Both;20 reps    Theraband Level (Shoulder Extension) Level 2 (Red)    Row Both;20 reps;Theraband    Theraband Level (Shoulder Row) Level 2 (Red)    Other Standing Exercises shoulder stability ball rolls at 90 deg flexion moving into up/down, lateral, cirlcles X 10 ea    Other Standing Exercises UE ranger circles  X15 ea      Shoulder Exercises: ROM/Strengthening   UBE (Upper Arm Bike) L2 x 6 min (3' each direction)      Shoulder Exercises: Stretch   Corner Stretch Limitations doorway/pec stretch at 90 deg into ER 20 sec X 3    Cross Chest Stretch 5 reps;10 seconds      Modalities   Modalities Iontophoresis;Vasopneumatic      Iontophoresis   Type of Iontophoresis Dexamethasone    Location Rt shoulder    Dose 1 cc    Time 6 hour patch      Vasopneumatic   Number Minutes Vasopneumatic  10 minutes    Vasopnuematic Location   Shoulder    Vasopneumatic Pressure Medium    Vasopneumatic Temperature  34                    PT Short Term Goals - 06/28/20 0926      PT SHORT TERM GOAL #1   Title independent with initial HEP    Baseline 12/28: lost papers, min cues needed today    Status Partially Met    Target Date 06/29/20             PT Long Term Goals - 06/28/20 0926      PT LONG TERM GOAL #1   Title independent with final HEP    Status On-going    Target Date 07/27/20      PT LONG TERM GOAL #2   Title FOTO score improved to 72 for improved function    Status On-going      PT LONG TERM GOAL #3   Title demonstrate 4/5 Rt shoulder strength without pain for improved function    Status On-going      PT LONG TERM GOAL #4   Title report pain < 3/10 at night for improved sleep quality    Status On-going                 Plan - 06/30/20 0931    Clinical Impression Statement Continued to address her impairments in strength and scapular stability in Rt shoulder. Used vaso today in efforts to reduce pain and overall inflammation along with Ionto # 3 today. Continue POC    Personal Factors and Comorbidities Comorbidity 3+    Comorbidities anxiety, HTN, osteopenia, pulmonary sarcoidosis    Examination-Activity Limitations Bed Mobility;Sleep;Lift;Reach Overhead    Examination-Participation Restrictions Community Activity;Occupation    Stability/Clinical Decision Making Evolving/Moderate complexity    Rehab Potential Good    PT Frequency 2x / week   1-2x/wk   PT Duration 6 weeks    PT Treatment/Interventions ADLs/Self Care Home Management;Cryotherapy;Electrical Stimulation;Iontophoresis 67m/ml Dexamethasone;Moist Heat;Therapeutic exercise;Therapeutic activities;Functional mobility training;Ultrasound;Patient/family education;Manual techniques;Taping;Dry needling    PT Next Visit Plan ionto #4 if appropriate, assess response to ionto; strengthening with pain  minimized    PT Home Exercise  Plan Access Code: [email or web address]   Consulted and Agree with Plan of Care Patient           Patient will benefit from skilled therapeutic intervention in order to improve the following deficits and impairments:  Impaired  UE functional use,Pain,Decreased strength,Postural dysfunction  Visit Diagnosis: Acute pain of right shoulder  Abnormal posture  Muscle weakness (generalized)  Localized edema     Problem List Patient Active Problem List   Diagnosis Date Noted  . Aortic atherosclerosis ([place]) 04/07/2020  . Right shoulder pain 04/07/2020  . Lipoma of right thigh 10/08/2019  . Foot cramps 10/08/2019  . Rectocele 09/29/2019  . Weight loss 09/05/2018  . Hyperglycemia 06/17/2018  . Left sided sciatica 06/27/2017  . Left sided chest pain 05/15/2017  . Radiculitis of left cervical region 10/10/2016  . Abnormal auditory perception of left ear 03/08/2016  . Bilateral impacted cerumen 03/08/2016  . Hearing loss in right ear 12/15/2014  . Elevated CK 12/02/2014  . Bilateral hearing loss 05/21/2013  . Hypokalemia 11/17/2012  . Paresthesia 05/19/2012  . Abnormal LFTs (liver function tests) 05/19/2012  . Hearing loss, bilateral 05/19/2012  . Low back pain 07/13/2011  . Preventative health care 05/15/2011  . Left shoulder pain 05/15/2011  . Osteopenia   . Fibroid   . Hearing loss sensory, bilateral 02/27/2011  . EPIGASTRIC PAIN 01/09/2010  . GERD 12/06/2009  . DIZZINESS 12/05/2009  . Hyperlipidemia 09/06/2009  . FATIGUE 09/06/2009  . HOARSENESS 09/06/2009  . Allergic rhinitis 08/02/2009  . PULMONARY SARCOIDOSIS 10/18/2008  . Anxiety state 10/18/2008  . Essential hypertension 10/18/2008  . IBS 10/18/2008  . History of colonic polyps 10/18/2008    Silvestre Mesi 06/30/2020, 9:32 AM  [facility] Physical Therapy [address] [place], [place], [address] Phone: [phone]   Fax:  [phone]  Name: Briana Schroeder MRN: [id number] Date of  Birth: 1946/06/21

## 2020-07-05 ENCOUNTER — Encounter: Payer: Medicare Other | Admitting: Physical Therapy

## 2020-07-08 ENCOUNTER — Encounter: Payer: Self-pay | Admitting: Physical Therapy

## 2020-07-08 ENCOUNTER — Other Ambulatory Visit: Payer: Self-pay

## 2020-07-08 ENCOUNTER — Ambulatory Visit (INDEPENDENT_AMBULATORY_CARE_PROVIDER_SITE_OTHER): Payer: Medicare Other | Admitting: Physical Therapy

## 2020-07-08 DIAGNOSIS — M6281 Muscle weakness (generalized): Secondary | ICD-10-CM | POA: Diagnosis not present

## 2020-07-08 DIAGNOSIS — M25511 Pain in right shoulder: Secondary | ICD-10-CM | POA: Diagnosis not present

## 2020-07-08 DIAGNOSIS — R293 Abnormal posture: Secondary | ICD-10-CM | POA: Diagnosis not present

## 2020-07-08 DIAGNOSIS — R600 Localized edema: Secondary | ICD-10-CM | POA: Diagnosis not present

## 2020-07-08 NOTE — Therapy (Addendum)
[place], [place], [address] Phone: [phone]   Fax:  [phone]  Physical Therapy Treatment/Discharge Summary  Patient Details  Name: Briana Schroeder MRN: [id number] Date of Birth: [birth year] Referring Provider (PT): Gregor Hams, MD   Encounter Date: 07/08/2020   PT End of Session - 07/08/20 0922    Visit Number 4    Number of Visits 12    Date for PT Re-Evaluation 07/27/20    Authorization Type Medicare/AARP    Progress Note Due on Visit 10    PT Start Time 0843    PT Stop Time 0922    PT Time Calculation (min) 39 min    Activity Tolerance Patient tolerated treatment well    Behavior During Therapy [facility] for tasks assessed/performed           Past Medical History:  Diagnosis Date  . ALLERGIC RHINITIS 08/02/2009  . Allergy   . ANXIETY 10/18/2008  . ARM PAIN, RIGHT 04/04/2009  . BACK PAIN 08/02/2009  . CHEST PAIN 12/05/2009  . COLONIC POLYPS, HX OF 10/18/2008  . DIZZINESS 12/05/2009  . EPIGASTRIC PAIN 01/09/2010  . FATIGUE 09/06/2009  . GERD 12/06/2009  . HOARSENESS 09/06/2009  . HYPERLIPIDEMIA 10/18/2008  . HYPERTENSION 10/18/2008  . IBS 10/18/2008  . MYALGIA 03/21/2009  . Osteopenia 12/2018   T score -1.7 FRAX 12% / 2.5% table from prior DEXA 2018  . PULMONARY SARCOIDOSIS 10/18/2008  . SINUSITIS- ACUTE-NOS 03/21/2009  . VOCAL CORD NODULE 09/06/2009    Past Surgical History:  Procedure Laterality Date  . ABDOMINAL HYSTERECTOMY  08/1992   TAH-DR G FOR FIBROIDS AND MENORRHAGIA  . COLONOSCOPY  11-24-2009   3 polyps, +TA and HPP  . hx of vocal cord nodules    . s/p right finger tendon surgury    . UPPER GASTROINTESTINAL ENDOSCOPY  01-08-2010   normal    There were no vitals filed for this visit.   Subjective Assessment - 07/08/20 0845    Subjective reports daytime is fine, but at night pain is still bad - "it's a little better, but not much."    Pertinent History anxiety, HTN, osteopenia, pulmonary sarcoidosis     Limitations Lifting;House hold activities    Diagnostic tests U/S:Subacromial bursitis, rotator cuff tendinopathy, possible nonretracted partial supraspinatus tear    Patient Stated Goals improve pain    Currently in Pain? Yes    Pain Score 3     Pain Location Shoulder    Pain Orientation Right    Pain Descriptors / Indicators Aching;Dull    Pain Type Acute pain    Pain Onset More than a month ago    Pain Frequency Intermittent    Aggravating Factors  worse at night    Pain Relieving Factors medication              [facility] PT Assessment - 07/08/20 0858      Assessment   Medical Diagnosis M25.511,G89.29 (ICD-10-CM) - Chronic right shoulder pain    Referring Provider (PT) Gregor Hams, MD      Observation/Other Assessments   Focus on Therapeutic Outcomes (FOTO)  67      Strength   Overall Strength Comments pain with flexion, abduction    Right Shoulder Flexion 3+/5    Right Shoulder ABduction 3+/5    Right Shoulder Internal Rotation 5/5    Right Shoulder External Rotation 4/5  [facility] Adult PT Treatment/Exercise - 07/08/20 0847      Shoulder Exercises: Standing   External Rotation Right;Theraband   3x10   Theraband Level (Shoulder External Rotation) Level 2 (Red)    Internal Rotation Right;Theraband   3x10   Theraband Level (Shoulder Internal Rotation) Level 4 (Blue)    Flexion Right;Weights   3x10   Shoulder Flexion Weight (lbs) 1    ABduction Right;Weights   3x10   Shoulder ABduction Weight (lbs) 1    Extension Both;Theraband   3x10   Theraband Level (Shoulder Extension) Level 4 (Blue)    Row Both;Theraband   3x10   Theraband Level (Shoulder Row) Level 4 (Blue)      Shoulder Exercises: ROM/Strengthening   UBE (Upper Arm Bike) L2.5 x 6 min (3' each direction)                    PT Short Term Goals - 06/28/20 0926      PT SHORT TERM GOAL #1   Title independent with initial HEP    Baseline 12/28: lost papers, min  cues needed today    Status Partially Met    Target Date 06/29/20             PT Long Term Goals - 06/28/20 0926      PT LONG TERM GOAL #1   Title independent with final HEP    Status On-going    Target Date 07/27/20      PT LONG TERM GOAL #2   Title FOTO score improved to 72 for improved function    Status On-going      PT LONG TERM GOAL #3   Title demonstrate 4/5 Rt shoulder strength without pain for improved function    Status On-going      PT LONG TERM GOAL #4   Title report pain < 3/10 at night for improved sleep quality    Status On-going                 Plan - 07/08/20 4174    Clinical Impression Statement FOTO score improved 3% to date, and overall functional mobility is pretty good.  Her biggest issue at this time is persistent elevated pain (rated 9/10) at night.  She's scheduled to see the MD next week, so will plan to follow up after to determine next steps.    Personal Factors and Comorbidities Comorbidity 3+    Comorbidities anxiety, HTN, osteopenia, pulmonary sarcoidosis    Examination-Activity Limitations Bed Mobility;Sleep;Lift;Reach Overhead    Examination-Participation Restrictions Community Activity;Occupation    Stability/Clinical Decision Making Evolving/Moderate complexity    Rehab Potential Good    PT Frequency 2x / week   1-2x/wk   PT Duration 6 weeks    PT Treatment/Interventions ADLs/Self Care Home Management;Cryotherapy;Electrical Stimulation;Iontophoresis 13m/ml Dexamethasone;Moist Heat;Therapeutic exercise;Therapeutic activities;Functional mobility training;Ultrasound;Patient/family education;Manual techniques;Taping;Dry needling    PT Next Visit Plan see what MD says    PT Home Exercise Plan Access Code: [email or web address]   Consulted and Agree with Plan of Care Patient           Patient will benefit from skilled therapeutic intervention in order to improve the following deficits and impairments:  Impaired UE functional use,Pain,Decreased  strength,Postural dysfunction  Visit Diagnosis: Acute pain of right shoulder  Abnormal posture  Muscle weakness (generalized)  Localized edema     Problem List Patient Active Problem List   Diagnosis Date Noted  . Aortic atherosclerosis ([place] 04/07/2020  . Right shoulder  pain 04/07/2020  . Lipoma of right thigh 10/08/2019  . Foot cramps 10/08/2019  . Rectocele 09/29/2019  . Weight loss 09/05/2018  . Hyperglycemia 06/17/2018  . Left sided sciatica 06/27/2017  . Left sided chest pain 05/15/2017  . Radiculitis of left cervical region 10/10/2016  . Abnormal auditory perception of left ear 03/08/2016  . Bilateral impacted cerumen 03/08/2016  . Hearing loss in right ear 12/15/2014  . Elevated CK 12/02/2014  . Bilateral hearing loss 05/21/2013  . Hypokalemia 11/17/2012  . Paresthesia 05/19/2012  . Abnormal LFTs (liver function tests) 05/19/2012  . Hearing loss, bilateral 05/19/2012  . Low back pain 07/13/2011  . Preventative health care 05/15/2011  . Left shoulder pain 05/15/2011  . Osteopenia   . Fibroid   . Hearing loss sensory, bilateral 02/27/2011  . EPIGASTRIC PAIN 01/09/2010  . GERD 12/06/2009  . DIZZINESS 12/05/2009  . Hyperlipidemia 09/06/2009  . FATIGUE 09/06/2009  . HOARSENESS 09/06/2009  . Allergic rhinitis 08/02/2009  . PULMONARY SARCOIDOSIS 10/18/2008  . Anxiety state 10/18/2008  . Essential hypertension 10/18/2008  . IBS 10/18/2008  . History of colonic polyps 10/18/2008     Laureen Abrahams, PT, DPT 07/08/20 9:24 AM    [facility] Physical Therapy [address] [place], [place], [address] Phone: [phone]   Fax:  [phone]  Name: Briana Schroeder MRN: [id number] Date of Birth: December 10, 1945    PHYSICAL THERAPY DISCHARGE SUMMARY  Visits from Start of Care: 4  Current functional level related to goals / functional outcomes: See above   Remaining deficits: See above   Education / Equipment: HEP   Plan: Patient agrees to discharge.  Patient goals were not met. Patient is being discharged due to a change in medical status.  ?????    Pt having MRI.  Laureen Abrahams, PT, DPT 08/18/20 11:33 AM  [facility] Physical Therapy [address] [place], [place], [address] Phone: [phone]   Fax:  [phone]

## 2020-07-11 ENCOUNTER — Encounter: Payer: Medicare Other | Admitting: Physical Therapy

## 2020-07-11 NOTE — Progress Notes (Signed)
   I, Wendy Poet, LAT, ATC, am serving as scribe for Dr. [name].  Briana Schroeder is a 75 y.o. female who presents to [facility] today for f/u of R shoulder pain.  She was last seen by Dr. Georgina Snell on 05/31/20 and was referred to PT of which she has completed 4 visits.  Since her last visit, pt reports continued R shoulder pain. Pt locates pain to all over [facility] joint and radiates into upper arm. Pt c/o increased pain at night.   Diagnostic imaging: R shoulder XR- 04/07/20   Pertinent review of systems: No fevers or chills  Relevant historical information: Hypertension   Exam:  BP 116/78 (BP Location: Right Arm, Patient Position: Sitting, Cuff Size: Normal)   Pulse (!) 101   Ht 5\' 2"  (1.575 m)   Wt 133 lb 9.6 oz (60.6 kg)   SpO2 98%   BMI 24.44 kg/m  General: Well Developed, well nourished, and in no acute distress.   MSK: Right shoulder normal. Nontender. Normal motion pain with abduction. Intact strength. Positive Hawkins and Neer's test.  Positive empty can test.    Lab and Radiology Results [name] LIMITED JOINT SPACE STRUCTURES UP RIGHT(NO LINKED CHARGES)  Result Date: 06/01/2020 Diagnostic Limited MSK Ultrasound of: Right shoulder Biceps tendon intact normal-appearing Subscapularis tendon is intact and normal appearing Supraspinatus tendon intact however some hyper and hypoechoic changes at distal tendon insertion.  This could represent chronic tendinopathy versus small nonretracted tear. Moderate subacromial bursitis is present. Infraspinatus tendon some hyperechoic change distal tendon insertion indicating chronic calcific tendinopathy. AC joint with effusion without severe degeneration Impression: Subacromial bursitis, rotator cuff tendinopathy, possible nonretracted partial supraspinatus tear  I, [name], personally (independently) visualized and performed the interpretation of the images attached in this note.    Assessment and  Plan: 75 y.o. female with right shoulder pain thought to be due to rotator cuff tendinopathy, bursitis, and possible tear.  Patient is failing to improve with physical therapy.  At this point we will proceed with next steps which would be either injection or MRI.  After discussion with patient we will proceed with MRI to further characterize cause of pain and for potential injection or surgical planning.  Recheck after MRI.   PDMP not reviewed this encounter. Orders Placed This Encounter  Procedures  . MR SHOULDER RIGHT WO CONTRAST    Standing Status:   Future    Standing Expiration Date:   07/12/2021    Order Specific Question:   What is the patient's sedation requirement?    Answer:   No Sedation    Order Specific Question:   Does the patient have a pacemaker or implanted devices?    Answer:   No    Order Specific Question:   Preferred imaging location?    Answer:   Product/process development scientist (table limit-350lbs)   No orders of the defined types were placed in this encounter.    Discussed warning signs or symptoms. Please see discharge instructions. Patient expresses understanding.   The above documentation has been reviewed and is accurate and complete [name], M.D.  Total encounter time 20 minutes including face-to-face time with the patient and, reviewing past medical record, and charting on the date of service.   Treatment plan and options.

## 2020-07-12 ENCOUNTER — Other Ambulatory Visit: Payer: Self-pay

## 2020-07-12 ENCOUNTER — Ambulatory Visit: Payer: Self-pay

## 2020-07-12 ENCOUNTER — Ambulatory Visit (INDEPENDENT_AMBULATORY_CARE_PROVIDER_SITE_OTHER): Payer: Medicare Other | Admitting: Family Medicine

## 2020-07-12 VITALS — BP 116/78 | HR 101 | Ht 62.0 in | Wt 133.6 lb

## 2020-07-12 DIAGNOSIS — G8929 Other chronic pain: Secondary | ICD-10-CM | POA: Diagnosis not present

## 2020-07-12 DIAGNOSIS — M25511 Pain in right shoulder: Secondary | ICD-10-CM

## 2020-07-12 NOTE — Patient Instructions (Signed)
Thank you for coming in today.  You should hear from MRI scheduling within 1 week. If you do not hear please let me know.   Recheck following MRI.   Hold on more PT for now until after the MRI.

## 2020-07-13 ENCOUNTER — Encounter: Payer: Medicare Other | Admitting: Physical Therapy

## 2020-07-23 ENCOUNTER — Other Ambulatory Visit: Payer: Self-pay | Admitting: Internal Medicine

## 2020-07-23 ENCOUNTER — Ambulatory Visit (INDEPENDENT_AMBULATORY_CARE_PROVIDER_SITE_OTHER): Payer: Medicare Other

## 2020-07-23 ENCOUNTER — Other Ambulatory Visit: Payer: Self-pay

## 2020-07-23 DIAGNOSIS — M25511 Pain in right shoulder: Secondary | ICD-10-CM | POA: Diagnosis not present

## 2020-07-23 DIAGNOSIS — M75111 Incomplete rotator cuff tear or rupture of right shoulder, not specified as traumatic: Secondary | ICD-10-CM | POA: Diagnosis not present

## 2020-07-23 DIAGNOSIS — M7551 Bursitis of right shoulder: Secondary | ICD-10-CM

## 2020-07-23 DIAGNOSIS — G8929 Other chronic pain: Secondary | ICD-10-CM

## 2020-07-25 NOTE — Progress Notes (Signed)
Right shoulder MRI shows severe rotator cuff tendinitis with a rotator cuff tear.  Additionally possible labrum tear and some shoulder arthritis.  Recommend you return to clinic to discuss results in full detail.  This may require surgery.

## 2020-07-25 NOTE — Progress Notes (Signed)
I, Wendy Poet, LAT, ATC, am serving as scribe for Dr. [name].  Briana Schroeder is a 75 y.o. female who presents to [place] at [facility] today for f/u of R shoulder pain and R shoulder MRI review.  She was last seen by Dr. Georgina Snell on 07/12/20 and noted no change in her R shoulder pain despite having completed 4 PT sessions.  She was referred for a R shoulder MRI that she had on 07/23/20.  Since her last visit, pt reports R shoulder is still painful, especially at night. Pt locates pain to all over [facility] joint that radiates all down arm. Pt notes some numbness/tingling into hand, mostly at night.  Diagnostic testing: R shoulder MRI- 07/23/20; R shoulder XR- 04/07/20  Pertinent review of systems: No fevers or chills  Relevant historical information: History of prior cervical radiculopathy.  Exam:  BP 118/78 (BP Location: Left Arm, Patient Position: Sitting, Cuff Size: Normal)   Pulse 87   Ht 5\' 2"  (1.575 m)   Wt 133 lb (60.3 kg)   SpO2 97%   BMI 24.33 kg/m  General: Well Developed, well nourished, and in no acute distress.   MSK: Right shoulder normal-appearing Normal motion pain with abduction. Intact strength.    Lab and Radiology Results No results found for this or any previous visit (from the past 72 hour(s)). MR SHOULDER RIGHT WO CONTRAST  Result Date: 07/23/2020 CLINICAL DATA:  Chronic right shoulder pain EXAM: MRI OF THE RIGHT SHOULDER WITHOUT CONTRAST TECHNIQUE: Multiplanar, multisequence MR imaging of the shoulder was performed. No intravenous contrast was administered. COMPARISON:  04/07/2020 x-ray FINDINGS: Rotator cuff: Severe supraspinatus tendinosis with high-grade bursal sided tear of the mid to posterior portion of the distal tendon with up to 8 mm of tendinous retraction (series 5, images 9-12). Tear involves approximately 75% of the tendon depth. Moderate infraspinatus tendinosis without discrete tear. Subscapularis and teres minor tendons are  within normal limits. Muscles: Preserved bulk and signal intensity of the rotator cuff musculature without edema, atrophy, or fatty infiltration. Biceps long head:  Intact. Acromioclavicular Joint: Mild-moderate arthropathy of the AC joint. Small volume subacromial-subdeltoid bursal fluid. Glenohumeral Joint: No joint effusion. No chondral defect. Labrum: Intrasubstance T2 signal within the superior labrum just posterior to the level of the biceps anchor which could reflect a small tear (series 5, image 9). Labral evaluation is limited in the absence of intra-articular fluid/contrast. No paralabral cyst. Bones: No acute fracture. No dislocation. No bone marrow edema. No suspicious bone lesion. Other: None. IMPRESSION: 1. Severe supraspinatus tendinosis with high-grade bursal sided tear with mild retraction. 2. Moderate infraspinatus tendinosis without discrete tear. 3. Findings suspicious for small superior labral tear. 4. Mild-moderate AC joint osteoarthritis with mild subacromial-subdeltoid bursitis. Electronically Signed   By: Davina Poke D.O.   On: 07/23/2020 19:53   I, [name], personally (independently) visualized and performed the interpretation of the images attached in this note.  Procedure: Real-time Ultrasound Guided Injection of right subacromial bursa Device: Philips Affiniti 50G Images permanently stored and available for review in PACS Verbal informed consent obtained.  Discussed risks and benefits of procedure. Warned about infection bleeding damage to structures skin hypopigmentation and fat atrophy among others. Patient expresses understanding and agreement Time-out conducted.   Noted no overlying erythema, induration, or other signs of local infection.   Skin prepped in a sterile fashion.   Local anesthesia: Topical Ethyl chloride.   With sterile technique and under real time ultrasound guidance:  40 mg of Kenalog and 2 mL of Marcaine injected into subacromial bursa. Fluid  seen entering the bursa.   Completed without difficulty   Pain immediately resolved suggesting accurate placement of the medication.   Advised to call if fevers/chills, erythema, induration, drainage, or persistent bleeding.   Images permanently stored and available for review in the ultrasound unit.  Impression: Technically successful ultrasound guided injection.     Assessment and Plan: 75 y.o. female with right shoulder pain due to rotator cuff tendinopathy, bursitis, small bursal sided partial rotator cuff tear of the supraspinatus tendon. Patient has had trial of physical therapy. After discussion of MRI findings patient would like to try a steroid injection as she would like to avoid surgery if possible. Plan to proceed with injection and continued home exercise program. If this does not provide lasting benefit I would recommend at that point consultation with orthopedic surgery. She expresses understanding and agreement. Patient will keep me updated.   PDMP not reviewed this encounter. Orders Placed This Encounter  Procedures  . [name] LIMITED JOINT SPACE STRUCTURES UP RIGHT(NO LINKED CHARGES)    Standing Status:   Future    Number of Occurrences:   1    Standing Expiration Date:   01/23/2021    Order Specific Question:   Reason for Exam (SYMPTOM  OR DIAGNOSIS REQUIRED)    Answer:   chronic right shoulder pain    Order Specific Question:   Preferred imaging location?    Answer:   [place]   No orders of the defined types were placed in this encounter.    Discussed warning signs or symptoms. Please see discharge instructions. Patient expresses understanding.   The above documentation has been reviewed and is accurate and complete [name], M.D.

## 2020-07-26 ENCOUNTER — Encounter: Payer: Self-pay | Admitting: Family Medicine

## 2020-07-26 ENCOUNTER — Ambulatory Visit (INDEPENDENT_AMBULATORY_CARE_PROVIDER_SITE_OTHER): Payer: Medicare Other | Admitting: Family Medicine

## 2020-07-26 ENCOUNTER — Other Ambulatory Visit: Payer: Self-pay

## 2020-07-26 ENCOUNTER — Ambulatory Visit: Payer: Self-pay

## 2020-07-26 VITALS — BP 118/78 | HR 87 | Ht 62.0 in | Wt 133.0 lb

## 2020-07-26 DIAGNOSIS — S46011D Strain of muscle(s) and tendon(s) of the rotator cuff of right shoulder, subsequent encounter: Secondary | ICD-10-CM | POA: Diagnosis not present

## 2020-07-26 DIAGNOSIS — S46011A Strain of muscle(s) and tendon(s) of the rotator cuff of right shoulder, initial encounter: Secondary | ICD-10-CM | POA: Insufficient documentation

## 2020-07-26 DIAGNOSIS — M25511 Pain in right shoulder: Secondary | ICD-10-CM | POA: Diagnosis not present

## 2020-07-26 DIAGNOSIS — G8929 Other chronic pain: Secondary | ICD-10-CM

## 2020-07-26 MED ORDER — TRAMADOL HCL 50 MG PO TABS: 50.0000 mg | ORAL_TABLET | Freq: Four times a day (QID) | ORAL | 0 refills | Status: DC | PRN

## 2020-07-26 NOTE — Patient Instructions (Signed)
Thank you for coming in today.  Call or go to the ER if you develop a large red swollen joint with extreme pain or oozing puss.   Continue the exercises you learned in PT.   If not better let me know. At that time it will be good to talk with orthopedic surgery to discuss options.

## 2020-08-12 ENCOUNTER — Encounter: Payer: Self-pay | Admitting: Gastroenterology

## 2020-08-22 ENCOUNTER — Other Ambulatory Visit: Payer: Self-pay

## 2020-08-22 ENCOUNTER — Encounter: Payer: Self-pay | Admitting: Internal Medicine

## 2020-08-22 ENCOUNTER — Ambulatory Visit (INDEPENDENT_AMBULATORY_CARE_PROVIDER_SITE_OTHER): Payer: Medicare Other | Admitting: Internal Medicine

## 2020-08-22 VITALS — BP 134/82 | HR 83 | Temp 98.2°F | Ht 62.0 in | Wt 136.0 lb

## 2020-08-22 DIAGNOSIS — R739 Hyperglycemia, unspecified: Secondary | ICD-10-CM

## 2020-08-22 DIAGNOSIS — L509 Urticaria, unspecified: Secondary | ICD-10-CM | POA: Diagnosis not present

## 2020-08-22 DIAGNOSIS — E7800 Pure hypercholesterolemia, unspecified: Secondary | ICD-10-CM | POA: Diagnosis not present

## 2020-08-22 DIAGNOSIS — I10 Essential (primary) hypertension: Secondary | ICD-10-CM | POA: Diagnosis not present

## 2020-08-22 DIAGNOSIS — I700 Atherosclerosis of aorta: Secondary | ICD-10-CM

## 2020-08-22 LAB — CBC WITH DIFFERENTIAL/PLATELET
Basophils Absolute: 0 10*3/uL (ref 0.0–0.1)
Basophils Relative: 1 % (ref 0.0–3.0)
Eosinophils Absolute: 0.1 10*3/uL (ref 0.0–0.7)
Eosinophils Relative: 2.2 % (ref 0.0–5.0)
HCT: 39.9 % (ref 36.0–46.0)
Hemoglobin: 13.3 g/dL (ref 12.0–15.0)
Lymphocytes Relative: 19.2 % (ref 12.0–46.0)
Lymphs Abs: 1 10*3/uL (ref 0.7–4.0)
MCHC: 33.5 g/dL (ref 30.0–36.0)
MCV: 92.7 fl (ref 78.0–100.0)
Monocytes Absolute: 0.5 10*3/uL (ref 0.1–1.0)
Monocytes Relative: 9.5 % (ref 3.0–12.0)
Neutro Abs: 3.5 10*3/uL (ref 1.4–7.7)
Neutrophils Relative %: 68.1 % (ref 43.0–77.0)
Platelets: 244 10*3/uL (ref 150.0–400.0)
RBC: 4.3 Mil/uL (ref 3.87–5.11)
RDW: 14.4 % (ref 11.5–15.5)
WBC: 5.1 10*3/uL (ref 4.0–10.5)

## 2020-08-22 LAB — BASIC METABOLIC PANEL
BUN: 24 mg/dL — ABNORMAL HIGH (ref 6–23)
CO2: 31 mEq/L (ref 19–32)
Calcium: 10.4 mg/dL (ref 8.4–10.5)
Chloride: 102 mEq/L (ref 96–112)
Creatinine, Ser: 0.86 mg/dL (ref 0.40–1.20)
GFR: 65.86 mL/min (ref >60.00)
Glucose, Bld: 54 mg/dL — ABNORMAL LOW (ref 70–99)
Potassium: 3.8 mEq/L (ref 3.5–5.1)
Sodium: 141 mEq/L (ref 135–145)

## 2020-08-22 LAB — LIPID PANEL
Cholesterol: 195 mg/dL (ref 0–200)
HDL: 87.4 mg/dL (ref >39.00)
LDL Cholesterol: 88 mg/dL (ref 0–99)
NonHDL: 107.17
Total CHOL/HDL Ratio: 2
Triglycerides: 98 mg/dL (ref 0.0–149.0)
VLDL: 19.6 mg/dL (ref 0.0–40.0)

## 2020-08-22 LAB — TSH: TSH: 1.11 u[IU]/mL (ref 0.35–4.50)

## 2020-08-22 LAB — HEPATIC FUNCTION PANEL
ALT: 33 U/L (ref 0–35)
AST: 40 U/L — ABNORMAL HIGH (ref 0–37)
Albumin: 4.2 g/dL (ref 3.5–5.2)
Alkaline Phosphatase: 21 U/L — ABNORMAL LOW (ref 39–117)
Bilirubin, Direct: 0.1 mg/dL (ref 0.0–0.3)
Total Bilirubin: 0.5 mg/dL (ref 0.2–1.2)
Total Protein: 7 g/dL (ref 6.0–8.3)

## 2020-08-22 LAB — HEMOGLOBIN A1C: Hgb A1c MFr Bld: 6.4 % (ref 4.6–6.5)

## 2020-08-22 MED ORDER — PREDNISONE 10 MG PO TABS: ORAL_TABLET | ORAL | 0 refills | Status: DC

## 2020-08-22 MED ORDER — METHYLPREDNISOLONE ACETATE 80 MG/ML IJ SUSP
80.0000 mg | Freq: Once | INTRAMUSCULAR | Status: AC
Start: 2020-08-22 — End: 2020-08-22
  Administered 2020-08-22: 80 mg via INTRAMUSCULAR

## 2020-08-22 NOTE — Assessment & Plan Note (Signed)
Stable, to contineu statin, low chol diet

## 2020-08-22 NOTE — Assessment & Plan Note (Signed)
BP Readings from Last 3 Encounters:  08/22/20 134/82  07/26/20 118/78  07/12/20 116/78   Stable, pt to continue medical treatment  - norvasc, hct

## 2020-08-22 NOTE — Patient Instructions (Signed)
You had the steroid shot today  Please take all new medication as prescribed - the prednisone  Ok to take the benadryl 50 mg OTC as needed  Please continue all other medications as before, and refills have been done if requested.  Please have the pharmacy call with any other refills you may need.  Please keep your appointments with your specialists as you may have planned  Please go to the LAB at the blood drawing area for the tests to be done\  You will be contacted by phone if any changes need to be made immediately.  Otherwise, you will receive a letter about your results with an explanation, but please check with MyChart first.  Please remember to sign up for MyChart if you have not done so, as this will be important to you in the future with finding out test results, communicating by private email, and scheduling acute appointments online when needed.

## 2020-08-22 NOTE — Assessment & Plan Note (Signed)
Lab Results  Component Value Date   HGBA1C 6.4 08/22/2020   Stable, pt to continue current medical treatment  - diet

## 2020-08-22 NOTE — Progress Notes (Signed)
Patient ID: Briana Schroeder, female   DOB: September 09, 1945, 75 y.o.   MRN: [id number]        Chief Complaint: hives, and HLD, HTn, aortic atherosclerosis       HPI:  Briana Schroeder is a 75 y.o. female here with c/o 1 wk onset diffuse hive like rash to torso and arm, constant with lesions that come and go, itchy but not assoc with other swelling or sob.  No obvious trigger, med changes or ace/arb use.  Pt denies chest pain, increased sob or doe, wheezing, orthopnea, PND, increased LE swelling, palpitations, dizziness or syncope.   Pt denies polydipsia, polyuria, No focal neuro s/s.  Tolerating new statin well        Wt Readings from Last 3 Encounters:  08/22/20 136 lb (61.7 kg)  07/26/20 133 lb (60.3 kg)  07/12/20 133 lb 9.6 oz (60.6 kg)   BP Readings from Last 3 Encounters:  08/22/20 134/82  07/26/20 118/78  07/12/20 116/78         Past Medical History:  Diagnosis Date   ALLERGIC RHINITIS 08/02/2009   Allergy    ANXIETY 10/18/2008   ARM PAIN, RIGHT 04/04/2009   BACK PAIN 08/02/2009   CHEST PAIN 12/05/2009   COLONIC POLYPS, HX OF 10/18/2008   DIZZINESS 12/05/2009   EPIGASTRIC PAIN 01/09/2010   FATIGUE 09/06/2009   GERD 12/06/2009   HOARSENESS 09/06/2009   HYPERLIPIDEMIA 10/18/2008   HYPERTENSION 10/18/2008   IBS 10/18/2008   MYALGIA 03/21/2009   Osteopenia 12/2018   T score -1.7 FRAX 12% / 2.5% table from prior DEXA 2018   PULMONARY SARCOIDOSIS 10/18/2008   SINUSITIS- ACUTE-NOS 03/21/2009   VOCAL CORD NODULE 09/06/2009   Past Surgical History:  Procedure Laterality Date   ABDOMINAL HYSTERECTOMY  08/1992   TAH-DR G FOR FIBROIDS AND MENORRHAGIA   COLONOSCOPY  11-24-2009   3 polyps, +TA and HPP   hx of vocal cord nodules     s/p right finger tendon surgury     UPPER GASTROINTESTINAL ENDOSCOPY  01-08-2010   normal    reports that she has never smoked. She has never used smokeless tobacco. She reports that she does not drink alcohol and does not use drugs. family history  includes Breast cancer (age of onset: 6) in her mother; Diabetes in her mother; Hypertension in her mother and sister. Allergies  Allergen Reactions   Sertraline Hcl Diarrhea   Current Outpatient Medications on File Prior to Visit  Medication Sig Dispense Refill   amLODipine (NORVASC) 10 MG tablet Take 1 tablet (10 mg total) by mouth daily. 90 tablet 3   atorvastatin (LIPITOR) 10 MG tablet Take 1 tablet (10 mg total) by mouth daily. 90 tablet 3   hydrochlorothiazide (HYDRODIURIL) 25 MG tablet Take 1 tablet (25 mg total) by mouth daily. 90 tablet 3   Multiple Vitamin (MULTIVITAMIN) tablet Take 1 tablet by mouth daily.     pantoprazole (PROTONIX) 40 MG tablet Take 1 tablet (40 mg total) by mouth daily. 90 tablet 3   potassium chloride (KLOR-CON) 10 MEQ tablet Take 1 tablet by mouth once a day 90 tablet 3   tiZANidine (ZANAFLEX) 2 MG tablet Take 1 tablet (2 mg total) by mouth at bedtime. 90 tablet 5   traMADol (ULTRAM) 50 MG tablet Take 1 tablet (50 mg total) by mouth every 6 (six) hours as needed. 30 tablet 0   VITAMIN D, CHOLECALCIFEROL, PO Take 5,000 Units by mouth daily.     No current  facility-administered medications on file prior to visit.        ROS:  All others reviewed and negative.  Objective        PE:  BP 134/82    Pulse 83    Temp 98.2 F (36.8 C) (Oral)    Ht 5\' 2"  (1.575 m)    Wt 136 lb (61.7 kg)    SpO2 99%    BMI 24.87 kg/m                 Constitutional: Pt appears in NAD               HENT: Head: NCAT.                Right Ear: External ear normal.                 Left Ear: External ear normal.                Eyes: . Pupils are equal, round, and reactive to light. Conjunctivae and EOM are normal               Nose: without d/c or deformity               Neck: Neck supple. Gross normal ROM               Cardiovascular: Normal rate and regular rhythm.                 Pulmonary/Chest: Effort normal and breath sounds without rales or wheezing.                 Abd:  Soft, NT, ND, + BS, no organomegaly               Neurological: Pt is alert. At baseline orientation, motor grossly intact               Skin:  LE edema - none but diffuse hive like wheal and flare rash lesions to torso and extemities               Psychiatric: Pt behavior is normal without agitation   Micro: none  Cardiac tracings I have personally interpreted today:  none  Pertinent Radiological findings (summarize): none   Lab Results  Component Value Date   WBC 5.1 08/22/2020   HGB 13.3 08/22/2020   HCT 39.9 08/22/2020   PLT 244.0 08/22/2020   GLUCOSE 54 (L) 08/22/2020   CHOL 195 08/22/2020   TRIG 98.0 08/22/2020   HDL 87.40 08/22/2020   LDLDIRECT 83.3 11/17/2012   LDLCALC 88 08/22/2020   ALT 33 08/22/2020   AST 40 (H) 08/22/2020   NA 141 08/22/2020   K 3.8 08/22/2020   CL 102 08/22/2020   CREATININE 0.86 08/22/2020   BUN 24 (H) 08/22/2020   CO2 31 08/22/2020   TSH 1.11 08/22/2020   HGBA1C 6.4 08/22/2020   Assessment/Plan:  Briana Schroeder is a 75 y.o. Black or African American [2] female with  has a past medical history of ALLERGIC RHINITIS (08/02/2009), Allergy, ANXIETY (10/18/2008), ARM PAIN, RIGHT (04/04/2009), BACK PAIN (08/02/2009), CHEST PAIN (12/05/2009), COLONIC POLYPS, HX OF (10/18/2008), DIZZINESS (12/05/2009), EPIGASTRIC PAIN (01/09/2010), FATIGUE (09/06/2009), GERD (12/06/2009), HOARSENESS (09/06/2009), HYPERLIPIDEMIA (10/18/2008), HYPERTENSION (10/18/2008), IBS (10/18/2008), MYALGIA (03/21/2009), Osteopenia (12/2018), PULMONARY SARCOIDOSIS (10/18/2008), SINUSITIS- ACUTE-NOS (03/21/2009), and VOCAL CORD NODULE (09/06/2009).  Aortic atherosclerosis (HCC) Stable, to contineu statin, low chol diet  Essential hypertension BP Readings from Last 3  Encounters:  08/22/20 134/82  07/26/20 118/78  07/12/20 116/78   Stable, pt to continue medical treatment  - norvasc, hct   Hives No obvious trigger, for allergy tetsing, depomedrol IM 80, predpac asd,  to f/u any worsening  symptoms or concerns   Hyperglycemia Lab Results  Component Value Date   HGBA1C 6.4 08/22/2020   Stable, pt to continue current medical treatment  - diet   Hyperlipidemia Lab Results  Component Value Date   LDLCALC 88 08/22/2020   Stable, pt to continue current statin  - lipitor 10  Current Outpatient Medications (Endocrine & Metabolic):    predniSONE (DELTASONE) 10 MG tablet, 3 tabs by mouth per day for 3 days,2tabs per day for 3 days,1tab per day for 3 days  Current Outpatient Medications (Cardiovascular):    amLODipine (NORVASC) 10 MG tablet, Take 1 tablet (10 mg total) by mouth daily.   atorvastatin (LIPITOR) 10 MG tablet, Take 1 tablet (10 mg total) by mouth daily.   hydrochlorothiazide (HYDRODIURIL) 25 MG tablet, Take 1 tablet (25 mg total) by mouth daily.   Current Outpatient Medications (Analgesics):    traMADol (ULTRAM) 50 MG tablet, Take 1 tablet (50 mg total) by mouth every 6 (six) hours as needed.   Current Outpatient Medications (Other):    Multiple Vitamin (MULTIVITAMIN) tablet, Take 1 tablet by mouth daily.   pantoprazole (PROTONIX) 40 MG tablet, Take 1 tablet (40 mg total) by mouth daily.   potassium chloride (KLOR-CON) 10 MEQ tablet, Take 1 tablet by mouth once a day   tiZANidine (ZANAFLEX) 2 MG tablet, Take 1 tablet (2 mg total) by mouth at bedtime.   VITAMIN D, CHOLECALCIFEROL, PO, Take 5,000 Units by mouth daily.   Followup: Return in about 6 weeks (around 10/06/2020).  Cathlean Cower, MD 08/22/2020 9:03 PM [facility]

## 2020-08-22 NOTE — Assessment & Plan Note (Signed)
No obvious trigger, for allergy tetsing, depomedrol IM 80, predpac asd,  to f/u any worsening symptoms or concerns

## 2020-08-22 NOTE — Assessment & Plan Note (Signed)
Lab Results  Component Value Date   LDLCALC 88 08/22/2020   Stable, pt to continue current statin  - lipitor 10  Current Outpatient Medications (Endocrine & Metabolic):  .  predniSONE (DELTASONE) 10 MG tablet, 3 tabs by mouth per day for 3 days,2tabs per day for 3 days,1tab per day for 3 days  Current Outpatient Medications (Cardiovascular):  .  amLODipine (NORVASC) 10 MG tablet, Take 1 tablet (10 mg total) by mouth daily. Marland Kitchen  atorvastatin (LIPITOR) 10 MG tablet, Take 1 tablet (10 mg total) by mouth daily. .  hydrochlorothiazide (HYDRODIURIL) 25 MG tablet, Take 1 tablet (25 mg total) by mouth daily.   Current Outpatient Medications (Analgesics):  .  traMADol (ULTRAM) 50 MG tablet, Take 1 tablet (50 mg total) by mouth every 6 (six) hours as needed.   Current Outpatient Medications (Other):  Marland Kitchen  Multiple Vitamin (MULTIVITAMIN) tablet, Take 1 tablet by mouth daily. .  pantoprazole (PROTONIX) 40 MG tablet, Take 1 tablet (40 mg total) by mouth daily. .  potassium chloride (KLOR-CON) 10 MEQ tablet, Take 1 tablet by mouth once a day .  tiZANidine (ZANAFLEX) 2 MG tablet, Take 1 tablet (2 mg total) by mouth at bedtime. Marland Kitchen  VITAMIN D, CHOLECALCIFEROL, PO, Take 5,000 Units by mouth daily.

## 2020-08-23 ENCOUNTER — Encounter: Payer: Self-pay | Admitting: Gastroenterology

## 2020-08-24 ENCOUNTER — Encounter: Payer: Self-pay | Admitting: Internal Medicine

## 2020-08-24 DIAGNOSIS — L509 Urticaria, unspecified: Secondary | ICD-10-CM

## 2020-08-24 LAB — ALLERGY PANEL, REGION 2, GRASSES
Class: 0
G005 Rye, Perennial: 0.14 kU/L — ABNORMAL HIGH
G009 Red Top: 0.14 kU/L — ABNORMAL HIGH
Johnson Grass: <0.1 kU/L
ORCHARD GRASS (COCKSFOOT) (G3) IGE: 0.16 kU/L — ABNORMAL HIGH
Timothy Grass: 0.15 kU/L — ABNORMAL HIGH

## 2020-08-24 LAB — FOOD ALLERGY PROFILE
Allergen, Salmon, f41: <0.1 kU/L
Almonds: <0.1 kU/L
CLASS: 0
CLASS: 0
CLASS: 0
CLASS: 0
CLASS: 0
CLASS: 0
CLASS: 0
CLASS: 0
CLASS: 0
CLASS: 0
CLASS: 0
Cashew IgE: <0.1 kU/L
Class: 0
Class: 0
Class: 0
Class: 0
Egg White IgE: <0.1 kU/L
Fish Cod: <0.1 kU/L
Hazelnut: <0.1 kU/L
Milk IgE: <0.1 kU/L
Peanut IgE: <0.1 kU/L
Scallop IgE: <0.1 kU/L
Sesame Seed f10: <0.1 kU/L
Shrimp IgE: <0.1 kU/L
Soybean IgE: <0.1 kU/L
Tuna IgE: <0.1 kU/L
Walnut: <0.1 kU/L
Wheat IgE: <0.1 kU/L

## 2020-08-24 LAB — INTERPRETATION:

## 2020-09-29 ENCOUNTER — Encounter: Payer: Medicare Other | Admitting: Obstetrics and Gynecology

## 2020-10-06 ENCOUNTER — Ambulatory Visit: Payer: Medicare Other | Admitting: Internal Medicine

## 2020-10-11 ENCOUNTER — Ambulatory Visit: Payer: Medicare Other | Admitting: Nurse Practitioner

## 2020-10-11 ENCOUNTER — Ambulatory Visit (INDEPENDENT_AMBULATORY_CARE_PROVIDER_SITE_OTHER): Payer: Medicare Other | Admitting: Internal Medicine

## 2020-10-11 ENCOUNTER — Other Ambulatory Visit: Payer: Self-pay

## 2020-10-11 ENCOUNTER — Encounter: Payer: Self-pay | Admitting: Internal Medicine

## 2020-10-11 VITALS — BP 120/78 | HR 76 | Temp 98.1°F | Ht 62.0 in | Wt 141.0 lb

## 2020-10-11 DIAGNOSIS — R739 Hyperglycemia, unspecified: Secondary | ICD-10-CM | POA: Diagnosis not present

## 2020-10-11 DIAGNOSIS — I700 Atherosclerosis of aorta: Secondary | ICD-10-CM | POA: Diagnosis not present

## 2020-10-11 DIAGNOSIS — I10 Essential (primary) hypertension: Secondary | ICD-10-CM

## 2020-10-11 DIAGNOSIS — E7800 Pure hypercholesterolemia, unspecified: Secondary | ICD-10-CM

## 2020-10-11 NOTE — Progress Notes (Signed)
Patient ID: Briana Schroeder, female   DOB: 1946-02-16, 75 y.o.   MRN: [id number]         Chief Complaint:: yearly exam and Follow-up        HPI:  Briana Schroeder is a 75 y.o. female here overall doing well.  Has no specific complaints.  Pt denies chest pain, increased sob or doe, wheezing, orthopnea, PND, increased LE swelling, palpitations, dizziness or syncope.   Pt denies polydipsia, polyuria, or low sugar symptoms.  Pt states overall good compliance with meds, trying to follow lower cholesterol, diabetic diet, wt overall up several lbs.but trying not to gain more.  Denies new neuro focal s/s.   Pt denies fever, wt loss, night sweats, loss of appetite, or other constitutional symptoms  Has colonoscopy already sched for may 18.   O/w up to date with preventive referrals and immunizations.        Wt Readings from Last 3 Encounters:  10/11/20 141 lb (64 kg)  08/22/20 136 lb (61.7 kg)  07/26/20 133 lb (60.3 kg)   BP Readings from Last 3 Encounters:  10/11/20 120/78  08/22/20 134/82  07/26/20 118/78   Immunization History  Administered Date(s) Administered  . PFIZER(Purple Top)SARS-COV-2 Vaccination 08/10/2019, 09/07/2019, 06/09/2020  . Pneumococcal Conjugate-13 05/21/2013  . Pneumococcal Polysaccharide-23 09/06/2009, 11/06/2017  . Td 10/18/2008  . Zoster 10/18/2008   Health Maintenance Due  Topic Date Due  . COLONOSCOPY (Pts 45-76yrs Insurance coverage will need to be confirmed)  04/25/2020      Past Medical History:  Diagnosis Date  . ALLERGIC RHINITIS 08/02/2009  . Allergy   . ANXIETY 10/18/2008  . ARM PAIN, RIGHT 04/04/2009  . BACK PAIN 08/02/2009  . CHEST PAIN 12/05/2009  . COLONIC POLYPS, HX OF 10/18/2008  . DIZZINESS 12/05/2009  . EPIGASTRIC PAIN 01/09/2010  . FATIGUE 09/06/2009  . GERD 12/06/2009  . HOARSENESS 09/06/2009  . HYPERLIPIDEMIA 10/18/2008  . HYPERTENSION 10/18/2008  . IBS 10/18/2008  . MYALGIA 03/21/2009  . Osteopenia 12/2018   T score -1.7 FRAX 12% / 2.5% table from  prior DEXA 2018  . PULMONARY SARCOIDOSIS 10/18/2008  . SINUSITIS- ACUTE-NOS 03/21/2009  . VOCAL CORD NODULE 09/06/2009   Past Surgical History:  Procedure Laterality Date  . ABDOMINAL HYSTERECTOMY  08/1992   TAH-DR G FOR FIBROIDS AND MENORRHAGIA  . COLONOSCOPY  11-24-2009   3 polyps, +TA and HPP  . hx of vocal cord nodules    . s/p right finger tendon surgury    . UPPER GASTROINTESTINAL ENDOSCOPY  01-08-2010   normal    reports that she has never smoked. She has never used smokeless tobacco. She reports that she does not drink alcohol and does not use drugs. family history includes Breast cancer (age of onset: 1) in her mother; Diabetes in her mother; Hypertension in her mother and sister. Allergies  Allergen Reactions  . Sertraline Hcl Diarrhea   Current Outpatient Medications on File Prior to Visit  Medication Sig Dispense Refill  . amLODipine (NORVASC) 10 MG tablet Take 1 tablet (10 mg total) by mouth daily. 90 tablet 3  . atorvastatin (LIPITOR) 10 MG tablet Take 1 tablet (10 mg total) by mouth daily. 90 tablet 3  . hydrochlorothiazide (HYDRODIURIL) 25 MG tablet Take 1 tablet (25 mg total) by mouth daily. 90 tablet 3  . Multiple Vitamin (MULTIVITAMIN) tablet Take 1 tablet by mouth daily.    . pantoprazole (PROTONIX) 40 MG tablet Take 1 tablet (40 mg total) by mouth daily.  90 tablet 3  . potassium chloride (KLOR-CON) 10 MEQ tablet Take 1 tablet by mouth once a day 90 tablet 3  . tiZANidine (ZANAFLEX) 2 MG tablet Take 1 tablet (2 mg total) by mouth at bedtime. 90 tablet 5  . traMADol (ULTRAM) 50 MG tablet Take 1 tablet (50 mg total) by mouth every 6 (six) hours as needed. 30 tablet 0  . VITAMIN D, CHOLECALCIFEROL, PO Take 5,000 Units by mouth daily.    . predniSONE (DELTASONE) 10 MG tablet 3 tabs by mouth per day for 3 days,2tabs per day for 3 days,1tab per day for 3 days (Patient not taking: Reported on 10/11/2020) 18 tablet 0   No current facility-administered medications on file  prior to visit.        ROS:  All others reviewed and negative.  Objective        PE:  BP 120/78 (BP Location: Left Arm, Patient Position: Sitting, Cuff Size: Large)   Pulse 76   Temp 98.1 F (36.7 C) (Oral)   Ht 5\' 2"  (1.575 m)   Wt 141 lb (64 kg)   SpO2 98%   BMI 25.79 kg/m                 Constitutional: Pt appears in NAD               HENT: Head: NCAT.                Right Ear: External ear normal.                 Left Ear: External ear normal.                Eyes: . Pupils are equal, round, and reactive to light. Conjunctivae and EOM are normal               Nose: without d/c or deformity               Neck: Neck supple. Gross normal ROM               Cardiovascular: Normal rate and regular rhythm.                 Pulmonary/Chest: Effort normal and breath sounds without rales or wheezing.                Abd:  Soft, NT, ND, + BS, no organomegaly               Neurological: Pt is alert. At baseline orientation, motor grossly intact               Skin: Skin is warm. No rashes, no other new lesions, LE edema - none               Psychiatric: Pt behavior is normal without agitation   Micro: none  Cardiac tracings I have personally interpreted today:  none  Pertinent Radiological findings (summarize): none   Lab Results  Component Value Date   WBC 5.1 08/22/2020   HGB 13.3 08/22/2020   HCT 39.9 08/22/2020   PLT 244.0 08/22/2020   GLUCOSE 54 (L) 08/22/2020   CHOL 195 08/22/2020   TRIG 98.0 08/22/2020   HDL 87.40 08/22/2020   LDLDIRECT 83.3 11/17/2012   LDLCALC 88 08/22/2020   ALT 33 08/22/2020   AST 40 (H) 08/22/2020   NA 141 08/22/2020   K 3.8 08/22/2020   CL 102 08/22/2020   CREATININE 0.86 08/22/2020  BUN 24 (H) 08/22/2020   CO2 31 08/22/2020   TSH 1.11 08/22/2020   HGBA1C 6.4 08/22/2020   Assessment/Plan:  Briana Schroeder is a 75 y.o. Black or African American [2] female with  has a past medical history of ALLERGIC RHINITIS (08/02/2009), Allergy, ANXIETY  (10/18/2008), ARM PAIN, RIGHT (04/04/2009), BACK PAIN (08/02/2009), CHEST PAIN (12/05/2009), COLONIC POLYPS, HX OF (10/18/2008), DIZZINESS (12/05/2009), EPIGASTRIC PAIN (01/09/2010), FATIGUE (09/06/2009), GERD (12/06/2009), HOARSENESS (09/06/2009), HYPERLIPIDEMIA (10/18/2008), HYPERTENSION (10/18/2008), IBS (10/18/2008), MYALGIA (03/21/2009), Osteopenia (12/2018), PULMONARY SARCOIDOSIS (10/18/2008), SINUSITIS- ACUTE-NOS (03/21/2009), and VOCAL CORD NODULE (09/06/2009).  Aortic atherosclerosis (HCC) Stable to continue low chol diet and statin  - lipitor 10   Hyperlipidemia Lab Results  Component Value Date   LDLCALC 88 08/22/2020   Stable, pt to continue current statin lipitor 10  Hyperglycemia Lab Results  Component Value Date   HGBA1C 6.4 08/22/2020   Stable, pt to continue current medical treatment  - diet and wt control   Essential hypertension BP Readings from Last 3 Encounters:  10/11/20 120/78  08/22/20 134/82  07/26/20 118/78   Stable, pt to continue medical treatment norvasc, hct   Followup: Return in about 6 months (around 04/12/2021).  Cathlean Cower, MD 10/16/2020 5:10 AM [facility]

## 2020-10-16 ENCOUNTER — Encounter: Payer: Self-pay | Admitting: Internal Medicine

## 2020-10-16 NOTE — Assessment & Plan Note (Signed)
BP Readings from Last 3 Encounters:  10/11/20 120/78  08/22/20 134/82  07/26/20 118/78   Stable, pt to continue medical treatment norvasc, hct

## 2020-10-16 NOTE — Assessment & Plan Note (Signed)
Lab Results  Component Value Date   HGBA1C 6.4 08/22/2020   Stable, pt to continue current medical treatment  - diet and wt control

## 2020-10-16 NOTE — Assessment & Plan Note (Signed)
Stable to continue low chol diet and statin  - lipitor 10

## 2020-10-16 NOTE — Assessment & Plan Note (Signed)
Lab Results  Component Value Date   LDLCALC 88 08/22/2020   Stable, pt to continue current statin lipitor 10

## 2020-10-16 NOTE — Patient Instructions (Signed)
Please continue all other medications as before, and refills have been done if requested. ° °Please have the pharmacy call with any other refills you may need. ° °Please continue your efforts at being more active, low cholesterol diet, and weight control. ° °You are otherwise up to date with prevention measures today. ° °Please keep your appointments with your specialists as you may have planned ° °Please make an Appointment to return in 6 months, or sooner if needed °

## 2020-10-17 ENCOUNTER — Other Ambulatory Visit: Payer: Self-pay

## 2020-10-17 ENCOUNTER — Encounter: Payer: Self-pay | Admitting: Internal Medicine

## 2020-10-17 ENCOUNTER — Ambulatory Visit (INDEPENDENT_AMBULATORY_CARE_PROVIDER_SITE_OTHER): Payer: Medicare Other | Admitting: Internal Medicine

## 2020-10-17 VITALS — BP 142/76 | HR 78 | Temp 98.5°F | Ht 62.0 in | Wt 143.0 lb

## 2020-10-17 DIAGNOSIS — H6123 Impacted cerumen, bilateral: Secondary | ICD-10-CM

## 2020-10-17 DIAGNOSIS — J301 Allergic rhinitis due to pollen: Secondary | ICD-10-CM | POA: Diagnosis not present

## 2020-10-17 DIAGNOSIS — I10 Essential (primary) hypertension: Secondary | ICD-10-CM

## 2020-10-17 DIAGNOSIS — J302 Other seasonal allergic rhinitis: Secondary | ICD-10-CM

## 2020-10-17 DIAGNOSIS — R739 Hyperglycemia, unspecified: Secondary | ICD-10-CM

## 2020-10-17 MED ORDER — METHYLPREDNISOLONE ACETATE 80 MG/ML IJ SUSP
80.0000 mg | Freq: Once | INTRAMUSCULAR | Status: AC
  Administered 2020-10-17: 80 mg via INTRAMUSCULAR

## 2020-10-17 NOTE — Progress Notes (Signed)
Patient ID: Briana Schroeder, female   DOB: February 14, 1946, 75 y.o.   MRN: [id number]        Chief Complaint: follow up HTN, HLD and hyperglycemia , allergies       HPI:  Briana Schroeder is a 75 y.o. female here Does have several wks ongoing nasal allergy symptoms with clearish congestion, itch and sneezing, without fever, pain, ST, cough, swelling or wheezing.  Pt denies chest pain, increased sob or doe, wheezing, orthopnea, PND, increased LE swelling, palpitations, dizziness or syncope.  Pt denies polydipsia, polyuria, Denies new focal neuro s/s.   Pt denies fever, wt loss, night sweats, loss of appetite, or other constitutional symptoms         Wt Readings from Last 3 Encounters:  10/20/20 142 lb 3.2 oz (64.5 kg)  10/18/20 143 lb (64.9 kg)  10/17/20 143 lb (64.9 kg)   BP Readings from Last 3 Encounters:  10/20/20 132/86  10/18/20 118/76  10/17/20 (!) 142/76         Past Medical History:  Diagnosis Date  . ALLERGIC RHINITIS 08/02/2009  . Allergy   . ANXIETY 10/18/2008  . ARM PAIN, RIGHT 04/04/2009  . BACK PAIN 08/02/2009  . CHEST PAIN 12/05/2009  . COLONIC POLYPS, HX OF 10/18/2008  . DIZZINESS 12/05/2009  . EPIGASTRIC PAIN 01/09/2010  . FATIGUE 09/06/2009  . GERD 12/06/2009  . HOARSENESS 09/06/2009  . HYPERLIPIDEMIA 10/18/2008  . HYPERTENSION 10/18/2008  . IBS 10/18/2008  . MYALGIA 03/21/2009  . Osteopenia 12/2018   T score -1.7 FRAX 12% / 2.5% table from prior DEXA 2018  . PULMONARY SARCOIDOSIS 10/18/2008  . SINUSITIS- ACUTE-NOS 03/21/2009  . VOCAL CORD NODULE 09/06/2009   Past Surgical History:  Procedure Laterality Date  . ABDOMINAL HYSTERECTOMY  08/1992   TAH-DR G FOR FIBROIDS AND MENORRHAGIA  . COLONOSCOPY  11-24-2009   3 polyps, +TA and HPP  . hx of vocal cord nodules    . s/p right finger tendon surgury    . UPPER GASTROINTESTINAL ENDOSCOPY  01-08-2010   normal    reports that she has never smoked. She has never used smokeless tobacco. She reports that she does not drink alcohol and  does not use drugs. family history includes Breast cancer (age of onset: 41) in her mother; Diabetes in her mother; Hypertension in her mother and sister. Allergies  Allergen Reactions  . Sertraline Hcl Diarrhea   Current Outpatient Medications on File Prior to Visit  Medication Sig Dispense Refill  . amLODipine (NORVASC) 10 MG tablet Take 1 tablet (10 mg total) by mouth daily. 90 tablet 3  . atorvastatin (LIPITOR) 10 MG tablet Take 1 tablet (10 mg total) by mouth daily. 90 tablet 3  . hydrochlorothiazide (HYDRODIURIL) 25 MG tablet Take 1 tablet (25 mg total) by mouth daily. 90 tablet 3  . Multiple Vitamin (MULTIVITAMIN) tablet Take 1 tablet by mouth daily.    . pantoprazole (PROTONIX) 40 MG tablet Take 1 tablet (40 mg total) by mouth daily. (Patient not taking: Reported on 10/20/2020) 90 tablet 3  . potassium chloride (KLOR-CON) 10 MEQ tablet Take 1 tablet by mouth once a day 90 tablet 3  . traMADol (ULTRAM) 50 MG tablet Take 1 tablet (50 mg total) by mouth every 6 (six) hours as needed. 30 tablet 0  . VITAMIN D, CHOLECALCIFEROL, PO Take 5,000 Units by mouth daily.     No current facility-administered medications on file prior to visit.        ROS:  All others reviewed and negative.  Objective        PE:  BP (!) 142/76 (BP Location: Left Arm, Patient Position: Sitting, Cuff Size: Large)   Pulse 78   Temp 98.5 F (36.9 C) (Oral)   Ht 5\' 2"  (1.575 m)   Wt 143 lb (64.9 kg)   SpO2 99%   BMI 26.16 kg/m                 Constitutional: Pt appears in NAD               HENT: Head: NCAT.                Right Ear: External ear normal.                 Left Ear: External ear normal.                Eyes: . Pupils are equal, round, and reactive to light. Conjunctivae and EOM are normal, Bilat tm's with mild erythema.  Max sinus areas none tender.  Pharynx with mild erythema, no exudate               Nose: without d/c or deformity               Neck: Neck supple. Gross normal ROM                Cardiovascular: Normal rate and regular rhythm.                 Pulmonary/Chest: Effort normal and breath sounds without rales or wheezing.                Abd:  Soft, NT, ND, + BS, no organomegaly               Neurological: Pt is alert. At baseline orientation, motor grossly intact               Skin: Skin is warm. No rashes, no other new lesions, LE edema - none               Psychiatric: Pt behavior is normal without agitation   Micro: none  Cardiac tracings I have personally interpreted today:  none  Pertinent Radiological findings (summarize): none   Lab Results  Component Value Date   WBC 5.1 08/22/2020   HGB 13.3 08/22/2020   HCT 39.9 08/22/2020   PLT 244.0 08/22/2020   GLUCOSE 54 (L) 08/22/2020   CHOL 195 08/22/2020   TRIG 98.0 08/22/2020   HDL 87.40 08/22/2020   LDLDIRECT 83.3 11/17/2012   LDLCALC 88 08/22/2020   ALT 33 08/22/2020   AST 40 (H) 08/22/2020   NA 141 08/22/2020   K 3.8 08/22/2020   CL 102 08/22/2020   CREATININE 0.86 08/22/2020   BUN 24 (H) 08/22/2020   CO2 31 08/22/2020   TSH 1.11 08/22/2020   HGBA1C 6.4 08/22/2020   Assessment/Plan:  Briana Schroeder is a 75 y.o. Black or African American [2] female with  has a past medical history of ALLERGIC RHINITIS (08/02/2009), Allergy, ANXIETY (10/18/2008), ARM PAIN, RIGHT (04/04/2009), BACK PAIN (08/02/2009), CHEST PAIN (12/05/2009), COLONIC POLYPS, HX OF (10/18/2008), DIZZINESS (12/05/2009), EPIGASTRIC PAIN (01/09/2010), FATIGUE (09/06/2009), GERD (12/06/2009), HOARSENESS (09/06/2009), HYPERLIPIDEMIA (10/18/2008), HYPERTENSION (10/18/2008), IBS (10/18/2008), MYALGIA (03/21/2009), Osteopenia (12/2018), PULMONARY SARCOIDOSIS (10/18/2008), SINUSITIS- ACUTE-NOS (03/21/2009), and VOCAL CORD NODULE (09/06/2009).  Allergic rhinitis Mild to mod, for depomedrol im 80,  to f/u any worsening symptoms or  concerns  Essential hypertension BP Readings from Last 3 Encounters:  10/20/20 132/86  10/18/20 118/76  10/17/20 (!) 142/76   Stable, pt  to continue medical treatment norvasc, hct   Hyperglycemia Lab Results  Component Value Date   HGBA1C 6.4 08/22/2020   Stable, pt to continue current medical treatment  - diet   Followup: Return if symptoms worsen or fail to improve.  Cathlean Cower, MD 10/24/2020 10:15 PM [facility]

## 2020-10-17 NOTE — Patient Instructions (Signed)
You had the steroid shot today  Please take all new medication as prescribed - the OTC Allegra and even the Nasacort as needed to control the allergies  Please continue all other medications as before, and refills have been done if requested.  Please have the pharmacy call with any other refills you may need.  Please continue your efforts at being more active, low cholesterol diet, and weight control.  Please keep your appointments with your specialists as you may have planned

## 2020-10-18 ENCOUNTER — Ambulatory Visit (INDEPENDENT_AMBULATORY_CARE_PROVIDER_SITE_OTHER): Payer: Medicare Other | Admitting: Nurse Practitioner

## 2020-10-18 ENCOUNTER — Encounter: Payer: Self-pay | Admitting: Nurse Practitioner

## 2020-10-18 VITALS — BP 118/76 | Ht 62.0 in | Wt 143.0 lb

## 2020-10-18 DIAGNOSIS — Z780 Asymptomatic menopausal state: Secondary | ICD-10-CM

## 2020-10-18 DIAGNOSIS — Z01419 Encounter for gynecological examination (general) (routine) without abnormal findings: Secondary | ICD-10-CM

## 2020-10-18 DIAGNOSIS — N8110 Cystocele, unspecified: Secondary | ICD-10-CM

## 2020-10-18 DIAGNOSIS — M8589 Other specified disorders of bone density and structure, multiple sites: Secondary | ICD-10-CM

## 2020-10-18 NOTE — Progress Notes (Signed)
   Briana Schroeder 04-Mar-1946 [id number]   History:  75 y.o. G2P2 presents for breast and pelvic exam. No GYN complaints. Postmenopausal - no HRT. 1994 TAH for fibroids. Normal pap and mammogram history. Osteopenia.   Gynecologic History No LMP recorded. Patient has had a hysterectomy.   Contraception: status post hysterectomy  Health Maintenance Last Pap: No longer screening per guidelines Last mammogram: 02/03/2020. Results were: normal Last colonoscopy: 2016 Last Dexa: 12/16/2018. Results were: T-score -1.7, FRAX 12% / 2.5%  Past medical history, past surgical history, family history and social history were all reviewed and documented in the EPIC chart. Married. Owns daycare centers.   ROS:  A ROS was performed and pertinent positives and negatives are included.  Exam:  Vitals:   10/18/20 0937  BP: 118/76  Weight: 143 lb (64.9 kg)  Height: 5\' 2"  (1.575 m)   Body mass index is 26.16 kg/m.  General appearance:  Normal Thyroid:  Symmetrical, normal in size, without palpable masses or nodularity. Respiratory  Auscultation:  Clear without wheezing or rhonchi Cardiovascular  Auscultation:  Regular rate, without rubs, murmurs or gallops  Edema/varicosities:  Not grossly evident Abdominal  Soft,nontender, without masses, guarding or rebound.  Liver/spleen:  No organomegaly noted  Hernia:  None appreciated  Skin  Inspection:  Grossly normal Breasts: Examined lying and sitting.   Right: Without masses, retractions, nipple discharge or axillary adenopathy.   Left: Without masses, retractions, nipple discharge or axillary adenopathy. Gentitourinary   Inguinal/mons:  Normal without inguinal adenopathy  External genitalia:  Normal appearing vulva with no masses, tenderness, or lesions  BUS/Urethra/Skene's glands:  Normal  Vagina:  Normal appearing with normal color and discharge, no lesions. Atropic changes. Vaginal prolapse.  Cervix:  Absent  Uterus:   Absent  Adnexa/parametria:     Rt: Normal in size, without masses or tenderness.   Lt: Normal in size, without masses or tenderness.  Anus and perineum: Normal  Digital rectal exam: Normal sphincter tone without palpated masses or tenderness  Assessment/Plan:  75 y.o. G2P2 for breast and pelvic exam.   Well female exam with routine gynecological exam - Education provided on SBEs, importance of preventative screenings, current guidelines, high calcium diet, regular exercise, and multivitamin daily. Labs with PCP.   Osteopenia of multiple sites - Plan: DG Bone Density. June 2020 T-score - 1.7 without elevated FRAX. Walks daily and takes Vitamin D supplement. 2-year recall recommended. She will scheduled this for June/July.   Postmenopausal - Plan: DG Bone Density  Vaginal prolapse -1+, asymptomatic.   Screening for cervical cancer - Normal Pap history. No longer screening per guidelines.   Screening for breast cancer - Normal mammogram history.  Continue annual screenings.  Normal breast exam today.  Screening for colon cancer - 2016 colonoscopy. Scheduled for next month.   Return in 1 year for annual.    Tamela Gammon DNP, 9:54 AM 10/18/2020

## 2020-10-18 NOTE — Patient Instructions (Signed)
Health Maintenance After Age 76 After age 76, you are at a higher risk for certain long-term diseases and infections as well as injuries from falls. Falls are a major cause of broken bones and head injuries in people who are older than age 76. Getting regular preventive care can help to keep you healthy and well. Preventive care includes getting regular testing and making lifestyle changes as recommended by your health care provider. Talk with your health care provider about:  Which screenings and tests you should have. A screening is a test that checks for a disease when you have no symptoms.  A diet and exercise plan that is right for you. What should I know about screenings and tests to prevent falls? Screening and testing are the best ways to find a health problem early. Early diagnosis and treatment give you the best chance of managing medical conditions that are common after age 76. Certain conditions and lifestyle choices may make you more likely to have a fall. Your health care provider may recommend:  Regular vision checks. Poor vision and conditions such as cataracts can make you more likely to have a fall. If you wear glasses, make sure to get your prescription updated if your vision changes.  Medicine review. Work with your health care provider to regularly review all of the medicines you are taking, including over-the-counter medicines. Ask your health care provider about any side effects that may make you more likely to have a fall. Tell your health care provider if any medicines that you take make you feel dizzy or sleepy.  Osteoporosis screening. Osteoporosis is a condition that causes the bones to get weaker. This can make the bones weak and cause them to break more easily.  Blood pressure screening. Blood pressure changes and medicines to control blood pressure can make you feel dizzy.  Strength and balance checks. Your health care provider may recommend certain tests to check your  strength and balance while standing, walking, or changing positions.  Foot health exam. Foot pain and numbness, as well as not wearing proper footwear, can make you more likely to have a fall.  Depression screening. You may be more likely to have a fall if you have a fear of falling, feel emotionally low, or feel unable to do activities that you used to do.  Alcohol use screening. Using too much alcohol can affect your balance and may make you more likely to have a fall. What actions can I take to lower my risk of falls? General instructions  Talk with your health care provider about your risks for falling. Tell your health care provider if: ? You fall. Be sure to tell your health care provider about all falls, even ones that seem minor. ? You feel dizzy, sleepy, or off-balance.  Take over-the-counter and prescription medicines only as told by your health care provider. These include any supplements.  Eat a healthy diet and maintain a healthy weight. A healthy diet includes low-fat dairy products, low-fat (lean) meats, and fiber from whole grains, beans, and lots of fruits and vegetables. Home safety  Remove any tripping hazards, such as rugs, cords, and clutter.  Install safety equipment such as grab bars in bathrooms and safety rails on stairs.  Keep rooms and walkways well-lit. Activity  Follow a regular exercise program to stay fit. This will help you maintain your balance. Ask your health care provider what types of exercise are appropriate for you.  If you need a cane or walker,   use it as recommended by your health care provider.  Wear supportive shoes that have nonskid soles.   Lifestyle  Do not drink alcohol if your health care provider tells you not to drink.  If you drink alcohol, limit how much you have: ? 0-1 drink a day for women. ? 0-2 drinks a day for men.  Be aware of how much alcohol is in your drink. In the [place] one drink equals one typical bottle of beer (12  oz), one-half glass of wine (5 oz), or one shot of hard liquor (1 oz).  Do not use any products that contain nicotine or tobacco, such as cigarettes and e-cigarettes. If you need help quitting, ask your health care provider. Summary  Having a healthy lifestyle and getting preventive care can help to protect your health and wellness after age 76.  Screening and testing are the best way to find a health problem early and help you avoid having a fall. Early diagnosis and treatment give you the best chance for managing medical conditions that are more common for people who are older than age 76.  Falls are a major cause of broken bones and head injuries in people who are older than age 76. Take precautions to prevent a fall at home.  Work with your health care provider to learn what changes you can make to improve your health and wellness and to prevent falls. This information is not intended to replace advice given to you by your health care provider. Make sure you discuss any questions you have with your health care provider. Document Revised: 10/09/2018 Document Reviewed: 05/01/2017 Elsevier Patient Education  2021 Elsevier Inc.  

## 2020-10-20 ENCOUNTER — Encounter: Payer: Self-pay | Admitting: Allergy

## 2020-10-20 ENCOUNTER — Other Ambulatory Visit: Payer: Self-pay

## 2020-10-20 ENCOUNTER — Ambulatory Visit (INDEPENDENT_AMBULATORY_CARE_PROVIDER_SITE_OTHER): Payer: Medicare Other | Admitting: Allergy

## 2020-10-20 VITALS — BP 132/86 | HR 88 | Temp 98.9°F | Resp 14 | Ht 62.0 in | Wt 142.2 lb

## 2020-10-20 DIAGNOSIS — J301 Allergic rhinitis due to pollen: Secondary | ICD-10-CM | POA: Diagnosis not present

## 2020-10-20 DIAGNOSIS — H1045 Other chronic allergic conjunctivitis: Secondary | ICD-10-CM

## 2020-10-20 NOTE — Patient Instructions (Signed)
-  With recent antihistamine use we will obtain environmental allergy panel via blood work. -Grass pollen was positive on your allergy test from your PCP.  If the above test we will see what else you are allergic to in the environment -Continue Allegra or Claritin daily at this time,.  If these medications become less effective then you can also try Xyzal or Zyrtec which are all over-the-counter antihistamine options -For nasal congestion recommend use of Flonase, Rhinocort or Nasacort.  Use 2 sprays each nostril daily for 1-2 weeks at a time before stopping once nasal congestion improves for maximum benefit.  These are all over-the-counter nasal steroid sprays that help with congestion control -For watery eyes can use Pataday 1 drop each eye daily as needed.  This is an over-the-counter eyedrop. -If allergy symptoms are not well controlled with medication management may consider allergen immunotherapy which is a 3 to 5-year process that we trains the allergic part of the immune system to become tolerant to the environmental allergens that you are allergic to.  Thus if you are no longer allergic then you should have less or no symptom development and less or no allergy medication needs.  -Your food allergy panel was negative.  I am not concerned for food allergy.  Follow-up in 4 to 6 months or sooner if needed

## 2020-10-20 NOTE — Progress Notes (Signed)
New Patient Note  RE: Briana Schroeder MRN: [id number] DOB: March 03, 1946 Date of Office Visit: 10/20/2020  Primary care provider: Biagio Borg, MD  Chief Complaint: Allergies  History of present illness: Briana Schroeder is a 75 y.o. female presenting today for evaluation of allergies.  She states she had allergy testing by her PCP and states was told she had allergies but doesn't know what she is allergic too.   During spring primarily (this year is worst) she reports watery eyes, nasal congestion, sneezing, ear fullness.  She has taken both allegra and claritin for symptom control.  Takes one daily every morning and it does help.  Has not used nasal spray or eye drops before.  She did take her Allegra yesterday.  She has a cleaner that comes to clean her home every other week.    She states sometimes after she eats she can have increase in mucus production.  She states she does drink a lot of dairy based drinks every day.  She otherwise denies any symptoms consistent with allergic reaction after eating.    She has no history of eczema or asthma.  Review of systems: Review of Systems  Constitutional: Negative.   HENT:       See HPI  Eyes:       See HPI  Respiratory: Negative.   Cardiovascular: Negative.   Gastrointestinal: Negative.   Musculoskeletal: Negative.   Skin: Negative.   Neurological: Negative.     All other systems negative unless noted above in HPI  Past medical history: Past Medical History:  Diagnosis Date  . ALLERGIC RHINITIS 08/02/2009  . Allergy   . ANXIETY 10/18/2008  . ARM PAIN, RIGHT 04/04/2009  . BACK PAIN 08/02/2009  . CHEST PAIN 12/05/2009  . COLONIC POLYPS, HX OF 10/18/2008  . DIZZINESS 12/05/2009  . EPIGASTRIC PAIN 01/09/2010  . FATIGUE 09/06/2009  . GERD 12/06/2009  . HOARSENESS 09/06/2009  . HYPERLIPIDEMIA 10/18/2008  . HYPERTENSION 10/18/2008  . IBS 10/18/2008  . MYALGIA 03/21/2009  . Osteopenia 12/2018   T score -1.7 FRAX 12% / 2.5% table from  prior DEXA 2018  . PULMONARY SARCOIDOSIS 10/18/2008  . SINUSITIS- ACUTE-NOS 03/21/2009  . VOCAL CORD NODULE 09/06/2009    Past surgical history: Past Surgical History:  Procedure Laterality Date  . ABDOMINAL HYSTERECTOMY  08/1992   TAH-DR G FOR FIBROIDS AND MENORRHAGIA  . COLONOSCOPY  11-24-2009   3 polyps, +TA and HPP  . hx of vocal cord nodules    . s/p right finger tendon surgury    . UPPER GASTROINTESTINAL ENDOSCOPY  01-08-2010   normal    Family history:  Family History  Problem Relation Age of Onset  . Diabetes Mother   . Hypertension Mother   . Breast cancer Mother 18  . Hypertension Sister   . Colon cancer Neg Hx   . Colon polyps Neg Hx   . Rectal cancer Neg Hx   . Stomach cancer Neg Hx     Social history: Lives in a home without carpeting with gas heating and central cooling.  There is no pets in the home.  There is no concern for water damage, mildew or roaches in the home.  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  She does not report a smoking history.  Medication List: Current Outpatient Medications  Medication Sig Dispense Refill  . amLODipine (NORVASC) 10 MG tablet Take 1 tablet (10 mg total) by mouth daily. 90 tablet 3  .  atorvastatin (LIPITOR) 10 MG tablet Take 1 tablet (10 mg total) by mouth daily. 90 tablet 3  . hydrochlorothiazide (HYDRODIURIL) 25 MG tablet Take 1 tablet (25 mg total) by mouth daily. 90 tablet 3  . Multiple Vitamin (MULTIVITAMIN) tablet Take 1 tablet by mouth daily.    . potassium chloride (KLOR-CON) 10 MEQ tablet Take 1 tablet by mouth once a day 90 tablet 3  . traMADol (ULTRAM) 50 MG tablet Take 1 tablet (50 mg total) by mouth every 6 (six) hours as needed. 30 tablet 0  . VITAMIN D, CHOLECALCIFEROL, PO Take 5,000 Units by mouth daily.    . pantoprazole (PROTONIX) 40 MG tablet Take 1 tablet (40 mg total) by mouth daily. (Patient not taking: Reported on 10/20/2020) 90 tablet 3   No current facility-administered medications for this  visit.    Known medication allergies: Allergies  Allergen Reactions  . Sertraline Hcl Diarrhea     Physical examination: Blood pressure 132/86, pulse 88, temperature 98.9 F (37.2 C), resp. rate 14, height 5\' 2"  (1.575 m), weight 142 lb 3.2 oz (64.5 kg), SpO2 98 %.  General: Alert, interactive, in no acute distress. HEENT: PERRLA, TMs pearly gray, turbinates edematous and pale without discharge, post-pharynx non erythematous. Neck: Supple without lymphadenopathy. Lungs: Clear to auscultation without wheezing, rhonchi or rales. {no increased work of breathing. CV: Normal S1, S2 without murmurs. Abdomen: Nondistended, nontender. Skin: Warm and dry, without lesions or rashes. Extremities:  No clubbing, cyanosis or edema. Neuro:   Grossly intact.  Diagnositics/Labs:  Allergy testing: Unable to perform due to recent antihistamine use   Assessment and plan: Allergic rhinitis with conjunctivitis  -With recent antihistamine use we will obtain environmental allergy panel via blood work. -Grass pollen was positive on your allergy test from your PCP.  If the above test we will see what else you are allergic to in the environment -Continue Allegra or Claritin daily at this time,.  If these medications become less effective then you can also try Xyzal or Zyrtec which are all over-the-counter antihistamine options -For nasal congestion recommend use of Flonase, Rhinocort or Nasacort.  Use 2 sprays each nostril daily for 1-2 weeks at a time before stopping once nasal congestion improves for maximum benefit.  These are all over-the-counter nasal steroid sprays that help with congestion control -For watery eyes can use Pataday 1 drop each eye daily as needed.  This is an over-the-counter eyedrop. -If allergy symptoms are not well controlled with medication management may consider allergen immunotherapy which is a 3 to 5-year process that we trains the allergic part of the immune system to become  tolerant to the environmental allergens that you are allergic to.  Thus if you are no longer allergic then you should have less or no symptom development and less or no allergy medication needs.  -Your food allergy panel was negative.  I am not concerned for food allergy.  Follow-up in 4 to 6 months or sooner if needed  I appreciate the opportunity to take part in Aubriana's care. Please do not hesitate to contact me with questions.  Sincerely,   Prudy Feeler, MD Allergy/Immunology [facility]

## 2020-10-24 ENCOUNTER — Encounter: Payer: Self-pay | Admitting: Internal Medicine

## 2020-10-24 ENCOUNTER — Ambulatory Visit: Payer: Medicare Other | Admitting: Family Medicine

## 2020-10-24 NOTE — Assessment & Plan Note (Signed)
Mild to mod, for depomedrol im 80,  to f/u any worsening symptoms or concerns

## 2020-10-24 NOTE — Assessment & Plan Note (Signed)
BP Readings from Last 3 Encounters:  10/20/20 132/86  10/18/20 118/76  10/17/20 (!) 142/76   Stable, pt to continue medical treatment norvasc, hct

## 2020-10-24 NOTE — Assessment & Plan Note (Signed)
Lab Results  Component Value Date   HGBA1C 6.4 08/22/2020   Stable, pt to continue current medical treatment  - diet

## 2020-10-28 LAB — ALLERGENS W/TOTAL IGE AREA 2
Alternaria Alternata IgE: <0.1 kU/L
Aspergillus Fumigatus IgE: <0.1 kU/L
Bermuda Grass IgE: <0.1 kU/L
Cat Dander IgE: 0.49 kU/L — AB
Cedar, Mountain IgE: <0.1 kU/L
Cladosporium Herbarum IgE: <0.1 kU/L
Cockroach, German IgE: <0.1 kU/L
Common Silver Birch IgE: <0.1 kU/L
Cottonwood IgE: <0.1 kU/L
D Farinae IgE: <0.1 kU/L
D Pteronyssinus IgE: <0.1 kU/L
Dog Dander IgE: 0.17 kU/L — AB
Elm, American IgE: <0.1 kU/L
IgE (Immunoglobulin E), Serum: 34 IU/mL (ref 6–495)
Johnson Grass IgE: <0.1 kU/L
Maple/Box Elder IgE: <0.1 kU/L
Mouse Urine IgE: <0.1 kU/L
Oak, White IgE: <0.1 kU/L
Pecan, Hickory IgE: <0.1 kU/L
Penicillium Chrysogen IgE: <0.1 kU/L
Pigweed, Rough IgE: <0.1 kU/L
Ragweed, Short IgE: <0.1 kU/L
Sheep Sorrel IgE Qn: <0.1 kU/L
Timothy Grass IgE: 0.15 kU/L — AB
White Mulberry IgE: <0.1 kU/L

## 2020-10-29 ENCOUNTER — Other Ambulatory Visit: Payer: Self-pay | Admitting: Internal Medicine

## 2020-10-31 MED ORDER — TRAMADOL HCL 50 MG PO TABS: 50.0000 mg | ORAL_TABLET | Freq: Four times a day (QID) | ORAL | 0 refills | Status: DC | PRN

## 2020-11-16 ENCOUNTER — Encounter: Payer: Medicare Other | Admitting: Gastroenterology

## 2020-12-06 ENCOUNTER — Telehealth: Payer: Self-pay | Admitting: Internal Medicine

## 2020-12-06 NOTE — Telephone Encounter (Signed)
LVM for pt to rtn my call to schedule AWV with [facility]. Please schedule AWV if pt calls the office  

## 2020-12-20 DIAGNOSIS — Z0289 Encounter for other administrative examinations: Secondary | ICD-10-CM

## 2020-12-29 ENCOUNTER — Ambulatory Visit (AMBULATORY_SURGERY_CENTER): Payer: Medicare Other | Admitting: *Deleted

## 2020-12-29 ENCOUNTER — Other Ambulatory Visit: Payer: Self-pay

## 2020-12-29 VITALS — Ht 63.0 in | Wt 136.0 lb

## 2020-12-29 DIAGNOSIS — Z86010 Personal history of colonic polyps: Secondary | ICD-10-CM

## 2020-12-29 MED ORDER — NA SULFATE-K SULFATE-MG SULF 17.5-3.13-1.6 GM/177ML PO SOLN: 1.0000 | Freq: Once | ORAL | 0 refills | Status: AC

## 2020-12-29 NOTE — Progress Notes (Signed)
Virtual pre-visit completed. Instructions Sent through MyChart and mailed to [facility], [place] [address]  No egg or soy allergy known to patient  No issues with past sedation with any surgeries or procedures Patient denies ever being told they had issues or difficulty with intubation  No FH of Malignant Hyperthermia No diet pills per patient No home 02 use per patient  No blood thinners per patient  Pt denies issues with constipation  No A fib or A flutter  EMMI video to pt or via [place] 19 guidelines implemented in PV today with Pt and RN  Pt is fully vaccinated  for Covid     Due to the COVID-19 pandemic we are asking patients to follow certain guidelines.  Pt aware of COVID protocols and [facility] guidelines

## 2021-01-12 ENCOUNTER — Other Ambulatory Visit: Payer: Self-pay

## 2021-01-12 ENCOUNTER — Encounter: Payer: Self-pay | Admitting: Gastroenterology

## 2021-01-12 ENCOUNTER — Ambulatory Visit (AMBULATORY_SURGERY_CENTER): Payer: Medicare Other | Admitting: Gastroenterology

## 2021-01-12 VITALS — BP 105/75 | HR 67 | Temp 97.3°F | Resp 17 | Ht 63.0 in | Wt 136.0 lb

## 2021-01-12 DIAGNOSIS — D122 Benign neoplasm of ascending colon: Secondary | ICD-10-CM

## 2021-01-12 DIAGNOSIS — Z86010 Personal history of colonic polyps: Secondary | ICD-10-CM

## 2021-01-12 DIAGNOSIS — D123 Benign neoplasm of transverse colon: Secondary | ICD-10-CM

## 2021-01-12 MED ORDER — SODIUM CHLORIDE 0.9 % IV SOLN: 500.0000 mL | Freq: Once | INTRAVENOUS | Status: DC

## 2021-01-12 NOTE — Progress Notes (Signed)
Called to room to assist during endoscopic procedure.  Patient ID and intended procedure confirmed with present staff. Received instructions for my participation in the procedure from the performing physician.  

## 2021-01-12 NOTE — Progress Notes (Signed)
pt tolerated well. VSS. awake and to recovery. Report given to RN.  

## 2021-01-12 NOTE — Op Note (Signed)
[facility] Patient Name: Briana Schroeder Procedure Date: 01/12/2021 11:05 AM MRN: [id number] Endoscopist: Ladene Artist , MD Age: 75 Referring MD:  Date of Birth: 11-23-1945 Gender: Female Account #: [id number] Procedure:                Colonoscopy Indications:              Surveillance: Personal history of adenomatous                            polyps on last colonoscopy > 5 years ago Medicines:                Monitored Anesthesia Care Procedure:                Pre-Anesthesia Assessment:                           - Prior to the procedure, a History and Physical                            was performed, and patient medications and                            allergies were reviewed. The patient's tolerance of                            previous anesthesia was also reviewed. The risks                            and benefits of the procedure and the sedation                            options and risks were discussed with the patient.                            All questions were answered, and informed consent                            was obtained. Prior Anticoagulants: The patient has                            taken no previous anticoagulant or antiplatelet                            agents. ASA Grade Assessment: II - A patient with                            mild systemic disease. After reviewing the risks                            and benefits, the patient was deemed in                            satisfactory condition to undergo the procedure.  After obtaining informed consent, the colonoscope                            was passed under direct vision. Throughout the                            procedure, the patient's blood pressure, pulse, and                            oxygen saturations were monitored continuously. The                            CF [id number] #[id number] was introduced through the anus                            and advanced to  the the cecum, identified by                            appendiceal orifice and ileocecal valve. The                            ileocecal valve, appendiceal orifice, and rectum                            were photographed. The quality of the bowel                            preparation was excellent. The colonoscopy was                            performed without difficulty. The patient tolerated                            the procedure well. Scope In: 11:15:55 AM Scope Out: 11:32:59 AM Scope Withdrawal Time: 0 hours 15 minutes 50 seconds  Total Procedure Duration: 0 hours 17 minutes 4 seconds  Findings:                 The perianal and digital rectal examinations were                            normal.                           Two sessile polyps were found in the transverse                            colon and ascending colon. The polyps were 5 to 6                            mm in size. These polyps were removed with a cold                            snare. Resection and retrieval were complete.  A few small-mouthed diverticula were found in the                            sigmoid colon.                           Internal hemorrhoids were found during                            retroflexion. The hemorrhoids were small and Grade                            I (internal hemorrhoids that do not prolapse).                           The exam was otherwise without abnormality on                            direct and retroflexion views. Complications:            No immediate complications. Estimated blood loss:                            None. Estimated Blood Loss:     Estimated blood loss: none. Impression:               - Two 5 to 6 mm polyps in the transverse colon and                            in the ascending colon, removed with a cold snare.                            Resected and retrieved.                           - Mild diverticulosis in the sigmoid  colon.                           - Internal hemorrhoids.                           - The examination was otherwise normal on direct                            and retroflexion views. Recommendation:           - Patient has a contact number available for                            emergencies. The signs and symptoms of potential                            delayed complications were discussed with the                            patient. Return to normal activities tomorrow.  Written discharge instructions were provided to the                            patient.                           - Resume previous diet.                           - Continue present medications.                           - Await pathology results.                           - No repeat colonoscopy due to age. Ladene Artist, MD 01/12/2021 11:37:24 AM This report has been signed electronically.

## 2021-01-12 NOTE — Patient Instructions (Signed)
YOU HAD AN ENDOSCOPIC PROCEDURE TODAY AT THE [facility]:   Refer to the procedure report that was given to you for any specific questions about what was found during the examination.  If the procedure report does not answer your questions, please call your gastroenterologist to clarify.  If you requested that your care partner not be given the details of your procedure findings, then the procedure report has been included in a sealed envelope for you to review at your convenience later.  YOU SHOULD EXPECT: Some feelings of bloating in the abdomen. Passage of more gas than usual.  Walking can help get rid of the air that was put into your GI tract during the procedure and reduce the bloating. If you had a lower endoscopy (such as a colonoscopy or flexible sigmoidoscopy) you may notice spotting of blood in your stool or on the toilet paper. If you underwent a bowel prep for your procedure, you may not have a normal bowel movement for a few days.  Please Note:  You might notice some irritation and congestion in your nose or some drainage.  This is from the oxygen used during your procedure.  There is no need for concern and it should clear up in a day or so.  SYMPTOMS TO REPORT IMMEDIATELY:   Following lower endoscopy (colonoscopy or flexible sigmoidoscopy):  Excessive amounts of blood in the stool  Significant tenderness or worsening of abdominal pains  Swelling of the abdomen that is new, acute  Fever of 100F or higher   Following upper endoscopy (EGD)  Vomiting of blood or coffee ground material  New chest pain or pain under the shoulder blades  Painful or persistently difficult swallowing  New shortness of breath  Fever of 100F or higher  Black, tarry-looking stools  For urgent or emergent issues, a gastroenterologist can be reached at any hour by calling [phone]. Do not use MyChart messaging for urgent concerns.    DIET:  We do recommend a small meal at first, but  then you may proceed to your regular diet.  Drink plenty of fluids but you should avoid alcoholic beverages for 24 hours.  ACTIVITY:  You should plan to take it easy for the rest of today and you should NOT DRIVE or use heavy machinery until tomorrow (because of the sedation medicines used during the test).    FOLLOW UP: Our staff will call the number listed on your records 48-72 hours following your procedure to check on you and address any questions or concerns that you may have regarding the information given to you following your procedure. If we do not reach you, we will leave a message.  We will attempt to reach you two times.  During this call, we will ask if you have developed any symptoms of COVID 19. If you develop any symptoms (ie: fever, flu-like symptoms, shortness of breath, cough etc.) before then, please call [phone].  If you test positive for Covid 19 in the 2 weeks post procedure, please call and report this information to us.    If any biopsies were taken you will be contacted by phone or by letter within the next 1-3 weeks.  Please call us at [phone] if you have not heard about the biopsies in 3 weeks.    SIGNATURES/CONFIDENTIALITY: You and/or your care partner have signed paperwork which will be entered into your electronic medical record.  These signatures attest to the fact that that the information above on   your After Visit Summary has been reviewed and is understood.  Full responsibility of the confidentiality of this discharge information lies with you and/or your care-partner. 

## 2021-01-16 ENCOUNTER — Telehealth: Payer: Self-pay | Admitting: *Deleted

## 2021-01-16 NOTE — Telephone Encounter (Signed)
First follow up call attempt.  LVM. 

## 2021-01-19 ENCOUNTER — Other Ambulatory Visit: Payer: Self-pay | Admitting: Internal Medicine

## 2021-01-19 DIAGNOSIS — Z1231 Encounter for screening mammogram for malignant neoplasm of breast: Secondary | ICD-10-CM

## 2021-01-23 ENCOUNTER — Other Ambulatory Visit: Payer: Self-pay

## 2021-01-23 ENCOUNTER — Ambulatory Visit (INDEPENDENT_AMBULATORY_CARE_PROVIDER_SITE_OTHER): Payer: Medicare Other | Admitting: Internal Medicine

## 2021-01-23 VITALS — BP 138/70 | HR 96 | Temp 98.2°F | Ht 63.0 in | Wt 137.0 lb

## 2021-01-23 DIAGNOSIS — R739 Hyperglycemia, unspecified: Secondary | ICD-10-CM

## 2021-01-23 DIAGNOSIS — I10 Essential (primary) hypertension: Secondary | ICD-10-CM | POA: Diagnosis not present

## 2021-01-23 DIAGNOSIS — E7800 Pure hypercholesterolemia, unspecified: Secondary | ICD-10-CM | POA: Diagnosis not present

## 2021-01-23 DIAGNOSIS — L282 Other prurigo: Secondary | ICD-10-CM | POA: Diagnosis not present

## 2021-01-23 MED ORDER — CETIRIZINE HCL 10 MG PO TABS: 10.0000 mg | ORAL_TABLET | Freq: Every day | ORAL | 11 refills | Status: DC

## 2021-01-23 NOTE — Progress Notes (Signed)
Chief Complaint: follow up HTN, HLD and hyperglycemia and itching all over       HPI:  Briana Schroeder is a 75 y.o. female here with itching all over and intermittent faint rash not present today, and no swelling; Pt denies chest pain, increased sob or doe, wheezing, orthopnea, PND, increased LE swelling, palpitations, dizziness or syncope.   Pt denies polydipsia, polyuria, or new focal neuro s/s.   Pt denies fever, wt loss, night sweats, loss of appetite, or other constitutional symptoms  No other new complaints       Wt Readings from Last 3 Encounters:  01/23/21 137 lb (62.1 kg)  01/12/21 136 lb (61.7 kg)  12/29/20 136 lb (61.7 kg)   BP Readings from Last 3 Encounters:  01/23/21 138/70  01/12/21 105/75  10/20/20 132/86         Past Medical History:  Diagnosis Date   ALLERGIC RHINITIS 08/02/2009   Allergy    ANXIETY 10/18/2008   ARM PAIN, RIGHT 04/04/2009   Arthritis    BACK PAIN 08/02/2009   CHEST PAIN 12/05/2009   COLONIC POLYPS, HX OF 10/18/2008   DIZZINESS 12/05/2009   EPIGASTRIC PAIN 01/09/2010   FATIGUE 09/06/2009   GERD 12/06/2009   HOARSENESS 09/06/2009   HYPERLIPIDEMIA 10/18/2008   HYPERTENSION 10/18/2008   IBS 10/18/2008   MYALGIA 03/21/2009   Osteopenia 12/2018   T score -1.7 FRAX 12% / 2.5% table from prior DEXA 2018   PULMONARY SARCOIDOSIS 10/18/2008   SINUSITIS- ACUTE-NOS 03/21/2009   VOCAL CORD NODULE 09/06/2009   Past Surgical History:  Procedure Laterality Date   ABDOMINAL HYSTERECTOMY  08/1992   TAH-DR G FOR FIBROIDS AND MENORRHAGIA   COLONOSCOPY  11-24-2009   3 polyps, +TA and HPP   hx of vocal cord nodules     s/p right finger tendon surgury     UPPER GASTROINTESTINAL ENDOSCOPY  01-08-2010   normal    reports that she has never smoked. She has never used smokeless tobacco. She reports that she does not drink alcohol and does not use drugs. family history includes Breast cancer (age of onset: 28) in her mother; Diabetes in her mother;  Hypertension in her mother and sister. Allergies  Allergen Reactions   Sertraline Hcl Diarrhea   Current Outpatient Medications on File Prior to Visit  Medication Sig Dispense Refill   amLODipine (NORVASC) 10 MG tablet Take 1 tablet (10 mg total) by mouth daily. 90 tablet 3   atorvastatin (LIPITOR) 10 MG tablet Take 1 tablet (10 mg total) by mouth daily. 90 tablet 3   hydrochlorothiazide (HYDRODIURIL) 25 MG tablet Take 1 tablet (25 mg total) by mouth daily. 90 tablet 3   Multiple Vitamin (MULTIVITAMIN) tablet Take 1 tablet by mouth daily.     pantoprazole (PROTONIX) 40 MG tablet Take 1 tablet (40 mg total) by mouth daily. 90 tablet 3   potassium chloride (KLOR-CON) 10 MEQ tablet Take 1 tablet by mouth once a day 90 tablet 3   VITAMIN D, CHOLECALCIFEROL, PO Take 5,000 Units by mouth daily.     No current facility-administered medications on file prior to visit.        ROS:  All others reviewed and negative.  Objective        PE:  BP 138/70   Pulse 96   Temp 98.2 F (36.8 C) (Oral)   Ht '5\' 3"'$  (1.6 m)   Wt 137 lb (62.1 kg)   SpO2 99%  BMI 24.27 kg/m                 Constitutional: Pt appears in NAD               HENT: Head: NCAT.                Right Ear: External ear normal.                 Left Ear: External ear normal.                Eyes: . Pupils are equal, round, and reactive to light. Conjunctivae and EOM are normal               Nose: without d/c or deformity               Neck: Neck supple. Gross normal ROM               Cardiovascular: Normal rate and regular rhythm.                 Pulmonary/Chest: Effort normal and breath sounds without rales or wheezing.                Abd:  Soft, NT, ND, + BS, no organomegaly               Neurological: Pt is alert. At baseline orientation, motor grossly intact               Skin: Skin is warm. No rashes, no other new lesions, LE edema - none               Psychiatric: Pt behavior is normal without agitation   Micro:  none  Cardiac tracings I have personally interpreted today:  none  Pertinent Radiological findings (summarize): none   Lab Results  Component Value Date   WBC 5.1 08/22/2020   HGB 13.3 08/22/2020   HCT 39.9 08/22/2020   PLT 244.0 08/22/2020   GLUCOSE 54 (L) 08/22/2020   CHOL 195 08/22/2020   TRIG 98.0 08/22/2020   HDL 87.40 08/22/2020   LDLDIRECT 83.3 11/17/2012   LDLCALC 88 08/22/2020   ALT 33 08/22/2020   AST 40 (H) 08/22/2020   NA 141 08/22/2020   K 3.8 08/22/2020   CL 102 08/22/2020   CREATININE 0.86 08/22/2020   BUN 24 (H) 08/22/2020   CO2 31 08/22/2020   TSH 1.11 08/22/2020   HGBA1C 6.4 08/22/2020   Assessment/Plan:  Briana Schroeder is a 75 y.o. Black or African American [2] female with  has a past medical history of ALLERGIC RHINITIS (08/02/2009), Allergy, ANXIETY (10/18/2008), ARM PAIN, RIGHT (04/04/2009), Arthritis, BACK PAIN (08/02/2009), CHEST PAIN (12/05/2009), COLONIC POLYPS, HX OF (10/18/2008), DIZZINESS (12/05/2009), EPIGASTRIC PAIN (01/09/2010), FATIGUE (09/06/2009), GERD (12/06/2009), HOARSENESS (09/06/2009), HYPERLIPIDEMIA (10/18/2008), HYPERTENSION (10/18/2008), IBS (10/18/2008), MYALGIA (03/21/2009), Osteopenia (12/2018), PULMONARY SARCOIDOSIS (10/18/2008), SINUSITIS- ACUTE-NOS (03/21/2009), and VOCAL CORD NODULE (09/06/2009).  Hyperlipidemia Lab Results  Component Value Date   LDLCALC 88 08/22/2020   Uncontrolled, goal ldl < 70, pt to continue current statin lipitor 10 as declines change now, for lower chol diet   Hyperglycemia Lab Results  Component Value Date   HGBA1C 6.4 08/22/2020   Stable, pt to continue current medical treatment  - diet   Essential hypertension BP Readings from Last 3 Encounters:  01/23/21 138/70  01/12/21 105/75  10/20/20 132/86   Stable, pt to continue medical treatment norvasc   Pruritic rash Most likely  c/w allergic type, for zyrtec 10 qd otc prn, consdier predpac if not imrpoved,  to f/u any worsening  symptoms or concerns  Followup: No follow-ups on file.  Cathlean Cower, MD 01/28/2021 9:37 PM [facility]

## 2021-01-24 ENCOUNTER — Other Ambulatory Visit: Payer: Self-pay | Admitting: Internal Medicine

## 2021-01-25 ENCOUNTER — Encounter: Payer: Self-pay | Admitting: Gastroenterology

## 2021-01-28 ENCOUNTER — Encounter: Payer: Self-pay | Admitting: Internal Medicine

## 2021-01-28 DIAGNOSIS — L282 Other prurigo: Secondary | ICD-10-CM | POA: Insufficient documentation

## 2021-01-28 NOTE — Assessment & Plan Note (Signed)
Lab Results  Component Value Date   HGBA1C 6.4 08/22/2020   Stable, pt to continue current medical treatment  - diet

## 2021-01-28 NOTE — Assessment & Plan Note (Signed)
Lab Results  Component Value Date   LDLCALC 88 08/22/2020   Uncontrolled, goal ldl < 70, pt to continue current statin lipitor 10 as declines change now, for lower chol diet

## 2021-01-28 NOTE — Assessment & Plan Note (Signed)
BP Readings from Last 3 Encounters:  01/23/21 138/70  01/12/21 105/75  10/20/20 132/86   Stable, pt to continue medical treatment norvasc

## 2021-01-28 NOTE — Assessment & Plan Note (Signed)
Most likely c/w allergic type, for zyrtec 10 qd otc prn, consdier predpac if not imrpoved,  to f/u any worsening symptoms or concerns

## 2021-02-19 ENCOUNTER — Other Ambulatory Visit: Payer: Self-pay | Admitting: Internal Medicine

## 2021-02-20 NOTE — Telephone Encounter (Signed)
Please refill as per office routine med refill policy (all routine meds refilled for 3 mo or monthly per pt preference up to one year from last visit, then month to month grace period for 3 mo, then further med refills will have to be denied)  

## 2021-03-14 ENCOUNTER — Other Ambulatory Visit: Payer: Self-pay

## 2021-03-14 ENCOUNTER — Ambulatory Visit
Admission: RE | Admit: 2021-03-14 | Discharge: 2021-03-14 | Disposition: A | Discharge status: Home / Self Care | Payer: Medicare Other | Source: Ambulatory Visit | Attending: Internal Medicine | Admitting: Internal Medicine

## 2021-03-14 DIAGNOSIS — Z1231 Encounter for screening mammogram for malignant neoplasm of breast: Secondary | ICD-10-CM

## 2021-03-23 ENCOUNTER — Ambulatory Visit: Payer: Medicare Other | Admitting: Allergy

## 2021-03-24 ENCOUNTER — Ambulatory Visit (INDEPENDENT_AMBULATORY_CARE_PROVIDER_SITE_OTHER): Payer: Medicare Other | Admitting: Internal Medicine

## 2021-03-24 ENCOUNTER — Other Ambulatory Visit: Payer: Self-pay

## 2021-03-24 ENCOUNTER — Encounter: Payer: Self-pay | Admitting: Internal Medicine

## 2021-03-24 VITALS — BP 138/80 | HR 79 | Temp 98.5°F | Ht 63.0 in | Wt 137.0 lb

## 2021-03-24 DIAGNOSIS — M545 Low back pain, unspecified: Secondary | ICD-10-CM | POA: Diagnosis not present

## 2021-03-24 DIAGNOSIS — L282 Other prurigo: Secondary | ICD-10-CM

## 2021-03-24 DIAGNOSIS — H6121 Impacted cerumen, right ear: Secondary | ICD-10-CM

## 2021-03-24 DIAGNOSIS — H6123 Impacted cerumen, bilateral: Secondary | ICD-10-CM

## 2021-03-24 DIAGNOSIS — G8929 Other chronic pain: Secondary | ICD-10-CM

## 2021-03-24 DIAGNOSIS — J301 Allergic rhinitis due to pollen: Secondary | ICD-10-CM

## 2021-03-24 NOTE — Patient Instructions (Signed)
Your right ear was irrigated clear of wax today  You can also take OTC Benadryl cream for itching as needed  Please continue all other medications as before, and refills have been done if requested.  Please have the pharmacy call with any other refills you may need.  Please continue your efforts at being more active, low cholesterol diet, and weight control.  Please keep your appointments with your specialists as you may have planned

## 2021-03-24 NOTE — Progress Notes (Signed)
Patient ID: Briana Schroeder, female   DOB: 06-01-1946, 75 y.o.   MRN: [id number]        Chief Complaint: follow up right ear hearing loss, eczema worseinng, allergies, and chronic lbp       HPI:  Briana Schroeder is a 75 y.o. female here with 1 wk onset right hearing loss likely wax without HA, fever, d/c tinnitus or vertigo.  Also has rash to arms with itching just cant stop scatching for several wks, nothing else makes better or worse.  Does have several wks ongoing nasal allergy symptoms with clearish congestion, itch and sneezing, without fever, pain, ST, cough, swelling or wheezing.  Pt continues to have recurring LBP without change in severity, bowel or bladder change, fever, wt loss,  worsening LE pain/numbness/weakness, gait change or falls, and current med tx not working well for pain       Wt Readings from Last 3 Encounters:  03/24/21 137 lb (62.1 kg)  01/23/21 137 lb (62.1 kg)  01/12/21 136 lb (61.7 kg)   BP Readings from Last 3 Encounters:  03/24/21 138/80  01/23/21 138/70  01/12/21 105/75         Past Medical History:  Diagnosis Date   ALLERGIC RHINITIS 08/02/2009   Allergy    ANXIETY 10/18/2008   ARM PAIN, RIGHT 04/04/2009   Arthritis    BACK PAIN 08/02/2009   CHEST PAIN 12/05/2009   COLONIC POLYPS, HX OF 10/18/2008   DIZZINESS 12/05/2009   EPIGASTRIC PAIN 01/09/2010   FATIGUE 09/06/2009   GERD 12/06/2009   HOARSENESS 09/06/2009   HYPERLIPIDEMIA 10/18/2008   HYPERTENSION 10/18/2008   IBS 10/18/2008   MYALGIA 03/21/2009   Osteopenia 12/2018   T score -1.7 FRAX 12% / 2.5% table from prior DEXA 2018   PULMONARY SARCOIDOSIS 10/18/2008   SINUSITIS- ACUTE-NOS 03/21/2009   VOCAL CORD NODULE 09/06/2009   Past Surgical History:  Procedure Laterality Date   ABDOMINAL HYSTERECTOMY  08/1992   TAH-DR G FOR FIBROIDS AND MENORRHAGIA   COLONOSCOPY  11-24-2009   3 polyps, +TA and HPP   hx of vocal cord nodules     s/p right finger tendon surgury     UPPER  GASTROINTESTINAL ENDOSCOPY  01-08-2010   normal    reports that she has never smoked. She has never used smokeless tobacco. She reports that she does not drink alcohol and does not use drugs. family history includes Breast cancer (age of onset: 84) in her mother; Diabetes in her mother; Hypertension in her mother and sister. Allergies  Allergen Reactions   Sertraline Hcl Diarrhea   Current Outpatient Medications on File Prior to Visit  Medication Sig Dispense Refill   amLODipine (NORVASC) 10 MG tablet Take 1 tablet (10 mg total) by mouth daily. 90 tablet 3   atorvastatin (LIPITOR) 10 MG tablet Take 1 tablet by mouth once daily 90 tablet 0   cetirizine (ZYRTEC) 10 MG tablet Take 1 tablet (10 mg total) by mouth daily. 30 tablet 11   hydrochlorothiazide (HYDRODIURIL) 25 MG tablet Take 1 tablet (25 mg total) by mouth daily. 90 tablet 3   Multiple Vitamin (MULTIVITAMIN) tablet Take 1 tablet by mouth daily.     pantoprazole (PROTONIX) 40 MG tablet Take 1 tablet (40 mg total) by mouth daily. 90 tablet 3   potassium chloride (KLOR-CON) 10 MEQ tablet Take 1 tablet by mouth once a day 90 tablet 3   traMADol (ULTRAM) 50 MG tablet TAKE 1 TABLET BY MOUTH EVERY 6 HOURS  AS NEEDED 30 tablet 2   VITAMIN D, CHOLECALCIFEROL, PO Take 5,000 Units by mouth daily.     No current facility-administered medications on file prior to visit.        ROS:  All others reviewed and negative.  Objective        PE:  BP 138/80 (BP Location: Right Arm, Patient Position: Sitting, Cuff Size: Normal)   Pulse 79   Temp 98.5 F (36.9 C) (Oral)   Ht 5\' 3"  (1.6 m)   Wt 137 lb (62.1 kg)   SpO2 98%   BMI 24.27 kg/m                 Constitutional: Pt appears in NAD               HENT: Head: NCAT.                Right Ear: External ear normal.                 Left Ear: External ear normal.                Eyes: . Pupils are equal, round, and reactive to light. Conjunctivae and EOM are normal; Bilat tm's with mild erythema.   Max sinus areas none tender.  Pharynx with mild erythema, no exudate               Nose: without d/c or deformity               Neck: Neck supple. Gross normal ROM               Cardiovascular: Normal rate and regular rhythm.                 Pulmonary/Chest: Effort normal and breath sounds without rales or wheezing.                Abd:  Soft, NT, ND, + BS, no organomegaly               Neurological: Pt is alert. At baseline orientation, motor grossly intact               Skin: Skin is warm. + eczema rash to arms, no other new lesions, LE edema - none               Psychiatric: Pt behavior is normal without agitation   Micro: none  Cardiac tracings I have personally interpreted today:  none  Pertinent Radiological findings (summarize): none   Lab Results  Component Value Date   WBC 5.1 08/22/2020   HGB 13.3 08/22/2020   HCT 39.9 08/22/2020   PLT 244.0 08/22/2020   GLUCOSE 54 (L) 08/22/2020   CHOL 195 08/22/2020   TRIG 98.0 08/22/2020   HDL 87.40 08/22/2020   LDLDIRECT 83.3 11/17/2012   LDLCALC 88 08/22/2020   ALT 33 08/22/2020   AST 40 (H) 08/22/2020   NA 141 08/22/2020   K 3.8 08/22/2020   CL 102 08/22/2020   CREATININE 0.86 08/22/2020   BUN 24 (H) 08/22/2020   CO2 31 08/22/2020   TSH 1.11 08/22/2020   HGBA1C 6.4 08/22/2020   Assessment/Plan:  Briana Schroeder is a 75 y.o. Black or African American [2] female with  has a past medical history of ALLERGIC RHINITIS (08/02/2009), Allergy, ANXIETY (10/18/2008), ARM PAIN, RIGHT (04/04/2009), Arthritis, BACK PAIN (08/02/2009), CHEST PAIN (12/05/2009), COLONIC POLYPS, HX OF (10/18/2008), DIZZINESS (12/05/2009), EPIGASTRIC PAIN (01/09/2010), FATIGUE (09/06/2009),  GERD (12/06/2009), HOARSENESS (09/06/2009), HYPERLIPIDEMIA (10/18/2008), HYPERTENSION (10/18/2008), IBS (10/18/2008), MYALGIA (03/21/2009), Osteopenia (12/2018), PULMONARY SARCOIDOSIS (10/18/2008), SINUSITIS- ACUTE-NOS (03/21/2009), and VOCAL CORD NODULE  (09/06/2009).  Pruritic rash Ok for steroid cream prn,  to f/u any worsening symptoms or concerns  Allergic rhinitis Mild for restart zyrtec prn,  to f/u any worsening symptoms or concerns   Low back pain Chronic uncontrolled, for tramadol prn,  to f/u any worsening symptoms or concerns  Bilateral impacted cerumen Right ear only, resolved with irrigation, hearing improved  Ceruminosis is noted.  Wax is removed by syringing and manual debridement. Instructions for home care to prevent wax buildup are given.  Followup: Return if symptoms worsen or fail to improve.  Cathlean Cower, MD 03/27/2021 9:20 PM [facility]

## 2021-03-24 NOTE — Progress Notes (Signed)
Patient consent obtained. Irrigation with water and peroxide performed. Full view of tympanic membranes after procedure.  Patient tolerated procedure well.   

## 2021-03-27 NOTE — Assessment & Plan Note (Signed)
Chronic uncontrolled, for tramadol prn,  to f/u any worsening symptoms or concerns

## 2021-03-27 NOTE — Assessment & Plan Note (Addendum)
Right ear only, resolved with irrigation, hearing improved  Ceruminosis is noted.  Wax is removed by syringing and manual debridement. Instructions for home care to prevent wax buildup are given.

## 2021-03-27 NOTE — Assessment & Plan Note (Signed)
Mild for restart zyrtec prn,  to f/u any worsening symptoms or concerns

## 2021-03-27 NOTE — Assessment & Plan Note (Signed)
Anthon for steroid cream prn,  to f/u any worsening symptoms or concerns

## 2021-04-10 ENCOUNTER — Other Ambulatory Visit: Payer: Self-pay | Admitting: Internal Medicine

## 2021-04-10 NOTE — Telephone Encounter (Signed)
Please refill as per office routine med refill policy (all routine meds to be refilled for 3 mo or monthly (per pt preference) up to one year from last visit, then month to month grace period for 3 mo, then further med refills will have to be denied) ? ?

## 2021-04-12 ENCOUNTER — Other Ambulatory Visit: Payer: Self-pay

## 2021-04-12 ENCOUNTER — Ambulatory Visit (INDEPENDENT_AMBULATORY_CARE_PROVIDER_SITE_OTHER): Payer: Medicare Other | Admitting: Internal Medicine

## 2021-04-12 ENCOUNTER — Encounter: Payer: Self-pay | Admitting: Internal Medicine

## 2021-04-12 VITALS — BP 112/60 | HR 68 | Temp 99.1°F | Ht 63.0 in | Wt 135.0 lb

## 2021-04-12 DIAGNOSIS — J301 Allergic rhinitis due to pollen: Secondary | ICD-10-CM

## 2021-04-12 DIAGNOSIS — H6982 Other specified disorders of Eustachian tube, left ear: Secondary | ICD-10-CM | POA: Insufficient documentation

## 2021-04-12 DIAGNOSIS — I10 Essential (primary) hypertension: Secondary | ICD-10-CM | POA: Diagnosis not present

## 2021-04-12 DIAGNOSIS — R739 Hyperglycemia, unspecified: Secondary | ICD-10-CM

## 2021-04-12 DIAGNOSIS — H6992 Unspecified Eustachian tube disorder, left ear: Secondary | ICD-10-CM | POA: Insufficient documentation

## 2021-04-12 MED ORDER — AMLODIPINE BESYLATE 10 MG PO TABS: 10.0000 mg | ORAL_TABLET | Freq: Every day | ORAL | 3 refills | Status: DC

## 2021-04-12 MED ORDER — GUAIFENESIN ER 600 MG PO TB12: 1200.0000 mg | ORAL_TABLET | Freq: Two times a day (BID) | ORAL | 5 refills | Status: DC | PRN

## 2021-04-12 MED ORDER — TRIAMCINOLONE ACETONIDE 55 MCG/ACT NA AERO: 2.0000 | INHALATION_SPRAY | Freq: Every day | NASAL | 12 refills | Status: DC

## 2021-04-12 MED ORDER — PANTOPRAZOLE SODIUM 40 MG PO TBEC: 40.0000 mg | DELAYED_RELEASE_TABLET | Freq: Every day | ORAL | 3 refills | Status: DC

## 2021-04-12 MED ORDER — POTASSIUM CHLORIDE ER 10 MEQ PO TBCR: EXTENDED_RELEASE_TABLET | ORAL | 3 refills | Status: DC

## 2021-04-12 MED ORDER — FEXOFENADINE HCL 180 MG PO TABS: 180.0000 mg | ORAL_TABLET | Freq: Every day | ORAL | 11 refills | Status: DC

## 2021-04-12 MED ORDER — TRAMADOL HCL 50 MG PO TABS: 50.0000 mg | ORAL_TABLET | Freq: Four times a day (QID) | ORAL | 5 refills | Status: DC | PRN

## 2021-04-12 MED ORDER — HYDROCHLOROTHIAZIDE 25 MG PO TABS: 25.0000 mg | ORAL_TABLET | Freq: Every day | ORAL | 3 refills | Status: DC

## 2021-04-12 NOTE — Progress Notes (Signed)
Patient ID: Briana Schroeder, female   DOB: 1945-08-19, 75 y.o.   MRN: [id number]        Chief Complaint: follow up HTN, and hyperglycemia ,, allergies       HPI:  Briana Schroeder is a 75 y.o. female here overall doing ok, but Does have several wks ongoing nasal allergy symptoms with clearish congestion, itch and sneezing, without fever, pain, ST, cough, swelling or wheezing, but does also lead to left ear muffled hearing at times with popping.  Pt denies chest pain, increased sob or doe, wheezing, orthopnea, PND, increased LE swelling, palpitations, dizziness or syncope   Pt denies polydipsia, polyuria, or new focal neuro s/s.   Pt denies fever, wt loss, night sweats, loss of appetite, or other constitutional symptoms          Wt Readings from Last 3 Encounters:  04/12/21 135 lb (61.2 kg)  03/24/21 137 lb (62.1 kg)  01/23/21 137 lb (62.1 kg)   BP Readings from Last 3 Encounters:  04/12/21 112/60  03/24/21 138/80  01/23/21 138/70         Past Medical History:  Diagnosis Date   ALLERGIC RHINITIS 08/02/2009   Allergy    ANXIETY 10/18/2008   ARM PAIN, RIGHT 04/04/2009   Arthritis    BACK PAIN 08/02/2009   CHEST PAIN 12/05/2009   COLONIC POLYPS, HX OF 10/18/2008   DIZZINESS 12/05/2009   EPIGASTRIC PAIN 01/09/2010   FATIGUE 09/06/2009   GERD 12/06/2009   HOARSENESS 09/06/2009   HYPERLIPIDEMIA 10/18/2008   HYPERTENSION 10/18/2008   IBS 10/18/2008   MYALGIA 03/21/2009   Osteopenia 12/2018   T score -1.7 FRAX 12% / 2.5% table from prior DEXA 2018   PULMONARY SARCOIDOSIS 10/18/2008   SINUSITIS- ACUTE-NOS 03/21/2009   VOCAL CORD NODULE 09/06/2009   Past Surgical History:  Procedure Laterality Date   ABDOMINAL HYSTERECTOMY  08/1992   TAH-DR G FOR FIBROIDS AND MENORRHAGIA   COLONOSCOPY  11-24-2009   3 polyps, +TA and HPP   hx of vocal cord nodules     s/p right finger tendon surgury     UPPER GASTROINTESTINAL ENDOSCOPY  01-08-2010   normal    reports that she has never  smoked. She has never used smokeless tobacco. She reports that she does not drink alcohol and does not use drugs. family history includes Breast cancer (age of onset: 19) in her mother; Diabetes in her mother; Hypertension in her mother and sister. Allergies  Allergen Reactions   Sertraline Hcl Diarrhea   Current Outpatient Medications on File Prior to Visit  Medication Sig Dispense Refill   atorvastatin (LIPITOR) 10 MG tablet Take 1 tablet by mouth once daily 90 tablet 0   Multiple Vitamin (MULTIVITAMIN) tablet Take 1 tablet by mouth daily.     VITAMIN D, CHOLECALCIFEROL, PO Take 5,000 Units by mouth daily.     No current facility-administered medications on file prior to visit.        ROS:  All others reviewed and negative.  Objective        PE:  BP 112/60 (BP Location: Right Arm, Patient Position: Sitting, Cuff Size: Normal)   Pulse 68   Temp 99.1 F (37.3 C) (Oral)   Ht 5\' 3"  (1.6 m)   Wt 135 lb (61.2 kg)   SpO2 99%   BMI 23.91 kg/m                 Constitutional: Pt appears in NAD  HENT: Head: NCAT.                Right Ear: External ear normal.                 Left Ear: External ear normal. Left TM with mild erythema, small effusion               Eyes: . Pupils are equal, round, and reactive to light. Conjunctivae and EOM are normal               Nose: without d/c or deformity               Neck: Neck supple. Gross normal ROM               Cardiovascular: Normal rate and regular rhythm.                 Pulmonary/Chest: Effort normal and breath sounds without rales or wheezing.                Abd:  Soft, NT, ND, + BS, no organomegaly               Neurological: Pt is alert. At baseline orientation, motor grossly intact               Skin: Skin is warm. No rashes, no other new lesions, LE edema - none               Psychiatric: Pt behavior is normal without agitation   Micro: none  Cardiac tracings I have personally interpreted today:  none  Pertinent  Radiological findings (summarize): none   Lab Results  Component Value Date   WBC 5.1 08/22/2020   HGB 13.3 08/22/2020   HCT 39.9 08/22/2020   PLT 244.0 08/22/2020   GLUCOSE 54 (L) 08/22/2020   CHOL 195 08/22/2020   TRIG 98.0 08/22/2020   HDL 87.40 08/22/2020   LDLDIRECT 83.3 11/17/2012   LDLCALC 88 08/22/2020   ALT 33 08/22/2020   AST 40 (H) 08/22/2020   NA 141 08/22/2020   K 3.8 08/22/2020   CL 102 08/22/2020   CREATININE 0.86 08/22/2020   BUN 24 (H) 08/22/2020   CO2 31 08/22/2020   TSH 1.11 08/22/2020   HGBA1C 6.4 08/22/2020   Assessment/Plan:  Briana Schroeder is a 75 y.o. Black or African American [2] female with  has a past medical history of ALLERGIC RHINITIS (08/02/2009), Allergy, ANXIETY (10/18/2008), ARM PAIN, RIGHT (04/04/2009), Arthritis, BACK PAIN (08/02/2009), CHEST PAIN (12/05/2009), COLONIC POLYPS, HX OF (10/18/2008), DIZZINESS (12/05/2009), EPIGASTRIC PAIN (01/09/2010), FATIGUE (09/06/2009), GERD (12/06/2009), HOARSENESS (09/06/2009), HYPERLIPIDEMIA (10/18/2008), HYPERTENSION (10/18/2008), IBS (10/18/2008), MYALGIA (03/21/2009), Osteopenia (12/2018), PULMONARY SARCOIDOSIS (10/18/2008), SINUSITIS- ACUTE-NOS (03/21/2009), and VOCAL CORD NODULE (09/06/2009).  Allergic rhinitis Mild to mod, for allegra and nasacort asd,  to f/u any worsening symptoms prn  Essential hypertension BP Readings from Last 3 Encounters:  04/12/21 112/60  03/24/21 138/80  01/23/21 138/70   Stable, pt to continue medical treatment norvasc, hct   Hyperglycemia Lab Results  Component Value Date   HGBA1C 6.4 08/22/2020   Stable, pt to continue current medical treatment  - diet   Acute dysfunction of left eustachian tube Also for mucinex bid prn,  to f/u any worsening symptoms or concerns  Followup: No follow-ups on file.  Cathlean Cower, MD 04/15/2021 6:51 PM [facility]

## 2021-04-12 NOTE — Patient Instructions (Signed)
Please take all new medication as prescribed - the allegra, nasacort and mucinex  Ok to hold on the zyrtec if you take the allegra  Please continue all other medications as before, and refills have been done if requested.  Please have the pharmacy call with any other refills you may need.  Please continue your efforts at being more active, low cholesterol diet, and weight control.  You are otherwise up to date with prevention measures today.  Please keep your appointments with your specialists as you may have planned  Please make an Appointment to return in 6 months, or sooner if needed

## 2021-04-15 NOTE — Assessment & Plan Note (Signed)
BP Readings from Last 3 Encounters:  04/12/21 112/60  03/24/21 138/80  01/23/21 138/70   Stable, pt to continue medical treatment norvasc, hct

## 2021-04-15 NOTE — Assessment & Plan Note (Signed)
Also for mucinex bid prn,  to f/u any worsening symptoms or concerns 

## 2021-04-15 NOTE — Assessment & Plan Note (Addendum)
Mild to mod, for allegra and nasacort asd,  to f/u any worsening symptoms prn

## 2021-04-15 NOTE — Assessment & Plan Note (Signed)
Lab Results  Component Value Date   HGBA1C 6.4 08/22/2020   Stable, pt to continue current medical treatment  - diet

## 2021-05-03 ENCOUNTER — Other Ambulatory Visit: Payer: Self-pay | Admitting: Internal Medicine

## 2021-05-08 ENCOUNTER — Other Ambulatory Visit: Payer: Self-pay | Admitting: Internal Medicine

## 2021-05-12 ENCOUNTER — Encounter: Payer: Self-pay | Admitting: Internal Medicine

## 2021-05-12 ENCOUNTER — Other Ambulatory Visit: Payer: Self-pay

## 2021-05-12 ENCOUNTER — Ambulatory Visit (INDEPENDENT_AMBULATORY_CARE_PROVIDER_SITE_OTHER): Payer: Medicare Other | Admitting: Internal Medicine

## 2021-05-12 VITALS — BP 120/70 | HR 89 | Ht 63.0 in | Wt 133.0 lb

## 2021-05-12 DIAGNOSIS — R011 Cardiac murmur, unspecified: Secondary | ICD-10-CM | POA: Diagnosis not present

## 2021-05-12 DIAGNOSIS — R739 Hyperglycemia, unspecified: Secondary | ICD-10-CM

## 2021-05-12 DIAGNOSIS — I10 Essential (primary) hypertension: Secondary | ICD-10-CM | POA: Diagnosis not present

## 2021-05-12 DIAGNOSIS — M5416 Radiculopathy, lumbar region: Secondary | ICD-10-CM | POA: Diagnosis not present

## 2021-05-12 MED ORDER — TIZANIDINE HCL 2 MG PO CAPS: 2.0000 mg | ORAL_CAPSULE | Freq: Three times a day (TID) | ORAL | 1 refills | Status: DC

## 2021-05-12 MED ORDER — TRAMADOL HCL 50 MG PO TABS: 50.0000 mg | ORAL_TABLET | Freq: Four times a day (QID) | ORAL | 5 refills | Status: DC | PRN

## 2021-05-12 MED ORDER — PREDNISONE 10 MG PO TABS: ORAL_TABLET | ORAL | 0 refills | Status: DC

## 2021-05-12 NOTE — Progress Notes (Signed)
Patient ID: Briana Schroeder, female   DOB: Mar 13, 1946, 75 y.o.   MRN: [id number]        Chief Complaint: follow up left lower back pain, new heart murmur, htn, hyperglyemia       HPI:  Briana Schroeder is a 75 y.o. female here with c/o 2 wk onset new flared worsening left lower back pain, sharp and dull, constant, mostly moderate but occasionally severe, has hx of left sciatica but this time more symptomatic and assoc with pain to the distal LLE, numbness, and mild to mod weakness.  Overall worse first in the AM to get OOB, seems somewhat better to stand upright and walk about, some better then later in the day, gait is mildly different and slower, but no bowel or bladder change, fever, wt loss, or falls.  Pt denies chest pain, increased sob or doe, wheezing, orthopnea, PND, increased LE swelling, palpitations, dizziness or syncope.   Pt denies polydipsia, polyuria, or new focal neuro s/s.  Has no hx of prior known heart murmur   No other new complaints       Wt Readings from Last 3 Encounters:  05/12/21 133 lb (60.3 kg)  04/12/21 135 lb (61.2 kg)  03/24/21 137 lb (62.1 kg)   BP Readings from Last 3 Encounters:  05/12/21 120/70  04/12/21 112/60  03/24/21 138/80         Past Medical History:  Diagnosis Date   ALLERGIC RHINITIS 08/02/2009   Allergy    ANXIETY 10/18/2008   ARM PAIN, RIGHT 04/04/2009   Arthritis    BACK PAIN 08/02/2009   CHEST PAIN 12/05/2009   COLONIC POLYPS, HX OF 10/18/2008   DIZZINESS 12/05/2009   EPIGASTRIC PAIN 01/09/2010   FATIGUE 09/06/2009   GERD 12/06/2009   HOARSENESS 09/06/2009   HYPERLIPIDEMIA 10/18/2008   HYPERTENSION 10/18/2008   IBS 10/18/2008   MYALGIA 03/21/2009   Osteopenia 12/2018   T score -1.7 FRAX 12% / 2.5% table from prior DEXA 2018   PULMONARY SARCOIDOSIS 10/18/2008   SINUSITIS- ACUTE-NOS 03/21/2009   VOCAL CORD NODULE 09/06/2009   Past Surgical History:  Procedure Laterality Date   ABDOMINAL HYSTERECTOMY  08/1992   TAH-DR G FOR  FIBROIDS AND MENORRHAGIA   COLONOSCOPY  11-24-2009   3 polyps, +TA and HPP   hx of vocal cord nodules     s/p right finger tendon surgury     UPPER GASTROINTESTINAL ENDOSCOPY  01-08-2010   normal    reports that she has never smoked. She has never used smokeless tobacco. She reports that she does not drink alcohol and does not use drugs. family history includes Breast cancer (age of onset: 17) in her mother; Diabetes in her mother; Hypertension in her mother and sister. Allergies  Allergen Reactions   Sertraline Hcl Diarrhea   Current Outpatient Medications on File Prior to Visit  Medication Sig Dispense Refill   amLODipine (NORVASC) 10 MG tablet Take 1 tablet (10 mg total) by mouth daily. 90 tablet 3   atorvastatin (LIPITOR) 10 MG tablet Take 1 tablet by mouth once daily 90 tablet 0   fexofenadine (ALLEGRA) 180 MG tablet Take 1 tablet (180 mg total) by mouth daily. 30 tablet 11   guaiFENesin (MUCINEX) 600 MG 12 hr tablet Take 2 tablets (1,200 mg total) by mouth 2 (two) times daily as needed. 60 tablet 5   hydrochlorothiazide (HYDRODIURIL) 25 MG tablet Take 1 tablet (25 mg total) by mouth daily. 90 tablet 3   Multiple Vitamin (  MULTIVITAMIN) tablet Take 1 tablet by mouth daily.     pantoprazole (PROTONIX) 40 MG tablet Take 1 tablet (40 mg total) by mouth daily. 90 tablet 3   potassium chloride (KLOR-CON) 10 MEQ tablet Take 1 tablet by mouth once a day 90 tablet 3   triamcinolone (NASACORT) 55 MCG/ACT AERO nasal inhaler Place 2 sprays into the nose daily. 1 each 12   VITAMIN D, CHOLECALCIFEROL, PO Take 5,000 Units by mouth daily.     No current facility-administered medications on file prior to visit.        ROS:  All others reviewed and negative.  Objective        PE:  BP 120/70 (BP Location: Left Arm, Patient Position: Sitting, Cuff Size: Normal)   Pulse 89   Ht 5\' 3"  (1.6 m)   Wt 133 lb (60.3 kg)   SpO2 98%   BMI 23.56 kg/m                 Constitutional: Pt appears in NAD                HENT: Head: NCAT.                Right Ear: External ear normal.                 Left Ear: External ear normal.                Eyes: . Pupils are equal, round, and reactive to light. Conjunctivae and EOM are normal               Nose: without d/c or deformity               Neck: Neck supple. Gross normal ROM               Cardiovascular: Normal rate and regular rhythm. With gr 2-3/6 sys murmur RUSB                Pulmonary/Chest: Effort normal and breath sounds without rales or wheezing.                Abd:  Soft, NT, ND, + BS, no organomegaly               Neurological: Pt is alert. At baseline orientation, motor 3+/5 LLE weakness, with reduced sensation to LLE to LT, spine nontender in the midline               Skin: Skin is warm. No rashes, no other new lesions, LE edema - none               Psychiatric: Pt behavior is normal without agitation   Micro: none  Cardiac tracings I have personally interpreted today:  none  Pertinent Radiological findings (summarize): none   Lab Results  Component Value Date   WBC 5.1 08/22/2020   HGB 13.3 08/22/2020   HCT 39.9 08/22/2020   PLT 244.0 08/22/2020   GLUCOSE 54 (L) 08/22/2020   CHOL 195 08/22/2020   TRIG 98.0 08/22/2020   HDL 87.40 08/22/2020   LDLDIRECT 83.3 11/17/2012   LDLCALC 88 08/22/2020   ALT 33 08/22/2020   AST 40 (H) 08/22/2020   NA 141 08/22/2020   K 3.8 08/22/2020   CL 102 08/22/2020   CREATININE 0.86 08/22/2020   BUN 24 (H) 08/22/2020   CO2 31 08/22/2020   TSH 1.11 08/22/2020   HGBA1C 6.4 08/22/2020   Assessment/Plan:  Briana Schroeder is a 75 y.o. Black or African American [2] female with  has a past medical history of ALLERGIC RHINITIS (08/02/2009), Allergy, ANXIETY (10/18/2008), ARM PAIN, RIGHT (04/04/2009), Arthritis, BACK PAIN (08/02/2009), CHEST PAIN (12/05/2009), COLONIC POLYPS, HX OF (10/18/2008), DIZZINESS (12/05/2009), EPIGASTRIC PAIN (01/09/2010), FATIGUE (09/06/2009), GERD (12/06/2009),  HOARSENESS (09/06/2009), HYPERLIPIDEMIA (10/18/2008), HYPERTENSION (10/18/2008), IBS (10/18/2008), MYALGIA (03/21/2009), Osteopenia (12/2018), PULMONARY SARCOIDOSIS (10/18/2008), SINUSITIS- ACUTE-NOS (03/21/2009), and VOCAL CORD NODULE (09/06/2009).  Essential hypertension BP Readings from Last 3 Encounters:  05/12/21 120/70  04/12/21 112/60  03/24/21 138/80   Stable, pt to continue medical treatment amlodipine   Hyperglycemia Lab Results  Component Value Date   HGBA1C 6.4 08/22/2020   Stable, pt to continue current medical treatment  - diet   Left lumbar radiculopathy Mild to mod new worsening, for pain control, predpac asd muscle relaxer prn trial,  MRI LS spine and consider refer NS, declines for now - pt to return in 2 wks if not improving  Heart murmur Newly noted ? AS type valve d/o - for echo  Followup: Return if symptoms worsen or fail to improve.  Cathlean Cower, MD 05/13/2021 2:43 PM [facility]

## 2021-05-12 NOTE — Patient Instructions (Signed)
Please take all new medication as prescribed - the pain medication (tramadol), prednisone, and muscle relaxer if needed  Please continue all other medications as before, and refills have been done if requested.  Please have the pharmacy call with any other refills you may need.  Please continue your efforts at being more active, low cholesterol diet, and weight control.  Please keep your appointments with your specialists as you may have planned  You will be contacted regarding the referral for: Echocardiogram  Please make an Appointment to return in 2 weeks if not improving

## 2021-05-13 ENCOUNTER — Encounter: Payer: Self-pay | Admitting: Internal Medicine

## 2021-05-13 NOTE — Assessment & Plan Note (Signed)
Newly noted ? AS type valve d/o - for echo

## 2021-05-13 NOTE — Assessment & Plan Note (Signed)
Lab Results  Component Value Date   HGBA1C 6.4 08/22/2020   Stable, pt to continue current medical treatment  - diet

## 2021-05-13 NOTE — Assessment & Plan Note (Addendum)
Mild to mod new worsening, for pain control, predpac asd muscle relaxer prn trial,  MRI LS spine and consider refer NS, declines for now - pt to return in 2 wks if not improving

## 2021-05-13 NOTE — Assessment & Plan Note (Signed)
BP Readings from Last 3 Encounters:  05/12/21 120/70  04/12/21 112/60  03/24/21 138/80   Stable, pt to continue medical treatment amlodipine

## 2021-05-22 ENCOUNTER — Other Ambulatory Visit: Payer: Self-pay | Admitting: Internal Medicine

## 2021-05-22 NOTE — Telephone Encounter (Signed)
Please refill as per office routine med refill policy (all routine meds to be refilled for 3 mo or monthly (per pt preference) up to one year from last visit, then month to month grace period for 3 mo, then further med refills will have to be denied) ? ?

## 2021-05-26 ENCOUNTER — Other Ambulatory Visit: Payer: Self-pay | Admitting: Internal Medicine

## 2021-05-26 NOTE — Telephone Encounter (Signed)
Please refill as per office routine med refill policy (all routine meds to be refilled for 3 mo or monthly (per pt preference) up to one year from last visit, then month to month grace period for 3 mo, then further med refills will have to be denied) ? ?

## 2021-05-30 ENCOUNTER — Other Ambulatory Visit: Payer: Self-pay | Admitting: Internal Medicine

## 2021-05-30 NOTE — Telephone Encounter (Signed)
Please refill as per office routine med refill policy (all routine meds to be refilled for 3 mo or monthly (per pt preference) up to one year from last visit, then month to month grace period for 3 mo, then further med refills will have to be denied) ? ?

## 2021-06-05 ENCOUNTER — Encounter: Payer: Self-pay | Admitting: Internal Medicine

## 2021-06-05 ENCOUNTER — Ambulatory Visit (HOSPITAL_COMMUNITY)
Admission: RE | Admit: 2021-06-05 | Discharge: 2021-06-05 | Disposition: A | Discharge status: Home / Self Care | Payer: Medicare Other | Source: Ambulatory Visit | Attending: Internal Medicine | Admitting: Internal Medicine

## 2021-06-05 ENCOUNTER — Other Ambulatory Visit: Payer: Self-pay

## 2021-06-05 DIAGNOSIS — R011 Cardiac murmur, unspecified: Secondary | ICD-10-CM | POA: Diagnosis not present

## 2021-06-05 DIAGNOSIS — I10 Essential (primary) hypertension: Secondary | ICD-10-CM | POA: Diagnosis not present

## 2021-06-05 DIAGNOSIS — E785 Hyperlipidemia, unspecified: Secondary | ICD-10-CM | POA: Insufficient documentation

## 2021-06-05 LAB — ECHOCARDIOGRAM COMPLETE
Area-P 1/2: 3.72 cm2
Calc EF: 54.7 %
S' Lateral: 3 cm
Single Plane A2C EF: 63 %
Single Plane A4C EF: 47 %

## 2021-06-05 NOTE — Progress Notes (Signed)
  Echocardiogram 2D Echocardiogram has been performed.  Briana Schroeder 06/05/2021, 10:58 AM

## 2021-06-08 ENCOUNTER — Other Ambulatory Visit: Payer: Self-pay

## 2021-06-08 ENCOUNTER — Encounter: Payer: Self-pay | Admitting: Allergy

## 2021-06-08 ENCOUNTER — Ambulatory Visit (INDEPENDENT_AMBULATORY_CARE_PROVIDER_SITE_OTHER): Payer: Medicare Other | Admitting: Allergy

## 2021-06-08 VITALS — BP 108/74 | HR 97 | Temp 98.1°F | Ht 63.0 in | Wt 132.0 lb

## 2021-06-08 DIAGNOSIS — L2082 Flexural eczema: Secondary | ICD-10-CM

## 2021-06-08 DIAGNOSIS — L299 Pruritus, unspecified: Secondary | ICD-10-CM

## 2021-06-08 DIAGNOSIS — J301 Allergic rhinitis due to pollen: Secondary | ICD-10-CM

## 2021-06-08 DIAGNOSIS — H1045 Other chronic allergic conjunctivitis: Secondary | ICD-10-CM | POA: Diagnosis not present

## 2021-06-08 NOTE — Patient Instructions (Addendum)
-  Environmental allergy panel shows positive to cat, dog and grass pollen -Continue your OTC allergy medication as needed.   Or can use Allegra  -For nasal congestion recommend use of Flonase, Rhinocort or Nasacort.  Use 2 sprays each nostril daily for 1-2 weeks at a time before stopping once nasal congestion improves for maximum benefit.  These are all over-the-counter nasal steroid sprays that help with congestion control -For watery eyes can use Pataday or Alaway 1 drop each eye daily as needed.   -If allergy symptoms are not well controlled with medication management may consider allergen immunotherapy which is a 3 to 5-year process that we trains the allergic part of the immune system to become tolerant to the environmental allergens that you are allergic to.  Thus if you are no longer allergic then you should have less or no symptom development and less or no allergy medication needs.  -For itchy, bumpy rash in elbow crease can use hydrocortisone ointment twice a day as needed.  If this is not effective enough let us know -for itching all over try epiceram moisturizer.  Samples provided.  Let us know if this moisturizer helps with overall itching -after bathing pat dry and then apply moisturizer all over  Follow-up in 6 months or sooner if needed

## 2021-06-08 NOTE — Progress Notes (Signed)
Follow-up Note  RE: Briana Schroeder MRN: [id number] DOB: 04/27/1946 Date of Office Visit: 06/08/2021   History of present illness: Briana Schroeder is a 75 y.o. female presenting today for follow-up of allergic rhinitis with conjunctivitis.  She was last seen in the office on 10/20/2020 by myself.  She still reports itchy/watery eyes and also has itching all over in general.  She also has an area of her elbow crease that she states is itchy and has a rash. She did try Allegra and it does help but states she did try an OTC allergy medication that she states works the best (she does not know the actual medication name at this time).  She takes this OTC medication as needed.  She has Alaway eye drop which she will use as needed.  Review of systems: Review of Systems  Constitutional: Negative.   HENT: Negative.    Eyes:  Positive for itching.  Respiratory: Negative.    Cardiovascular: Negative.   Gastrointestinal: Negative.   Musculoskeletal: Negative.   Skin:  Positive for rash.  Allergic/Immunologic: Negative.   Neurological: Negative.     All other systems negative unless noted above in HPI  Past medical/social/surgical/family history have been reviewed and are unchanged unless specifically indicated below.  No changes  Medication List: Current Outpatient Medications  Medication Sig Dispense Refill   amLODipine (NORVASC) 10 MG tablet Take 1 tablet (10 mg total) by mouth daily. 90 tablet 3   atorvastatin (LIPITOR) 10 MG tablet Take 1 tablet by mouth once daily 90 tablet 0   fexofenadine (ALLEGRA) 180 MG tablet Take 1 tablet (180 mg total) by mouth daily. 30 tablet 11   guaiFENesin (MUCINEX) 600 MG 12 hr tablet Take 2 tablets (1,200 mg total) by mouth 2 (two) times daily as needed. 60 tablet 5   hydrochlorothiazide (HYDRODIURIL) 25 MG tablet Take 1 tablet by mouth once daily 90 tablet 3   Multiple Vitamin (MULTIVITAMIN) tablet Take 1 tablet by mouth daily.     pantoprazole  (PROTONIX) 40 MG tablet Take 1 tablet (40 mg total) by mouth daily. 90 tablet 3   potassium chloride (KLOR-CON) 10 MEQ tablet Take 1 tablet by mouth once a day 90 tablet 3   tizanidine (ZANAFLEX) 2 MG capsule Take 1 capsule (2 mg total) by mouth 3 (three) times daily. 60 capsule 1   traMADol (ULTRAM) 50 MG tablet Take 1 tablet (50 mg total) by mouth every 6 (six) hours as needed. 30 tablet 5   triamcinolone (NASACORT) 55 MCG/ACT AERO nasal inhaler Place 2 sprays into the nose daily. 1 each 12   VITAMIN D, CHOLECALCIFEROL, PO Take 5,000 Units by mouth daily.     No current facility-administered medications for this visit.     Known medication allergies: Allergies  Allergen Reactions   Sertraline Hcl Diarrhea     Physical examination: Blood pressure 108/74, pulse 97, temperature 98.1 F (36.7 C), height 5\' 3"  (1.6 m), weight 132 lb (59.9 kg), SpO2 (!) 16 %.  General: Alert, interactive, in no acute distress. HEENT: PERRLA, TMs pearly gray, turbinates non-edematous without discharge, post-pharynx non erythematous. Neck: Supple without lymphadenopathy. Lungs: Clear to auscultation without wheezing, rhonchi or rales. {no increased work of breathing. CV: Normal S1, S2 without murmurs. Abdomen: Nondistended, nontender. Skin: Right antecubital fossa with mild hyperpigmentation and papules . Extremities:  No clubbing, cyanosis or edema. Neuro:   Grossly intact.  Diagnositics/Labs: Labs:  Component     Latest Ref Rng &  Units 10/20/2020  IgE (Immunoglobulin E), Serum     6 - 495 IU/mL 34  D Pteronyssinus IgE     Class 0 kU/L <0.10  D Farinae IgE     Class 0 kU/L <0.10  Cat Dander IgE     Class I kU/L 0.49 (A)  Dog Dander IgE     Class 0/I kU/L 0.17 (A)  [place] Grass IgE     Class 0 kU/L <0.10  Timothy Grass IgE     Class 0/I kU/L 0.15 (A)  Johnson Grass IgE     Class 0 kU/L <0.10  Cockroach, German IgE     Class 0 kU/L <0.10  Penicillium Chrysogen IgE     Class 0 kU/L <0.10   Cladosporium Herbarum IgE     Class 0 kU/L <0.10  Aspergillus Fumigatus IgE     Class 0 kU/L <0.10  Alternaria Alternata IgE     Class 0 kU/L <0.10  Maple/Box Elder IgE     Class 0 kU/L <0.10  Common Silver Wendee Copp IgE     Class 0 kU/L <0.10  Cedar, [place] IgE     Class 0 kU/L <0.10  Oak, White IgE     Class 0 kU/L <0.10  Elm, American IgE     Class 0 kU/L <0.10  Cottonwood IgE     Class 0 kU/L <0.10  Pecan, Hickory IgE     Class 0 kU/L <0.10  White Mulberry IgE     Class 0 kU/L <0.10  Ragweed, Short IgE     Class 0 kU/L <0.10  Pigweed, Rough IgE     Class 0 kU/L <0.10  Sheep Sorrel IgE Qn     Class 0 kU/L <0.10  Mouse Urine IgE     Class 0 kU/L <0.10   Assessment and plan: Allergic rhinitis conjunctivitis  -Environmental allergy panel shows positive to cat, dog and grass pollen -Continue your OTC allergy medication as needed.   Or can use Allegra  -For nasal congestion recommend use of Flonase, Rhinocort or Nasacort.  Use 2 sprays each nostril daily for 1-2 weeks at a time before stopping once nasal congestion improves for maximum benefit.  These are all over-the-counter nasal steroid sprays that help with congestion control -For watery eyes can use Pataday or Alaway 1 drop each eye daily as needed.   -If allergy symptoms are not well controlled with medication management may consider allergen immunotherapy which is a 3 to 5-year process that we trains the allergic part of the immune system to become tolerant to the environmental allergens that you are allergic to.  Thus if you are no longer allergic then you should have less or no symptom development and less or no allergy medication needs.  Pruritus, eczematous dermatitis -For itchy, bumpy rash in elbow crease can use hydrocortisone ointment twice a day as needed.  If this is not effective enough let us know -for itching all over try epiceram moisturizer.  Samples provided.  Let us know if this moisturizer helps with  overall itching -after bathing pat dry and then apply moisturizer all over  Follow-up in 6 months or sooner if needed   I appreciate the opportunity to take part in Briana Schroeder's care. Please do not hesitate to contact me with questions.  Sincerely,   Prudy Feeler, MD Allergy/Immunology [facility]

## 2021-06-20 DIAGNOSIS — Z20822 Contact with and (suspected) exposure to covid-19: Secondary | ICD-10-CM | POA: Diagnosis not present

## 2021-06-30 ENCOUNTER — Other Ambulatory Visit: Payer: Self-pay | Admitting: Internal Medicine

## 2021-06-30 NOTE — Telephone Encounter (Signed)
Please refill as per office routine med refill policy (all routine meds to be refilled for 3 mo or monthly (per pt preference) up to one year from last visit, then month to month grace period for 3 mo, then further med refills will have to be denied) ? ?

## 2021-07-07 DIAGNOSIS — Z1152 Encounter for screening for COVID-19: Secondary | ICD-10-CM | POA: Diagnosis not present

## 2021-07-17 DIAGNOSIS — H25813 Combined forms of age-related cataract, bilateral: Secondary | ICD-10-CM | POA: Diagnosis not present

## 2021-07-18 DIAGNOSIS — Z23 Encounter for immunization: Secondary | ICD-10-CM | POA: Diagnosis not present

## 2021-07-25 ENCOUNTER — Ambulatory Visit (INDEPENDENT_AMBULATORY_CARE_PROVIDER_SITE_OTHER): Payer: Medicare Other

## 2021-07-25 ENCOUNTER — Other Ambulatory Visit: Payer: Self-pay | Admitting: Nurse Practitioner

## 2021-07-25 ENCOUNTER — Other Ambulatory Visit: Payer: Self-pay

## 2021-07-25 DIAGNOSIS — M8589 Other specified disorders of bone density and structure, multiple sites: Secondary | ICD-10-CM | POA: Diagnosis not present

## 2021-07-25 DIAGNOSIS — Z780 Asymptomatic menopausal state: Secondary | ICD-10-CM | POA: Diagnosis not present

## 2021-08-03 ENCOUNTER — Telehealth: Payer: Self-pay

## 2021-08-03 NOTE — Telephone Encounter (Signed)
Patient called for BD test results. I spoke with her and let her know we will be mailing the results out to her. I informed her her result showed osteopenia and relayed to her all of the recommendations. She will call after reviewing her report if she has any questions.

## 2021-08-04 NOTE — Telephone Encounter (Signed)
This is Dr. Quincy Simmonds reviewing patient communication while Dr. Dellis Filbert is out of the office.  Encounter reviewed and closed.

## 2021-08-08 DIAGNOSIS — Z1152 Encounter for screening for COVID-19: Secondary | ICD-10-CM | POA: Diagnosis not present

## 2021-08-09 ENCOUNTER — Other Ambulatory Visit: Payer: Self-pay

## 2021-08-09 ENCOUNTER — Encounter: Payer: Self-pay | Admitting: Internal Medicine

## 2021-08-09 ENCOUNTER — Ambulatory Visit (INDEPENDENT_AMBULATORY_CARE_PROVIDER_SITE_OTHER)
Admission: RE | Admit: 2021-08-09 | Discharge: 2021-08-09 | Disposition: A | Discharge status: Home / Self Care | Payer: Medicare Other | Source: Ambulatory Visit | Attending: Internal Medicine | Admitting: Internal Medicine

## 2021-08-09 ENCOUNTER — Ambulatory Visit (INDEPENDENT_AMBULATORY_CARE_PROVIDER_SITE_OTHER): Payer: Medicare Other | Admitting: Internal Medicine

## 2021-08-09 VITALS — BP 122/68 | HR 68 | Temp 97.1°F | Ht 63.0 in | Wt 127.0 lb

## 2021-08-09 DIAGNOSIS — M25512 Pain in left shoulder: Secondary | ICD-10-CM | POA: Diagnosis not present

## 2021-08-09 DIAGNOSIS — R079 Chest pain, unspecified: Secondary | ICD-10-CM

## 2021-08-09 DIAGNOSIS — E538 Deficiency of other specified B group vitamins: Secondary | ICD-10-CM

## 2021-08-09 DIAGNOSIS — F5101 Primary insomnia: Secondary | ICD-10-CM | POA: Diagnosis not present

## 2021-08-09 DIAGNOSIS — R0781 Pleurodynia: Secondary | ICD-10-CM | POA: Diagnosis not present

## 2021-08-09 DIAGNOSIS — I10 Essential (primary) hypertension: Secondary | ICD-10-CM

## 2021-08-09 DIAGNOSIS — E7800 Pure hypercholesterolemia, unspecified: Secondary | ICD-10-CM

## 2021-08-09 DIAGNOSIS — E559 Vitamin D deficiency, unspecified: Secondary | ICD-10-CM | POA: Diagnosis not present

## 2021-08-09 DIAGNOSIS — R739 Hyperglycemia, unspecified: Secondary | ICD-10-CM

## 2021-08-09 DIAGNOSIS — I700 Atherosclerosis of aorta: Secondary | ICD-10-CM | POA: Diagnosis not present

## 2021-08-09 DIAGNOSIS — G4700 Insomnia, unspecified: Secondary | ICD-10-CM | POA: Insufficient documentation

## 2021-08-09 MED ORDER — AMITRIPTYLINE HCL 100 MG PO TABS: ORAL_TABLET | ORAL | 1 refills | Status: DC

## 2021-08-09 MED ORDER — MELOXICAM 15 MG PO TABS: ORAL_TABLET | ORAL | 1 refills | Status: DC

## 2021-08-09 NOTE — Assessment & Plan Note (Signed)
Today c/w bursitis, for left shoudler film, mobic prn, also consider sport med f/u for any worsening

## 2021-08-09 NOTE — Assessment & Plan Note (Signed)
Exam benign, ?msk  - for rib film and cxr

## 2021-08-09 NOTE — Progress Notes (Signed)
Patient ID: Briana Schroeder, female   DOB: 02/13/1946, 76 y.o.   MRN: [id number]        Chief Complaint: follow up HTN, left chest soreness, hyperglycemia and left shoulder pain/swelling       HPI:  Briana Schroeder is a 76 y.o. female here for yearly f/u with several concerns  Pt denies chest pain, increased sob or doe, wheezing, orthopnea, PND, increased LE swelling, palpitations, dizziness or syncope, except the left lower chest anterolateral area with mild tender for several days without swelling, rash, bruising or trauma. Denies worsening reflux, abd pain, dysphagia, n/v, bowel change or blood.  Does also have left shoulder mild diffuse swelling with mild discomfort and redcued ROM for no particular reason such as fall or overuse per pt; ongoing now for 2-3 wks, mild worse to lie on left side at night, nothing else seems to make better or worse.  Asks for Nsaid prn.   Pt denies polydipsia, polyuria, or new focal neuro s/s except for uncomfortable bothersome mild tingling bilateral toe discomfort at night trying to get to sleep.         Wt Readings from Last 3 Encounters:  08/09/21 127 lb (57.6 kg)  06/08/21 132 lb (59.9 kg)  05/12/21 133 lb (60.3 kg)   BP Readings from Last 3 Encounters:  08/09/21 122/68  06/08/21 108/74  05/12/21 120/70         Past Medical History:  Diagnosis Date   ALLERGIC RHINITIS 08/02/2009   Allergy    ANXIETY 10/18/2008   ARM PAIN, RIGHT 04/04/2009   Arthritis    BACK PAIN 08/02/2009   CHEST PAIN 12/05/2009   COLONIC POLYPS, HX OF 10/18/2008   DIZZINESS 12/05/2009   EPIGASTRIC PAIN 01/09/2010   FATIGUE 09/06/2009   GERD 12/06/2009   HOARSENESS 09/06/2009   HYPERLIPIDEMIA 10/18/2008   HYPERTENSION 10/18/2008   IBS 10/18/2008   MYALGIA 03/21/2009   Osteopenia 12/2018   T score -1.7 FRAX 12% / 2.5% table from prior DEXA 2018   PULMONARY SARCOIDOSIS 10/18/2008   SINUSITIS- ACUTE-NOS 03/21/2009   VOCAL CORD NODULE 09/06/2009   Past Surgical  History:  Procedure Laterality Date   ABDOMINAL HYSTERECTOMY  08/1992   TAH-DR G FOR FIBROIDS AND MENORRHAGIA   COLONOSCOPY  11-24-2009   3 polyps, +TA and HPP   hx of vocal cord nodules     s/p right finger tendon surgury     UPPER GASTROINTESTINAL ENDOSCOPY  01-08-2010   normal    reports that she has never smoked. She has never used smokeless tobacco. She reports that she does not drink alcohol and does not use drugs. family history includes Breast cancer (age of onset: 71) in her mother; Diabetes in her mother; Hypertension in her mother and sister. Allergies  Allergen Reactions   Sertraline Hcl Diarrhea   Current Outpatient Medications on File Prior to Visit  Medication Sig Dispense Refill   amLODipine (NORVASC) 10 MG tablet Take 1 tablet (10 mg total) by mouth daily. 90 tablet 3   atorvastatin (LIPITOR) 10 MG tablet Take 1 tablet by mouth once daily 90 tablet 0   fexofenadine (ALLEGRA) 180 MG tablet Take 1 tablet (180 mg total) by mouth daily. 30 tablet 11   guaiFENesin (MUCINEX) 600 MG 12 hr tablet Take 2 tablets (1,200 mg total) by mouth 2 (two) times daily as needed. 60 tablet 5   hydrochlorothiazide (HYDRODIURIL) 25 MG tablet Take 1 tablet by mouth once daily 90 tablet 3  Multiple Vitamin (MULTIVITAMIN) tablet Take 1 tablet by mouth daily.     pantoprazole (PROTONIX) 40 MG tablet Take 1 tablet by mouth once daily 90 tablet 2   potassium chloride (KLOR-CON) 10 MEQ tablet Take 1 tablet by mouth once a day 90 tablet 3   tizanidine (ZANAFLEX) 2 MG capsule Take 1 capsule (2 mg total) by mouth 3 (three) times daily. 60 capsule 1   traMADol (ULTRAM) 50 MG tablet Take 1 tablet (50 mg total) by mouth every 6 (six) hours as needed. 30 tablet 5   triamcinolone (NASACORT) 55 MCG/ACT AERO nasal inhaler Place 2 sprays into the nose daily. 1 each 12   VITAMIN D, CHOLECALCIFEROL, PO Take 5,000 Units by mouth daily.     No current facility-administered medications on file prior to visit.         ROS:  All others reviewed and negative.  Objective        PE:  BP 122/68    Pulse 68    Temp (!) 97.1 F (36.2 C) (Oral)    Ht 5\' 3"  (1.6 m)    Wt 127 lb (57.6 kg)    SpO2 98%    BMI 22.50 kg/m                 Constitutional: Pt appears in NAD               HENT: Head: NCAT.                Right Ear: External ear normal.                 Left Ear: External ear normal.                Eyes: . Pupils are equal, round, and reactive to light. Conjunctivae and EOM are normal               Nose: without d/c or deformity               Neck: Neck supple. Gross normal ROM               Cardiovascular: Normal rate and regular rhythm.                 Pulmonary/Chest: Effort normal and breath sounds without rales or wheezing.                Abd:  Soft, NT, ND, + BS, no organomegaly               Neurological: Pt is alert. At baseline orientation, motor grossly intact               Skin: Skin is warm. No rashes, no other new lesions, LE edema - none                Left shoulder with diffuse mild swelling with mild tender left subacromial and reduced ROM to 100 degrees                Left lower anterolateral chest wall with mild focal tender between 11 and 12th ribs but no swelling, bruising, rash               Psychiatric: Pt behavior is normal without agitation   Micro: none  Cardiac tracings I have personally interpreted today:  none  Pertinent Radiological findings (summarize): none   Lab Results  Component Value Date   WBC 5.1 08/22/2020  HGB 13.3 08/22/2020   HCT 39.9 08/22/2020   PLT 244.0 08/22/2020   GLUCOSE 54 (L) 08/22/2020   CHOL 195 08/22/2020   TRIG 98.0 08/22/2020   HDL 87.40 08/22/2020   LDLDIRECT 83.3 11/17/2012   LDLCALC 88 08/22/2020   ALT 33 08/22/2020   AST 40 (H) 08/22/2020   NA 141 08/22/2020   K 3.8 08/22/2020   CL 102 08/22/2020   CREATININE 0.86 08/22/2020   BUN 24 (H) 08/22/2020   CO2 31 08/22/2020   TSH 1.11 08/22/2020   HGBA1C 6.4 08/22/2020    Assessment/Plan:  Briana Schroeder is a 76 y.o. Black or African American [2] female with  has a past medical history of ALLERGIC RHINITIS (08/02/2009), Allergy, ANXIETY (10/18/2008), ARM PAIN, RIGHT (04/04/2009), Arthritis, BACK PAIN (08/02/2009), CHEST PAIN (12/05/2009), COLONIC POLYPS, HX OF (10/18/2008), DIZZINESS (12/05/2009), EPIGASTRIC PAIN (01/09/2010), FATIGUE (09/06/2009), GERD (12/06/2009), HOARSENESS (09/06/2009), HYPERLIPIDEMIA (10/18/2008), HYPERTENSION (10/18/2008), IBS (10/18/2008), MYALGIA (03/21/2009), Osteopenia (12/2018), PULMONARY SARCOIDOSIS (10/18/2008), SINUSITIS- ACUTE-NOS (03/21/2009), and VOCAL CORD NODULE (09/06/2009).  Hyperglycemia Lab Results  Component Value Date   HGBA1C 6.4 08/22/2020   Stable, pt to continue current medical treatment  - diet, wt control   Hyperlipidemia Lab Results  Component Value Date   LDLCALC 88 08/22/2020   Mild uncontrolled, goal ldl < 70, pt to continue current statin lipitor 10, declines change for now, for f/u lipids today   Insomnia Mild to mod with neuritic pain at bedtime, for elavil 100 mg- 1/2 - 1 qhs prn, to f/u any worsening symptoms or concerns'=  Aortic atherosclerosis (HCC) Pt to continue liptor, low chol diet, excercise  Essential hypertension BP Readings from Last 3 Encounters:  08/09/21 122/68  06/08/21 108/74  05/12/21 120/70   Stable, pt to continue medical treatment norvasc, hct   Left-sided chest pain Exam benign, ?msk  - for rib film and cxr  Left shoulder pain Today c/w bursitis, for left shoudler film, mobic prn, also consider sport med f/u for any worsening  Followup: No follow-ups on file.  Cathlean Cower, MD 08/09/2021 1:25 PM [facility]

## 2021-08-09 NOTE — Assessment & Plan Note (Signed)
Lab Results  Component Value Date   LDLCALC 88 08/22/2020   Mild uncontrolled, goal ldl < 70, pt to continue current statin lipitor 10, declines change for now, for f/u lipids today

## 2021-08-09 NOTE — Assessment & Plan Note (Signed)
BP Readings from Last 3 Encounters:  08/09/21 122/68  06/08/21 108/74  05/12/21 120/70   Stable, pt to continue medical treatment norvasc, hct

## 2021-08-09 NOTE — Assessment & Plan Note (Signed)
Pt to continue liptor, low chol diet, excercise

## 2021-08-09 NOTE — Assessment & Plan Note (Signed)
Mild to mod with neuritic pain at bedtime, for elavil 100 mg- 1/2 - 1 qhs prn, to f/u any worsening symptoms or concerns'=

## 2021-08-09 NOTE — Assessment & Plan Note (Signed)
Lab Results  Component Value Date   HGBA1C 6.4 08/22/2020   Stable, pt to continue current medical treatment  - diet, wt control

## 2021-08-09 NOTE — Patient Instructions (Signed)
Please take all new medication as prescribed- the anti-inflammatory, and the medication for sleep  Please continue all other medications as before, and refills have been done if requested.  Please have the pharmacy call with any other refills you may need.  Please continue your efforts at being more active, low cholesterol diet, and weight control.  You are otherwise up to date with prevention measures today.  Please keep your appointments with your specialists as you may have planned  Please go to the XRAY Department in the first floor for the x-ray testing - at the [facility] xray tomorrow  Please go to the LAB at the blood drawing area for the tests to be done - at the [facility] will be contacted by phone if any changes need to be made immediately.  Otherwise, you will receive a letter about your results with an explanation, but please check with MyChart first.  Please remember to sign up for MyChart if you have not done so, as this will be important to you in the future with finding out test results, communicating by private email, and scheduling acute appointments online when needed.  Please make an Appointment to return in 6 months, or sooner if needed

## 2021-08-11 ENCOUNTER — Other Ambulatory Visit: Payer: Self-pay | Admitting: Internal Medicine

## 2021-09-07 DIAGNOSIS — Z1152 Encounter for screening for COVID-19: Secondary | ICD-10-CM | POA: Diagnosis not present

## 2021-09-14 ENCOUNTER — Telehealth: Payer: Self-pay | Admitting: Internal Medicine

## 2021-09-14 NOTE — Telephone Encounter (Signed)
Ok this is done 

## 2021-09-14 NOTE — Telephone Encounter (Signed)
Patient calling in ? ?Patient says at last OV that her & provider discussed having visits at Sports Medicine due to some pain in her shoulders  ? ?Would like for provider to submit referral for her  ? ?Please let her know when referral has been placed [phone] ?

## 2021-09-19 NOTE — Progress Notes (Signed)
? ? ?Subjective:   ? ?CC: Bilat shoulder pain ? ?I, Briana Schroeder, LAT, ATC, am serving as scribe for Dr. [name]. ? ?HPI: Pt is a 76 y/o female c/o B shoulder pain ongoing for several months w/ no MOI. Pt was last seen by Dr. Georgina Snell on 07/26/20 for her R shoulder w/ an incomplete RC tear and was given a R subacromial steroid injection. She locates her pain to all over both [place] joints w/ radiating pain into upper arms. ? ?Neck pain: yes ?Radiating pain: yes ?Mechanical shoulder symptoms: no ?Aggravating factors: any shoulder motions ?Treatments tried: meloxicam, tramadol ? ?Diagnostic testing: L shoulder XR- 08/09/21; R shoulder MRI- 07/23/20; R shoulder XR- 04/07/20; R shoulder XR- 04/07/20 ? ?Pertinent review of Systems: No fevers or chills ? ?Relevant historical information: Hypertension.  Osteopenia ? ? ?Objective:   ? ?Vitals:  ? 09/20/21 0950  ?BP: 120/72  ?Pulse: 74  ?SpO2: 98%  ? ?General: Well Developed, well nourished, and in no acute distress.  ? ?MSK: Right shoulder: Normal-appearing ?Nontender. ?Normal motion pain with abduction. ?Strength 4/5 to abduction and external rotation normal to internal rotation. ?Positive Hawkins and Neer's test.  Positive empty can test. ?Negative Yergason's and speeds test. ? ?Left shoulder: Normal-appearing ?Nontender. ?Normal motion. ?Strength 4/5 to abduction normal otherwise. ?Mildly positive Hawkins and Neer's test.  Negative empty can test. ?Negative Yergason's and speeds test. ? ?Lab and Radiology Results ? ?Procedure: Real-time Ultrasound Guided Injection of right shoulder subacromial bursa ?Device: Philips Affiniti 50G ?Images permanently stored and available for review in PACS ?Verbal informed consent obtained.  Discussed risks and benefits of procedure. Warned about infection bleeding damage to structures skin hypopigmentation and fat atrophy among others. ?Patient expresses understanding and agreement ?Time-out conducted.   ?Noted no overlying erythema,  induration, or other signs of local infection.   ?Skin prepped in a sterile fashion.   ?Local anesthesia: Topical Ethyl chloride.   ?With sterile technique and under real time ultrasound guidance: 40 mg of Kenalog and 2 mL of Marcaine injected into subacromial bursa. Fluid seen entering the bursa.   ?Completed without difficulty   ?Pain immediately resolved suggesting accurate placement of the medication.   ?Advised to call if fevers/chills, erythema, induration, drainage, or persistent bleeding.   ?Images permanently stored and available for review in the ultrasound unit.  ?Impression: Technically successful ultrasound guided injection. ? ? ? ?Procedure: Real-time Ultrasound Guided Injection of left shoulder subacromial bursa ?Device: Philips Affiniti 50G ?Images permanently stored and available for review in PACS ?Verbal informed consent obtained.  Discussed risks and benefits of procedure. Warned about infection bleeding damage to structures skin hypopigmentation and fat atrophy among others. ?Patient expresses understanding and agreement ?Time-out conducted.   ?Noted no overlying erythema, induration, or other signs of local infection.   ?Skin prepped in a sterile fashion.   ?Local anesthesia: Topical Ethyl chloride.   ?With sterile technique and under real time ultrasound guidance: 40 mg of Kenalog and 2 mL of Marcaine injected into subacromial bursa. Fluid seen entering the bursa.   ?Completed without difficulty   ?Pain immediately resolved suggesting accurate placement of the medication.   ?Advised to call if fevers/chills, erythema, induration, drainage, or persistent bleeding.   ?Images permanently stored and available for review in the ultrasound unit.  ?Impression: Technically successful ultrasound guided injection. ? ? ? ? ?EXAM: ?MRI OF THE RIGHT SHOULDER WITHOUT CONTRAST ?  ?TECHNIQUE: ?Multiplanar, multisequence MR imaging of the shoulder was performed. ?No intravenous contrast was administered. ?   ?  COMPARISON:  04/07/2020 x-ray ?  ?FINDINGS: ?Rotator cuff: Severe supraspinatus tendinosis with high-grade bursal ?sided tear of the mid to posterior portion of the distal tendon with ?up to 8 mm of tendinous retraction (series 5, images 9-12). Tear ?involves approximately 75% of the tendon depth. Moderate ?infraspinatus tendinosis without discrete tear. Subscapularis and ?teres minor tendons are within normal limits. ?  ?Muscles: Preserved bulk and signal intensity of the rotator cuff ?musculature without edema, atrophy, or fatty infiltration. ?  ?Biceps long head:  Intact. ?  ?Acromioclavicular Joint: Mild-moderate arthropathy of the AC joint. ?Small volume subacromial-subdeltoid bursal fluid. ?  ?Glenohumeral Joint: No joint effusion. No chondral defect. ?  ?Labrum: Intrasubstance T2 signal within the superior labrum just ?posterior to the level of the biceps anchor which could reflect a ?small tear (series 5, image 9). Labral evaluation is limited in the ?absence of intra-articular fluid/contrast. No paralabral cyst. ?  ?Bones: No acute fracture. No dislocation. No bone marrow edema. No ?suspicious bone lesion. ?  ?Other: None. ?  ?IMPRESSION: ?1. Severe supraspinatus tendinosis with high-grade bursal sided tear ?with mild retraction. ?2. Moderate infraspinatus tendinosis without discrete tear. ?3. Findings suspicious for small superior labral tear. ?4. Mild-moderate AC joint osteoarthritis with mild ?subacromial-subdeltoid bursitis. ?  ?  ?Electronically Signed ?  By: Davina Poke D.O. ?  On: 07/23/2020 19:53 ? ?EXAM: ?LEFT SHOULDER - 2+ VIEW ?  ?COMPARISON:  Left shoulder radiographs 06/27/2017, CT chest abdomen ?pelvis 01/06/2010 ?  ?FINDINGS: ?Minimal glenohumeral joint space narrowing and inferior glenoid ?degenerative osteophytosis. Mild acromioclavicular joint space ?narrowing. Within the limitations of diffuse decreased bone ?mineralization no acute fracture is seen. No dislocation. ?Calcifications  are again seen within the mediastinum likely from ?calcified mediastinal lymph nodes representing the sequela of remote ?granulomatous infection. This is similar to prior CT chest. ?  ?IMPRESSION: ?Minimal left glenohumeral osteoarthritis. ?  ?  ?Electronically Signed ?  By: Yvonne Kendall M.D. ?  On: 08/11/2021 07:27 ?  ?I, [name], personally (independently) visualized and performed the interpretation of the images attached in this note. ? ? ? ?Impression and Recommendations:   ? ?Assessment and Plan: ?76 y.o. female with bilateral shoulder pain right worse than left. ?Allene was lost to follow-up from a clinic in January of last year after receiving a subacromial injection to her right shoulder for rotator cuff tendinitis with a partial tear.  This worked really well until a few months ago it seems when her pain returned.  I suspect she probably has a similar thing going on with her left shoulder.  We discussed options.  She like to try an injection again because it works so well.  Plan for bilateral subacromial injections and check back in 6 weeks.  I think a more firm check back will reduce the chance of her being lost to follow-up again. ? ?If not improved would consider physical therapy.. ? ?PDMP not reviewed this encounter. ?Orders Placed This Encounter  ?Procedures  ? [name] LIMITED JOINT SPACE STRUCTURES UP BILAT(NO LINKED CHARGES)  ?  Order Specific Question:   Reason for Exam (SYMPTOM  OR DIAGNOSIS REQUIRED)  ?  Answer:   bl shoulder  ?  Order Specific Question:   Preferred imaging location?  ?  Answer:   [facility]  ? ?No orders of the defined types were placed in this encounter. ? ? ?Discussed warning signs or symptoms. Please see discharge instructions. Patient expresses understanding. ? ? ?The above documentation has been reviewed  and is accurate and complete [name], M.D. ? ?

## 2021-09-20 ENCOUNTER — Ambulatory Visit (INDEPENDENT_AMBULATORY_CARE_PROVIDER_SITE_OTHER): Payer: Medicare Other | Admitting: Family Medicine

## 2021-09-20 ENCOUNTER — Encounter: Payer: Self-pay | Admitting: Family Medicine

## 2021-09-20 ENCOUNTER — Other Ambulatory Visit: Payer: Self-pay

## 2021-09-20 ENCOUNTER — Ambulatory Visit: Payer: Self-pay

## 2021-09-20 VITALS — BP 120/72 | HR 74 | Ht 63.0 in | Wt 120.4 lb

## 2021-09-20 DIAGNOSIS — M25511 Pain in right shoulder: Secondary | ICD-10-CM | POA: Diagnosis not present

## 2021-09-20 DIAGNOSIS — M25512 Pain in left shoulder: Secondary | ICD-10-CM | POA: Diagnosis not present

## 2021-09-20 DIAGNOSIS — G8929 Other chronic pain: Secondary | ICD-10-CM

## 2021-09-20 NOTE — Patient Instructions (Addendum)
Thank you for coming in today.  ? ?You received steroid injections in both of your shoulders today. Seek immediate medical attention if the joint becomes red, extremely painful, or is oozing fluid.  ? ?Recheck back in 6 weeks ?

## 2021-09-23 ENCOUNTER — Other Ambulatory Visit: Payer: Self-pay | Admitting: Internal Medicine

## 2021-09-28 ENCOUNTER — Telehealth: Payer: Self-pay | Admitting: Internal Medicine

## 2021-09-28 NOTE — Telephone Encounter (Signed)
Left message for patient to call back to schedule Medicare Annual Wellness Visit  ? ?Last AWV  11/17/19 ? ?Please schedule at anytime with [facility] if patient calls the office back.  Patient is coming in on Monday 10/02/21 to see PCP, if possible please schedule AWV same day. ? ? ? ?Any questions, please call me at [phone]  ?

## 2021-10-02 ENCOUNTER — Other Ambulatory Visit: Payer: Self-pay | Admitting: Internal Medicine

## 2021-10-02 ENCOUNTER — Ambulatory Visit: Payer: Medicare Other | Admitting: Internal Medicine

## 2021-10-02 ENCOUNTER — Encounter: Payer: Self-pay | Admitting: Internal Medicine

## 2021-10-02 ENCOUNTER — Ambulatory Visit (INDEPENDENT_AMBULATORY_CARE_PROVIDER_SITE_OTHER): Payer: Medicare Other | Admitting: Internal Medicine

## 2021-10-02 VITALS — BP 136/70 | HR 89 | Temp 98.2°F | Ht 63.0 in | Wt 120.0 lb

## 2021-10-02 DIAGNOSIS — R1011 Right upper quadrant pain: Secondary | ICD-10-CM | POA: Diagnosis not present

## 2021-10-02 DIAGNOSIS — R739 Hyperglycemia, unspecified: Secondary | ICD-10-CM | POA: Diagnosis not present

## 2021-10-02 DIAGNOSIS — D649 Anemia, unspecified: Secondary | ICD-10-CM

## 2021-10-02 DIAGNOSIS — E7800 Pure hypercholesterolemia, unspecified: Secondary | ICD-10-CM

## 2021-10-02 DIAGNOSIS — E559 Vitamin D deficiency, unspecified: Secondary | ICD-10-CM

## 2021-10-02 DIAGNOSIS — I10 Essential (primary) hypertension: Secondary | ICD-10-CM

## 2021-10-02 DIAGNOSIS — R634 Abnormal weight loss: Secondary | ICD-10-CM | POA: Diagnosis not present

## 2021-10-02 DIAGNOSIS — E538 Deficiency of other specified B group vitamins: Secondary | ICD-10-CM

## 2021-10-02 DIAGNOSIS — J301 Allergic rhinitis due to pollen: Secondary | ICD-10-CM

## 2021-10-02 DIAGNOSIS — R1013 Epigastric pain: Secondary | ICD-10-CM

## 2021-10-02 LAB — CBC WITH DIFFERENTIAL/PLATELET
Basophils Absolute: 0.1 10*3/uL (ref 0.0–0.1)
Basophils Relative: 0.8 % (ref 0.0–3.0)
Eosinophils Absolute: 0.1 10*3/uL (ref 0.0–0.7)
Eosinophils Relative: 1 % (ref 0.0–5.0)
HCT: 34.4 % — ABNORMAL LOW (ref 36.0–46.0)
Hemoglobin: 11.3 g/dL — ABNORMAL LOW (ref 12.0–15.0)
Lymphocytes Relative: 10.3 % — ABNORMAL LOW (ref 12.0–46.0)
Lymphs Abs: 0.8 10*3/uL (ref 0.7–4.0)
MCHC: 32.8 g/dL (ref 30.0–36.0)
MCV: 88.4 fl (ref 78.0–100.0)
Monocytes Absolute: 0.7 10*3/uL (ref 0.1–1.0)
Monocytes Relative: 8.9 % (ref 3.0–12.0)
Neutro Abs: 5.9 10*3/uL (ref 1.4–7.7)
Neutrophils Relative %: 79 % — ABNORMAL HIGH (ref 43.0–77.0)
Platelets: 313 10*3/uL (ref 150.0–400.0)
RBC: 3.89 Mil/uL (ref 3.87–5.11)
RDW: 15.5 % (ref 11.5–15.5)
WBC: 7.4 10*3/uL (ref 4.0–10.5)

## 2021-10-02 LAB — URINALYSIS, ROUTINE W REFLEX MICROSCOPIC
Bilirubin Urine: NEGATIVE
Hgb urine dipstick: NEGATIVE
Ketones, ur: NEGATIVE
Nitrite: POSITIVE — AB
Specific Gravity, Urine: 1.02 (ref 1.000–1.030)
Total Protein, Urine: NEGATIVE
Urine Glucose: NEGATIVE
Urobilinogen, UA: 0.2 (ref 0.0–1.0)
pH: 6 (ref 5.0–8.0)

## 2021-10-02 LAB — VITAMIN D 25 HYDROXY (VIT D DEFICIENCY, FRACTURES): VITD: 79.71 ng/mL (ref 30.00–100.00)

## 2021-10-02 LAB — HEPATIC FUNCTION PANEL
ALT: 22 U/L (ref 0–35)
AST: 23 U/L (ref 0–37)
Albumin: 4 g/dL (ref 3.5–5.2)
Alkaline Phosphatase: 23 U/L — ABNORMAL LOW (ref 39–117)
Bilirubin, Direct: 0.1 mg/dL (ref 0.0–0.3)
Total Bilirubin: 0.4 mg/dL (ref 0.2–1.2)
Total Protein: 7.1 g/dL (ref 6.0–8.3)

## 2021-10-02 LAB — LIPID PANEL
Cholesterol: 173 mg/dL (ref 0–200)
HDL: 77.6 mg/dL (ref >39.00)
LDL Cholesterol: 83 mg/dL (ref 0–99)
NonHDL: 94.99
Total CHOL/HDL Ratio: 2
Triglycerides: 59 mg/dL (ref 0.0–149.0)
VLDL: 11.8 mg/dL (ref 0.0–40.0)

## 2021-10-02 LAB — C-REACTIVE PROTEIN: CRP: <1 mg/dL (ref 0.5–20.0)

## 2021-10-02 LAB — HEMOGLOBIN A1C: Hgb A1c MFr Bld: 6.7 % — ABNORMAL HIGH (ref 4.6–6.5)

## 2021-10-02 LAB — BASIC METABOLIC PANEL
BUN: 25 mg/dL — ABNORMAL HIGH (ref 6–23)
CO2: 28 mEq/L (ref 19–32)
Calcium: 9.9 mg/dL (ref 8.4–10.5)
Chloride: 103 mEq/L (ref 96–112)
Creatinine, Ser: 0.82 mg/dL (ref 0.40–1.20)
GFR: 69.19 mL/min (ref >60.00)
Glucose, Bld: 106 mg/dL — ABNORMAL HIGH (ref 70–99)
Potassium: 4.2 mEq/L (ref 3.5–5.1)
Sodium: 137 mEq/L (ref 135–145)

## 2021-10-02 LAB — TSH: TSH: 0.66 u[IU]/mL (ref 0.35–5.50)

## 2021-10-02 LAB — SEDIMENTATION RATE: Sed Rate: 61 mm/hr — ABNORMAL HIGH (ref 0–30)

## 2021-10-02 LAB — VITAMIN B12: Vitamin B-12: 980 pg/mL — ABNORMAL HIGH (ref 211–911)

## 2021-10-02 NOTE — Assessment & Plan Note (Signed)
Mild to mod, for otc nasacort asd, to f/u any worsening symptoms or concerns ?

## 2021-10-02 NOTE — Assessment & Plan Note (Signed)
Lab Results  ?Component Value Date  ? HGBA1C 6.7 (H) 10/02/2021  ? ?Stable, pt to continue current medical treatment  - diet ? ?

## 2021-10-02 NOTE — Patient Instructions (Addendum)
Please continue all other medications as before, and refills have been done if requested. ? ?Please have the pharmacy call with any other refills you may need. ? ?Please continue your efforts at being more active, low cholesterol diet. ? ?Please keep your appointments with your specialists as you may have planned ? ?You will be contacted regarding the referral for: CT abd/pelvis ? ?Please go to the LAB at the blood drawing area for the tests to be done ? ?You will be contacted by phone if any changes need to be made immediately.  Otherwise, you will receive a letter about your results with an explanation, but please check with MyChart first. ? ?Please remember to sign up for MyChart if you have not done so, as this will be important to you in the future with finding out test results, communicating by private email, and scheduling acute appointments online when needed. ? ?Please make an Appointment to return in 6 months, or sooner if needed, also with Lab Appointment for testing done 3-5 days before at the [facility] (so this is for TWO appointments - please see the scheduling desk as you leave) ? ?Due to the ongoing Covid 19 pandemic, our lab now requires an appointment for any labs done at our office.  If you need labs done and do not have an appointment, please call our office ahead of time to schedule before presenting to the lab for your testing. ? ? ?

## 2021-10-02 NOTE — Assessment & Plan Note (Signed)
With mild discomfort on exam - for CT abd/pelvis ?

## 2021-10-02 NOTE — Assessment & Plan Note (Signed)
BP Readings from Last 3 Encounters:  ?10/02/21 136/70  ?09/20/21 120/72  ?08/09/21 122/68  ? ?Stable, pt to continue medical treatment norvasc, hct ? ?

## 2021-10-02 NOTE — Progress Notes (Signed)
Patient ID: Briana Schroeder, female   DOB: [birth year], 76 y.o.   MRN: [id number] ? ? ?    Chief Complaint: follow up recent shoulder pain, decreased appetite, wt loss, and allergy eyes and nasal ? ?     HPI:  Briana Schroeder is a 76 y.o. female Does have several wks ongoing nasal and eye allergy symptoms with clearish congestion, itch and sneezing, without fever, pain, ST, cough, swelling or wheezing.  Has lost 7 lbs recently, has been up 140 fairly recently.  Denies worsening depressive symptoms, suicidal ideation, or panic.  Stays busy, tries to sleep but sometimes has some difficulty sleeping. Bilat shoulder resolved for now after cortisone per sport med.   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  Did not have lab work from last visit yet.  Denies worsening reflux, abd pain, dysphagia, n/v, bowel change or blood.    CXR feb 2023 - neg.   ?Wt Readings from Last 3 Encounters:  ?10/02/21 120 lb (54.4 kg)  ?09/20/21 120 lb 6.4 oz (54.6 kg)  ?08/09/21 127 lb (57.6 kg)  ? ?BP Readings from Last 3 Encounters:  ?10/02/21 136/70  ?09/20/21 120/72  ?08/09/21 122/68  ? ?      ?Past Medical History:  ?Diagnosis Date  ? ALLERGIC RHINITIS 08/02/2009  ? Allergy   ? ANXIETY 10/18/2008  ? ARM PAIN, RIGHT 04/04/2009  ? Arthritis   ? BACK PAIN 08/02/2009  ? CHEST PAIN 12/05/2009  ? COLONIC POLYPS, HX OF 10/18/2008  ? DIZZINESS 12/05/2009  ? EPIGASTRIC PAIN 01/09/2010  ? FATIGUE 09/06/2009  ? GERD 12/06/2009  ? HOARSENESS 09/06/2009  ? HYPERLIPIDEMIA 10/18/2008  ? HYPERTENSION 10/18/2008  ? IBS 10/18/2008  ? MYALGIA 03/21/2009  ? Osteopenia 12/2018  ? T score -1.7 FRAX 12% / 2.5% table from prior DEXA 2018  ? PULMONARY SARCOIDOSIS 10/18/2008  ? SINUSITIS- ACUTE-NOS 03/21/2009  ? VOCAL CORD NODULE 09/06/2009  ? ?Past Surgical History:  ?Procedure Laterality Date  ? ABDOMINAL  HYSTERECTOMY  08/1992  ? TAH-DR G FOR FIBROIDS AND MENORRHAGIA  ? COLONOSCOPY  11-24-2009  ? 3 polyps, +TA and HPP  ? hx of vocal cord nodules    ? s/p right finger tendon surgury    ? UPPER GASTROINTESTINAL ENDOSCOPY  01-08-2010  ? normal  ? ? reports that she has never smoked. She has never used smokeless tobacco. She reports that she does not drink alcohol and does not use drugs. ?family history includes Breast cancer (age of onset: 70) in her mother; Diabetes in her mother; Hypertension in her mother and sister. ?Allergies  ?Allergen Reactions  ? Sertraline Hcl Diarrhea  ? ?Current Outpatient Medications on File Prior to Visit  ?Medication Sig Dispense Refill  ? amitriptyline (ELAVIL) 100 MG tablet 1/2 - 1 tab by mouth at bedtime as needed 90 tablet 1  ? amLODipine (NORVASC) 10 MG tablet Take 1 tablet (10 mg total) by mouth daily. 90 tablet 3  ? atorvastatin (LIPITOR) 10 MG tablet Take 1 tablet by mouth once daily 90 tablet 0  ? fexofenadine (ALLEGRA) 180 MG tablet Take 1 tablet (180 mg total) by mouth daily. 30 tablet 11  ? guaiFENesin (MUCINEX) 600 MG 12 hr tablet Take 2 tablets (1,200 mg total) by mouth 2 (two) times daily as needed. 60 tablet 5  ?  hydrochlorothiazide (HYDRODIURIL) 25 MG tablet Take 1 tablet by mouth once daily 90 tablet 3  ? meloxicam (MOBIC) 15 MG tablet 1 tab by mouth once daily as needed 90 tablet 1  ? Multiple Vitamin (MULTIVITAMIN) tablet Take 1 tablet by mouth daily.    ? pantoprazole (PROTONIX) 40 MG tablet Take 1 tablet by mouth once daily 90 tablet 2  ? potassium chloride (KLOR-CON) 10 MEQ tablet Take 1 tablet by mouth once a day 90 tablet 3  ? tizanidine (ZANAFLEX) 2 MG capsule TAKE 1 CAPSULE BY MOUTH THREE TIMES DAILY 60 capsule 0  ? traMADol (ULTRAM) 50 MG tablet Take 1 tablet (50 mg total) by mouth every 6 (six) hours as needed. 30 tablet 5  ? triamcinolone (NASACORT) 55 MCG/ACT AERO nasal inhaler Place 2 sprays into the nose daily. 1 each 12  ? VITAMIN D, CHOLECALCIFEROL, PO Take  5,000 Units by mouth daily.    ? ?No current facility-administered medications on file prior to visit.  ? ?     ROS:  All others reviewed and negative. ? ?Objective  ? ?     PE:  BP 136/70 (BP Location: Left Arm, Patient Position: Sitting, Cuff Size: Normal)   Pulse 89   Temp 98.2 ?F (36.8 ?C) (Oral)   Ht '5\' 3"'$  (1.6 m)   Wt 120 lb (54.4 kg)   SpO2 99%   BMI 21.26 kg/m?  ? ?              Constitutional: Pt appears in NAD ?              HENT: Head: NCAT.  ?              Right Ear: External ear normal.   ?              Left Ear: External ear normal.  ?              Eyes: . Pupils are equal, round, and reactive to light. Conjunctivae and EOM are normal ?              Nose: without d/c or deformity ?              Neck: Neck supple. Gross normal ROM ?              Cardiovascular: Normal rate and regular rhythm.   ?              Pulmonary/Chest: Effort normal and breath sounds without rales or wheezing.  ?              Abd:  Soft, mild epigastric and ruq tender, ND, + BS, no organomegaly ?              Neurological: Pt is alert. At baseline orientation, motor grossly intact ?              Skin: Skin is warm. No rashes, no other new lesions, LE edema - none ?              Psychiatric: Pt behavior is normal without agitation  ? ?Micro: none ? ?Cardiac tracings I have personally interpreted today:  none ? ?Pertinent Radiological findings (summarize): none  ? ?Lab Results  ?Component Value Date  ? WBC 7.4 10/02/2021  ? HGB 11.3 (L) 10/02/2021  ? HCT 34.4 (L) 10/02/2021  ? PLT 313.0 10/02/2021  ? GLUCOSE 106 (H) 10/02/2021  ? CHOL 173 10/02/2021  ?  TRIG 59.0 10/02/2021  ? HDL 77.60 10/02/2021  ? LDLDIRECT 83.3 11/17/2012  ? [place] 83 10/02/2021  ? ALT 22 10/02/2021  ? AST 23 10/02/2021  ? NA 137 10/02/2021  ? K 4.2 10/02/2021  ? CL 103 10/02/2021  ? CREATININE 0.82 10/02/2021  ? BUN 25 (H) 10/02/2021  ? CO2 28 10/02/2021  ? TSH 0.66 10/02/2021  ? HGBA1C 6.7 (H) 10/02/2021  ? ?Assessment/Plan:  ?Briana Schroeder is a 76  y.o. Black or African American [2] female with  has a past medical history of ALLERGIC RHINITIS (08/02/2009), Allergy, ANXIETY (10/18/2008), ARM PAIN, RIGHT (04/04/2009), Arthritis, BACK PAIN (08/02/2009), CHEST PAIN (12/05/2009), COLONIC POLYPS, HX OF (10/18/2008), DIZZINESS (12/05/2009), EPIGASTRIC PAIN (01/09/2010), FATIGUE (09/06/2009), GERD (12/06/2009), HOARSENESS (09/06/2009), HYPERLIPIDEMIA (10/18/2008), HYPERTENSION (10/18/2008), IBS (10/18/2008), MYALGIA (03/21/2009), Osteopenia (12/2018), PULMONARY SARCOIDOSIS (10/18/2008), SINUSITIS- ACUTE-NOS (03/21/2009), and VOCAL CORD NODULE (09/06/2009). ? ?Allergic rhinitis ?Mild to mod, for otc nasacort asd, to f/u any worsening symptoms or concerns ? ?Weight loss ?Etiology unclear, for CT as above, ? Geriatric decline, recent cxr neg, consdier refer GI ? ?RUQ pain ?With mild discomfort on exam - for CT abd/pelvis ? ?Essential hypertension ?BP Readings from Last 3 Encounters:  ?10/02/21 136/70  ?09/20/21 120/72  ?08/09/21 122/68  ? ?Stable, pt to continue medical treatment norvasc, hct ? ? ?Hyperglycemia ?Lab Results  ?Component Value Date  ? HGBA1C 6.7 (H) 10/02/2021  ? ?Stable, pt to continue current medical treatment  - diet ? ? ?Hyperlipidemia ?Lab Results  ?Component Value Date  ? [place] 83 10/02/2021  ? ?Stable, pt to continue current statin liptior ? ?Followup: Return in about 6 months (around 04/03/2022). ? ?Cathlean Cower, MD 10/02/2021 10:46 PM ?[facility] ?[place] ?Internal Medicine ?

## 2021-10-02 NOTE — Assessment & Plan Note (Signed)
Lab Results  ?Component Value Date  ? [place] 83 10/02/2021  ? ?Stable, pt to continue current statin liptior ? ?

## 2021-10-02 NOTE — Assessment & Plan Note (Addendum)
Etiology unclear, for CT as above, ? Geriatric decline, recent cxr neg, consdier refer GI ?

## 2021-10-03 ENCOUNTER — Other Ambulatory Visit (INDEPENDENT_AMBULATORY_CARE_PROVIDER_SITE_OTHER): Payer: Medicare Other

## 2021-10-03 ENCOUNTER — Telehealth: Payer: Self-pay

## 2021-10-03 DIAGNOSIS — D649 Anemia, unspecified: Secondary | ICD-10-CM

## 2021-10-03 LAB — CBC WITH DIFFERENTIAL/PLATELET
Basophils Absolute: 0.1 10*3/uL (ref 0.0–0.1)
Basophils Relative: 0.7 % (ref 0.0–3.0)
Eosinophils Absolute: 0.1 10*3/uL (ref 0.0–0.7)
Eosinophils Relative: 1.5 % (ref 0.0–5.0)
HCT: 32.5 % — ABNORMAL LOW (ref 36.0–46.0)
Hemoglobin: 10.8 g/dL — ABNORMAL LOW (ref 12.0–15.0)
Lymphocytes Relative: 11.1 % — ABNORMAL LOW (ref 12.0–46.0)
Lymphs Abs: 0.8 10*3/uL (ref 0.7–4.0)
MCHC: 33.2 g/dL (ref 30.0–36.0)
MCV: 87.8 fl (ref 78.0–100.0)
Monocytes Absolute: 0.8 10*3/uL (ref 0.1–1.0)
Monocytes Relative: 11 % (ref 3.0–12.0)
Neutro Abs: 5.4 10*3/uL (ref 1.4–7.7)
Neutrophils Relative %: 75.7 % (ref 43.0–77.0)
Platelets: 296 10*3/uL (ref 150.0–400.0)
RBC: 3.7 Mil/uL — ABNORMAL LOW (ref 3.87–5.11)
RDW: 16 % — ABNORMAL HIGH (ref 11.5–15.5)
WBC: 7.2 10*3/uL (ref 4.0–10.5)

## 2021-10-03 LAB — IBC PANEL
Iron: 46 ug/dL (ref 42–145)
Saturation Ratios: 17 % — ABNORMAL LOW (ref 20.0–50.0)
TIBC: 270.2 ug/dL (ref 250.0–450.0)
Transferrin: 193 mg/dL — ABNORMAL LOW (ref 212.0–360.0)

## 2021-10-03 LAB — RETICULOCYTES
ABS Retic: 80300 cells/uL — ABNORMAL HIGH (ref 20000–80000)
Retic Ct Pct: 2.2 %

## 2021-10-03 LAB — FERRITIN: Ferritin: 319.9 ng/mL — ABNORMAL HIGH (ref 10.0–291.0)

## 2021-10-03 NOTE — Addendum Note (Signed)
Addended by: Jacobo Forest on: 10/03/2021 10:00 AM ? ? Modules accepted: Orders ? ?

## 2021-10-03 NOTE — Telephone Encounter (Signed)
Left message for patient to call back and discuss results.

## 2021-10-03 NOTE — Telephone Encounter (Signed)
-----   Message from Biagio Borg, MD sent at 10/02/2021  8:34 PM EDT ----- ?The test results show that your current treatment is OK, as the tests are stable , except there is a new mild anemia, for which the reason is not clear.  We should ask you to also test for iron deficiency, so please return to the lab at your convenience to have this looked into.  You should also hear from the office about this. ? ?Staff to please inform pt, I will do order, and and ask pt to return to lab ?

## 2021-10-05 LAB — PROTEIN ELECTROPHORESIS, SERUM
Albumin ELP: 3.8 g/dL (ref 3.8–4.8)
Alpha 1: 0.3 g/dL (ref 0.2–0.3)
Alpha 2: 1 g/dL — ABNORMAL HIGH (ref 0.5–0.9)
Beta 2: 0.4 g/dL (ref 0.2–0.5)
Beta Globulin: 0.4 g/dL (ref 0.4–0.6)
Gamma Globulin: 1.2 g/dL (ref 0.8–1.7)
Total Protein: 7 g/dL (ref 6.1–8.1)

## 2021-10-09 DIAGNOSIS — Z1152 Encounter for screening for COVID-19: Secondary | ICD-10-CM | POA: Diagnosis not present

## 2021-10-11 ENCOUNTER — Other Ambulatory Visit: Payer: Self-pay | Admitting: Internal Medicine

## 2021-10-11 ENCOUNTER — Other Ambulatory Visit (INDEPENDENT_AMBULATORY_CARE_PROVIDER_SITE_OTHER): Payer: Medicare Other

## 2021-10-11 DIAGNOSIS — R739 Hyperglycemia, unspecified: Secondary | ICD-10-CM

## 2021-10-11 DIAGNOSIS — E559 Vitamin D deficiency, unspecified: Secondary | ICD-10-CM

## 2021-10-11 DIAGNOSIS — E538 Deficiency of other specified B group vitamins: Secondary | ICD-10-CM

## 2021-10-11 DIAGNOSIS — I10 Essential (primary) hypertension: Secondary | ICD-10-CM

## 2021-10-11 DIAGNOSIS — R634 Abnormal weight loss: Secondary | ICD-10-CM

## 2021-10-11 DIAGNOSIS — R1011 Right upper quadrant pain: Secondary | ICD-10-CM

## 2021-10-11 DIAGNOSIS — R1013 Epigastric pain: Secondary | ICD-10-CM

## 2021-10-11 LAB — CBC WITH DIFFERENTIAL/PLATELET
Basophils Absolute: 0 10*3/uL (ref 0.0–0.1)
Basophils Relative: 0.6 % (ref 0.0–3.0)
Eosinophils Absolute: 0.1 10*3/uL (ref 0.0–0.7)
Eosinophils Relative: 1.6 % (ref 0.0–5.0)
HCT: 34 % — ABNORMAL LOW (ref 36.0–46.0)
Hemoglobin: 11.3 g/dL — ABNORMAL LOW (ref 12.0–15.0)
Lymphocytes Relative: 17.1 % (ref 12.0–46.0)
Lymphs Abs: 1.1 10*3/uL (ref 0.7–4.0)
MCHC: 33.1 g/dL (ref 30.0–36.0)
MCV: 88.1 fl (ref 78.0–100.0)
Monocytes Absolute: 0.5 10*3/uL (ref 0.1–1.0)
Monocytes Relative: 7.1 % (ref 3.0–12.0)
Neutro Abs: 4.8 10*3/uL (ref 1.4–7.7)
Neutrophils Relative %: 73.6 % (ref 43.0–77.0)
Platelets: 250 10*3/uL (ref 150.0–400.0)
RBC: 3.86 Mil/uL — ABNORMAL LOW (ref 3.87–5.11)
RDW: 16.4 % — ABNORMAL HIGH (ref 11.5–15.5)
WBC: 6.5 10*3/uL (ref 4.0–10.5)

## 2021-10-11 LAB — URINALYSIS, ROUTINE W REFLEX MICROSCOPIC
Bilirubin Urine: NEGATIVE
Hgb urine dipstick: NEGATIVE
Nitrite: POSITIVE — AB
Specific Gravity, Urine: 1.02 (ref 1.000–1.030)
Total Protein, Urine: NEGATIVE
Urine Glucose: NEGATIVE
Urobilinogen, UA: 1 (ref 0.0–1.0)
pH: 6 (ref 5.0–8.0)

## 2021-10-11 LAB — BASIC METABOLIC PANEL
BUN: 26 mg/dL — ABNORMAL HIGH (ref 6–23)
CO2: 27 mEq/L (ref 19–32)
Calcium: 9.4 mg/dL (ref 8.4–10.5)
Chloride: 101 mEq/L (ref 96–112)
Creatinine, Ser: 0.83 mg/dL (ref 0.40–1.20)
GFR: 68.18 mL/min (ref >60.00)
Glucose, Bld: 99 mg/dL (ref 70–99)
Potassium: 4 mEq/L (ref 3.5–5.1)
Sodium: 137 mEq/L (ref 135–145)

## 2021-10-11 LAB — HEPATIC FUNCTION PANEL
ALT: 22 U/L (ref 0–35)
AST: 29 U/L (ref 0–37)
Albumin: 3.8 g/dL (ref 3.5–5.2)
Alkaline Phosphatase: 23 U/L — ABNORMAL LOW (ref 39–117)
Bilirubin, Direct: 0.1 mg/dL (ref 0.0–0.3)
Total Bilirubin: 0.5 mg/dL (ref 0.2–1.2)
Total Protein: 7 g/dL (ref 6.0–8.3)

## 2021-10-11 LAB — SEDIMENTATION RATE: Sed Rate: 79 mm/hr — ABNORMAL HIGH (ref 0–30)

## 2021-10-11 LAB — LIPID PANEL
Cholesterol: 176 mg/dL (ref 0–200)
HDL: 80.3 mg/dL (ref >39.00)
LDL Cholesterol: 79 mg/dL (ref 0–99)
NonHDL: 95.97
Total CHOL/HDL Ratio: 2
Triglycerides: 84 mg/dL (ref 0.0–149.0)
VLDL: 16.8 mg/dL (ref 0.0–40.0)

## 2021-10-11 LAB — TSH: TSH: 0.84 u[IU]/mL (ref 0.35–5.50)

## 2021-10-11 LAB — LIPASE: Lipase: 51 U/L (ref 11.0–59.0)

## 2021-10-11 LAB — VITAMIN B12: Vitamin B-12: 1037 pg/mL — ABNORMAL HIGH (ref 211–911)

## 2021-10-11 LAB — HEMOGLOBIN A1C: Hgb A1c MFr Bld: 6.6 % — ABNORMAL HIGH (ref 4.6–6.5)

## 2021-10-11 LAB — VITAMIN D 25 HYDROXY (VIT D DEFICIENCY, FRACTURES): VITD: 65.28 ng/mL (ref 30.00–100.00)

## 2021-10-19 ENCOUNTER — Telehealth: Payer: Self-pay | Admitting: Internal Medicine

## 2021-10-19 NOTE — Telephone Encounter (Signed)
Left message for patient to call back to schedule Medicare Annual Wellness Visit   Last AWV  11/17/19  Please schedule at anytime with [facility]-Nurse Health Advisor if patient calls the office back.      Any questions, please call me at [phone] 

## 2021-11-09 DIAGNOSIS — Z1152 Encounter for screening for COVID-19: Secondary | ICD-10-CM | POA: Diagnosis not present

## 2021-11-10 ENCOUNTER — Ambulatory Visit
Admission: RE | Admit: 2021-11-10 | Discharge: 2021-11-10 | Disposition: A | Discharge status: Home / Self Care | Payer: Medicare Other | Source: Ambulatory Visit | Attending: Internal Medicine | Admitting: Internal Medicine

## 2021-11-10 DIAGNOSIS — N281 Cyst of kidney, acquired: Secondary | ICD-10-CM | POA: Diagnosis not present

## 2021-11-10 DIAGNOSIS — N2889 Other specified disorders of kidney and ureter: Secondary | ICD-10-CM | POA: Diagnosis not present

## 2021-11-10 DIAGNOSIS — M4316 Spondylolisthesis, lumbar region: Secondary | ICD-10-CM | POA: Diagnosis not present

## 2021-11-10 DIAGNOSIS — R1011 Right upper quadrant pain: Secondary | ICD-10-CM

## 2021-11-10 DIAGNOSIS — R1013 Epigastric pain: Secondary | ICD-10-CM

## 2021-11-10 DIAGNOSIS — K7689 Other specified diseases of liver: Secondary | ICD-10-CM | POA: Diagnosis not present

## 2021-11-10 MED ORDER — IOPAMIDOL (ISOVUE-300) INJECTION 61%
100.0000 mL | Freq: Once | INTRAVENOUS | Status: AC | PRN
  Administered 2021-11-10: 100 mL via INTRAVENOUS

## 2021-11-14 ENCOUNTER — Other Ambulatory Visit: Payer: Self-pay | Admitting: Internal Medicine

## 2021-11-14 DIAGNOSIS — N2889 Other specified disorders of kidney and ureter: Secondary | ICD-10-CM

## 2021-11-16 ENCOUNTER — Other Ambulatory Visit: Payer: Self-pay | Admitting: Internal Medicine

## 2021-11-20 ENCOUNTER — Other Ambulatory Visit: Payer: Self-pay | Admitting: Internal Medicine

## 2021-11-22 ENCOUNTER — Telehealth: Payer: Self-pay

## 2021-11-22 NOTE — Telephone Encounter (Signed)
[facility] called to get the CT Abdomen Pelvis  order change to say with and without contrast.   Please advise

## 2021-11-23 ENCOUNTER — Other Ambulatory Visit: Payer: Self-pay | Admitting: Internal Medicine

## 2021-11-23 DIAGNOSIS — R1011 Right upper quadrant pain: Secondary | ICD-10-CM

## 2021-11-23 DIAGNOSIS — R634 Abnormal weight loss: Secondary | ICD-10-CM

## 2021-11-24 ENCOUNTER — Other Ambulatory Visit: Payer: Medicare Other

## 2021-11-24 ENCOUNTER — Ambulatory Visit
Admission: RE | Admit: 2021-11-24 | Discharge: 2021-11-24 | Disposition: A | Discharge status: Home / Self Care | Payer: Medicare Other | Source: Ambulatory Visit | Attending: Internal Medicine | Admitting: Internal Medicine

## 2021-11-24 DIAGNOSIS — R634 Abnormal weight loss: Secondary | ICD-10-CM

## 2021-11-24 DIAGNOSIS — N2889 Other specified disorders of kidney and ureter: Secondary | ICD-10-CM | POA: Diagnosis not present

## 2021-11-24 DIAGNOSIS — N281 Cyst of kidney, acquired: Secondary | ICD-10-CM | POA: Diagnosis not present

## 2021-11-24 DIAGNOSIS — R1011 Right upper quadrant pain: Secondary | ICD-10-CM

## 2021-11-24 DIAGNOSIS — I700 Atherosclerosis of aorta: Secondary | ICD-10-CM | POA: Diagnosis not present

## 2021-11-24 MED ORDER — IOPAMIDOL (ISOVUE-300) INJECTION 61%
100.0000 mL | Freq: Once | INTRAVENOUS | Status: AC | PRN
  Administered 2021-11-24: 100 mL via INTRAVENOUS

## 2021-11-24 NOTE — Telephone Encounter (Signed)
PER CHART ORDER WAS CHANGE. PT HAS CT DONE TODAY...Briana Schroeder

## 2021-11-29 ENCOUNTER — Telehealth: Payer: Self-pay

## 2021-11-29 NOTE — Telephone Encounter (Signed)
Noted../lmb 

## 2021-11-29 NOTE — Telephone Encounter (Signed)
Pt called to get results of CT scan.  Advised the pt that Dr. Jenny Reichmann result notes states The test results show that your current treatment is OK, as the CT scan does not show any new significant acute problem.  The cyst to the left kidney is benign and does not require further evaluation.   There is no other need for change of treatment or further evaluation based on these results, at this time.  thanks  Pt had no further questions or concerns at this time.  FYI

## 2021-12-07 ENCOUNTER — Encounter: Payer: Self-pay | Admitting: Allergy

## 2021-12-07 ENCOUNTER — Ambulatory Visit (INDEPENDENT_AMBULATORY_CARE_PROVIDER_SITE_OTHER): Payer: Medicare Other | Admitting: Allergy

## 2021-12-07 VITALS — BP 120/70 | HR 97 | Temp 98.2°F | Resp 16 | Wt 119.4 lb

## 2021-12-07 DIAGNOSIS — L299 Pruritus, unspecified: Secondary | ICD-10-CM | POA: Diagnosis not present

## 2021-12-07 DIAGNOSIS — J301 Allergic rhinitis due to pollen: Secondary | ICD-10-CM | POA: Diagnosis not present

## 2021-12-07 DIAGNOSIS — H1045 Other chronic allergic conjunctivitis: Secondary | ICD-10-CM | POA: Diagnosis not present

## 2021-12-07 NOTE — Patient Instructions (Addendum)
-  Continue avoidance measures for cat, dog and grass pollen -Stop Claritin.  Start Allegra 1 tab daily.  -For nasal congestion recommend use of Nasacort.  Use 2 sprays each nostril daily for 1-2 weeks at a time before stopping once nasal congestion improves for maximum benefit.   -For watery eyes can use Pataday or Alaway 1 drop each eye daily as needed.   -These allergy medications are all OTC options -If allergy symptoms are not well controlled with medication management may consider allergen immunotherapy which is a 3 to 5-year process that we trains the allergic part of the immune system to become tolerant to the environmental allergens that you are allergic to.  Thus if you are no longer allergic then you should have less or no symptom development and less or no allergy medication needs. -Keep skin moisturized and dry skin can lead to worsening itch  Follow-up in 6 months or sooner if needed

## 2021-12-07 NOTE — Progress Notes (Signed)
Follow-up Note  RE: Briana Schroeder MRN: [id number] DOB: Jun 21, 1946 Date of Office Visit: 12/07/2021   History of present illness: Briana Schroeder is a 76 y.o. female presenting today for follow-up of allergic rhinitis with conjunctivitis.  She also has history of eczematous dermatitis.  She was last seen in the office on 06/08/2021 by myself.  She states she has itching all over her body, watery eyes and nasal congestion.  Symptoms seem to be worse this season than previous seasons. She states she has been taking Claritin mostly at this time.  Currently not using any nasal sprays or eyedrops.  She states for the itching she has been using a body oil to keep her skin moisturized.  She has allergy sensitivity to cat, dog and grass pollen on previous testing.   Review of systems: Review of Systems  Constitutional: Negative.   HENT:         See HPI  Eyes:        See HPI  Respiratory: Negative.    Cardiovascular: Negative.   Gastrointestinal: Negative.   Musculoskeletal: Negative.   Skin: Negative.  Negative for rash.       See HPI  Allergic/Immunologic: Negative.   Neurological: Negative.      All other systems negative unless noted above in HPI  Past medical/social/surgical/family history have been reviewed and are unchanged unless specifically indicated below.  No changes  Medication List: Current Outpatient Medications  Medication Sig Dispense Refill   amitriptyline (ELAVIL) 100 MG tablet 1/2 - 1 tab by mouth at bedtime as needed 90 tablet 1   amLODipine (NORVASC) 10 MG tablet Take 1 tablet (10 mg total) by mouth daily. 90 tablet 3   atorvastatin (LIPITOR) 10 MG tablet Take 1 tablet by mouth once daily 90 tablet 0   fexofenadine (ALLEGRA) 180 MG tablet Take 1 tablet (180 mg total) by mouth daily. 30 tablet 11   guaiFENesin (MUCINEX) 600 MG 12 hr tablet Take 2 tablets (1,200 mg total) by mouth 2 (two) times daily as needed. 60 tablet 5   hydrochlorothiazide  (HYDRODIURIL) 25 MG tablet Take 1 tablet by mouth once daily 90 tablet 3   meloxicam (MOBIC) 15 MG tablet 1 tab by mouth once daily as needed 90 tablet 1   Multiple Vitamin (MULTIVITAMIN) tablet Take 1 tablet by mouth daily.     pantoprazole (PROTONIX) 40 MG tablet Take 1 tablet by mouth once daily 90 tablet 2   tizanidine (ZANAFLEX) 2 MG capsule TAKE 1 CAPSULE BY MOUTH THREE TIMES DAILY 60 capsule 0   traMADol (ULTRAM) 50 MG tablet TAKE 1 TABLET BY MOUTH EVERY 6 HOURS AS NEEDED 30 tablet 5   triamcinolone (NASACORT) 55 MCG/ACT AERO nasal inhaler Place 2 sprays into the nose daily. 1 each 12   VITAMIN D, CHOLECALCIFEROL, PO Take 5,000 Units by mouth daily.     cetirizine (ZYRTEC) 10 MG tablet Take 10 mg by mouth daily.     potassium chloride (KLOR-CON) 10 MEQ tablet Take 1 tablet by mouth once a day (Patient not taking: Reported on 12/07/2021) 90 tablet 3   No current facility-administered medications for this visit.     Known medication allergies: Allergies  Allergen Reactions   Sertraline Hcl Diarrhea     Physical examination: Blood pressure 120/70, pulse 97, temperature 98.2 F (36.8 C), temperature source Temporal, resp. rate 16, weight 119 lb 6.4 oz (54.2 kg), SpO2 97 %.  General: Alert, interactive, in no acute distress. HEENT:  PERRLA, TMs pearly gray, turbinates moderately edematous without discharge, post-pharynx non erythematous. Neck: Supple without lymphadenopathy. Lungs: Clear to auscultation without wheezing, rhonchi or rales. {no increased work of breathing. CV: Normal S1, S2 without murmurs. Abdomen: Nondistended, nontender. Skin: Warm and dry, without lesions or rashes. Extremities:  No clubbing, cyanosis or edema. Neuro:   Grossly intact.  Diagnositics/Labs: None today  Assessment and plan: Allergic rhinitis with conjunctivitis Pruritus  -Continue avoidance measures for cat, dog and grass pollen -Stop Claritin.  Start Allegra 1 tab daily.  -For nasal  congestion recommend use of Nasacort.  Use 2 sprays each nostril daily for 1-2 weeks at a time before stopping once nasal congestion improves for maximum benefit.   -For watery eyes can use Pataday or Alaway 1 drop each eye daily as needed.   -These allergy medications are all OTC options -If allergy symptoms are not well controlled with medication management may consider allergen immunotherapy which is a 3 to 5-year process that we trains the allergic part of the immune system to become tolerant to the environmental allergens that you are allergic to.  Thus if you are no longer allergic then you should have less or no symptom development and less or no allergy medication needs. -Keep skin moisturized and dry skin can lead to worsening itch  Follow-up in 6 months or sooner if needed   I appreciate the opportunity to take part in Franki's care. Please do not hesitate to contact me with questions.  Sincerely,   Prudy Feeler, MD Allergy/Immunology [facility]

## 2021-12-27 ENCOUNTER — Encounter: Payer: Self-pay | Admitting: Internal Medicine

## 2021-12-27 ENCOUNTER — Ambulatory Visit (INDEPENDENT_AMBULATORY_CARE_PROVIDER_SITE_OTHER): Payer: Medicare Other | Admitting: Internal Medicine

## 2021-12-27 ENCOUNTER — Ambulatory Visit (INDEPENDENT_AMBULATORY_CARE_PROVIDER_SITE_OTHER): Payer: Medicare Other

## 2021-12-27 VITALS — BP 130/70 | HR 77 | Ht 63.0 in | Wt 120.0 lb

## 2021-12-27 DIAGNOSIS — R0781 Pleurodynia: Secondary | ICD-10-CM | POA: Diagnosis not present

## 2021-12-27 DIAGNOSIS — I10 Essential (primary) hypertension: Secondary | ICD-10-CM

## 2021-12-27 DIAGNOSIS — R079 Chest pain, unspecified: Secondary | ICD-10-CM | POA: Diagnosis not present

## 2021-12-27 DIAGNOSIS — R739 Hyperglycemia, unspecified: Secondary | ICD-10-CM

## 2021-12-27 DIAGNOSIS — S46011D Strain of muscle(s) and tendon(s) of the rotator cuff of right shoulder, subsequent encounter: Secondary | ICD-10-CM

## 2021-12-27 NOTE — Progress Notes (Signed)
Patient ID: Briana Schroeder, female   DOB: June 25, 1946, 76 y.o.   MRN: [id number]        Chief Complaint: follow up upper abd pain/chest pain       HPI:  Briana Schroeder is a 76 y.o. female here with c/o lower anterior chest costal margin dull and sharp pain with inspiration only, and some upper abd pain on deep breathing only, mild to mod, intermittent, without radiation, nothing else makes better or worse.  Denies ST, cough fever, chills, and Pt denies other chest pain, increased sob or doe, wheezing, orthopnea, PND, increased LE swelling, palpitations, dizziness or syncope.  Wt stable recently.  CT negative may 28.   Pt denies polydipsia, polyuria, or new focal neuro s/s.    Pt denies night sweats, loss of appetite, or other constitutional symptoms  Does also have right shoulder pain with lying down and known right rotater cuff issue, followed per ortho.   Wt Readings from Last 3 Encounters:  12/27/21 120 lb (54.4 kg)  12/07/21 119 lb 6.4 oz (54.2 kg)  10/02/21 120 lb (54.4 kg)   BP Readings from Last 3 Encounters:  12/27/21 130/70  12/07/21 120/70  10/02/21 136/70         Past Medical History:  Diagnosis Date   ALLERGIC RHINITIS 08/02/2009   Allergy    ANXIETY 10/18/2008   ARM PAIN, RIGHT 04/04/2009   Arthritis    BACK PAIN 08/02/2009   CHEST PAIN 12/05/2009   COLONIC POLYPS, HX OF 10/18/2008   DIZZINESS 12/05/2009   EPIGASTRIC PAIN 01/09/2010   FATIGUE 09/06/2009   GERD 12/06/2009   HOARSENESS 09/06/2009   HYPERLIPIDEMIA 10/18/2008   HYPERTENSION 10/18/2008   IBS 10/18/2008   MYALGIA 03/21/2009   Osteopenia 12/2018   T score -1.7 FRAX 12% / 2.5% table from prior DEXA 2018   PULMONARY SARCOIDOSIS 10/18/2008   SINUSITIS- ACUTE-NOS 03/21/2009   VOCAL CORD NODULE 09/06/2009   Past Surgical History:  Procedure Laterality Date   ABDOMINAL HYSTERECTOMY  08/1992   TAH-DR G FOR FIBROIDS AND MENORRHAGIA   COLONOSCOPY  11-24-2009   3 polyps, +TA and HPP   hx of vocal cord  nodules     s/p right finger tendon surgury     UPPER GASTROINTESTINAL ENDOSCOPY  01-08-2010   normal    reports that she has never smoked. She has never used smokeless tobacco. She reports that she does not drink alcohol and does not use drugs. family history includes Breast cancer (age of onset: 38) in her mother; Diabetes in her mother; Hypertension in her mother and sister. Allergies  Allergen Reactions   Sertraline Hcl Diarrhea   Current Outpatient Medications on File Prior to Visit  Medication Sig Dispense Refill   amitriptyline (ELAVIL) 100 MG tablet 1/2 - 1 tab by mouth at bedtime as needed 90 tablet 1   amLODipine (NORVASC) 10 MG tablet Take 1 tablet (10 mg total) by mouth daily. 90 tablet 3   atorvastatin (LIPITOR) 10 MG tablet Take 1 tablet by mouth once daily 90 tablet 0   cetirizine (ZYRTEC) 10 MG tablet Take 10 mg by mouth daily.     fexofenadine (ALLEGRA) 180 MG tablet Take 1 tablet (180 mg total) by mouth daily. 30 tablet 11   guaiFENesin (MUCINEX) 600 MG 12 hr tablet Take 2 tablets (1,200 mg total) by mouth 2 (two) times daily as needed. 60 tablet 5   hydrochlorothiazide (HYDRODIURIL) 25 MG tablet Take 1 tablet by mouth once daily  90 tablet 3   meloxicam (MOBIC) 15 MG tablet 1 tab by mouth once daily as needed 90 tablet 1   Multiple Vitamin (MULTIVITAMIN) tablet Take 1 tablet by mouth daily.     pantoprazole (PROTONIX) 40 MG tablet Take 1 tablet by mouth once daily 90 tablet 2   potassium chloride (KLOR-CON) 10 MEQ tablet Take 1 tablet by mouth once a day 90 tablet 3   tizanidine (ZANAFLEX) 2 MG capsule TAKE 1 CAPSULE BY MOUTH THREE TIMES DAILY 60 capsule 0   traMADol (ULTRAM) 50 MG tablet TAKE 1 TABLET BY MOUTH EVERY 6 HOURS AS NEEDED 30 tablet 5   triamcinolone (NASACORT) 55 MCG/ACT AERO nasal inhaler Place 2 sprays into the nose daily. 1 each 12   VITAMIN D, CHOLECALCIFEROL, PO Take 5,000 Units by mouth daily.     No current facility-administered medications on file  prior to visit.        ROS:  All others reviewed and negative.  Objective        PE:  BP 130/70 (BP Location: Right Arm, Patient Position: Sitting, Cuff Size: Normal)   Pulse 77   Ht '5\' 3"'$  (1.6 m)   Wt 120 lb (54.4 kg)   SpO2 99%   BMI 21.26 kg/m                 Constitutional: Pt appears in NAD               HENT: Head: NCAT.                Right Ear: External ear normal.                 Left Ear: External ear normal.                Eyes: . Pupils are equal, round, and reactive to light. Conjunctivae and EOM are normal               Nose: without d/c or deformity               Neck: Neck supple. Gross normal ROM               Cardiovascular: Normal rate and regular rhythm.                 Pulmonary/Chest: Effort normal and breath sounds without rales or wheezing.                Abd:  Soft, NT, ND, + BS, no organomegaly               Neurological: Pt is alert. At baseline orientation, motor grossly intact               Skin: Skin is warm. No rashes, no other new lesions, LE edema - none               Psychiatric: Pt behavior is normal without agitation   Micro: none  Cardiac tracings I have personally interpreted today:  NSR with sinus arrythmia, 67  Pertinent Radiological findings (summarize): none   Lab Results  Component Value Date   WBC 6.5 10/11/2021   HGB 11.3 (L) 10/11/2021   HCT 34.0 (L) 10/11/2021   PLT 250.0 10/11/2021   GLUCOSE 99 10/11/2021   CHOL 176 10/11/2021   TRIG 84.0 10/11/2021   HDL 80.30 10/11/2021   LDLDIRECT 83.3 11/17/2012   LDLCALC 79 10/11/2021   ALT 22 10/11/2021  AST 29 10/11/2021   NA 137 10/11/2021   K 4.0 10/11/2021   CL 101 10/11/2021   CREATININE 0.83 10/11/2021   BUN 26 (H) 10/11/2021   CO2 27 10/11/2021   TSH 0.84 10/11/2021   HGBA1C 6.6 (H) 10/11/2021   Assessment/Plan:  Briana Schroeder is a 76 y.o. Black or African American [2] female with  has a past medical history of ALLERGIC RHINITIS (08/02/2009), Allergy, ANXIETY  (10/18/2008), ARM PAIN, RIGHT (04/04/2009), Arthritis, BACK PAIN (08/02/2009), CHEST PAIN (12/05/2009), COLONIC POLYPS, HX OF (10/18/2008), DIZZINESS (12/05/2009), EPIGASTRIC PAIN (01/09/2010), FATIGUE (09/06/2009), GERD (12/06/2009), HOARSENESS (09/06/2009), HYPERLIPIDEMIA (10/18/2008), HYPERTENSION (10/18/2008), IBS (10/18/2008), MYALGIA (03/21/2009), Osteopenia (12/2018), PULMONARY SARCOIDOSIS (10/18/2008), SINUSITIS- ACUTE-NOS (03/21/2009), and VOCAL CORD NODULE (09/06/2009).  Chest pain, pleuritic Mild intermittent, ecg reviewed, very lo suspicion for cardiac or PE, for CXR but low suspicion for pna as well; most likley c/w costochondritis, for mobic prn  Traumatic incomplete tear of right rotator cuff D/w pt not likely related to chest pain, ok for f/u ortho as planned  Hyperglycemia Lab Results  Component Value Date   HGBA1C 6.6 (H) 10/11/2021   Mild uncontrolled, pt to continue current medical treatment - diet, wt control, activity  Essential hypertension BP Readings from Last 3 Encounters:  12/27/21 130/70  12/07/21 120/70  10/02/21 136/70   Stable, pt to continue medical treatment norvasc 10 qd, hct 25 qd  Followup: Return if symptoms worsen or fail to improve.  Cathlean Cower, MD 12/30/2021 6:27 PM [facility]

## 2021-12-27 NOTE — Patient Instructions (Signed)
Your EKG was done today  Please continue all other medications as before, including the meloxicam for pain  Please have the pharmacy call with any other refills you may need.  Please continue your efforts at being more active, low cholesterol diet, and weight control.  Please keep your appointments with your specialists as you may have planned  Please go to the XRAY Department in the first floor for the x-ray testing  You will be contacted by phone if any changes need to be made immediately.  Otherwise, you will receive a letter about your results with an explanation, but please check with MyChart first.  Please remember to sign up for MyChart if you have not done so, as this will be important to you in the future with finding out test results, communicating by private email, and scheduling acute appointments online when needed.

## 2021-12-30 ENCOUNTER — Encounter: Payer: Self-pay | Admitting: Internal Medicine

## 2021-12-30 NOTE — Assessment & Plan Note (Signed)
D/w pt not likely related to chest pain, ok for f/u ortho as planned

## 2021-12-30 NOTE — Assessment & Plan Note (Signed)
Lab Results  Component Value Date   HGBA1C 6.6 (H) 10/11/2021   Mild uncontrolled, pt to continue current medical treatment - diet, wt control, activity

## 2021-12-30 NOTE — Assessment & Plan Note (Signed)
BP Readings from Last 3 Encounters:  12/27/21 130/70  12/07/21 120/70  10/02/21 136/70   Stable, pt to continue medical treatment norvasc 10 qd, hct 25 qd

## 2021-12-30 NOTE — Assessment & Plan Note (Signed)
Mild intermittent, ecg reviewed, very lo suspicion for cardiac or PE, for CXR but low suspicion for pna as well; most likley c/w costochondritis, for mobic prn

## 2022-01-11 ENCOUNTER — Other Ambulatory Visit: Payer: Self-pay | Admitting: Internal Medicine

## 2022-01-16 NOTE — Progress Notes (Unsigned)
I, Wendy Poet, LAT, ATC, am serving as scribe for Dr. [name].  Briana Schroeder is a 76 y.o. female who presents to [place] at [facility] today for f/u of chronic B shoulder pain likely due to rotator cuff tendinitis.  She was last seen by Dr. Georgina Snell on 09/20/21 and had B subacromial steroid injections.  Prior to that, she had a R subacromial steroid injection on 07/26/20.  Today, pt reports bilat shoulder pain has returned over the last month. Pt c/o increased pain at night.   Diagnostic testing: L shoulder XR- 08/09/21; R shoulder MRI- 07/23/20; R shoulder XR- 04/07/20; R shoulder XR- 04/07/20  Pertinent review of systems: No fevers or chills  Relevant historical information: Hypertension   Exam:  BP 124/72   Pulse 95   Ht '5\' 3"'$  (1.6 m)   Wt 119 lb 3.2 oz (54.1 kg)   SpO2 96%   BMI 21.12 kg/m  General: Well Developed, well nourished, and in no acute distress.   MSK: Right shoulder: Normal appearing Normal motion pain with abduction.  Intact strength Positive Hawkins and Neer's test. Negative Yergason's and speeds test.  Left shoulder: Normal-appearing Normal motion pain with abduction.  Intact strength. Positive Hawkins and Neer's test. Negative Yergason's and speeds test.   Lab and Radiology Results  Procedure: Real-time Ultrasound Guided Injection of right shoulder subacromial bursa Device: Philips Affiniti 50G Images permanently stored and available for review in PACS Verbal informed consent obtained.  Discussed risks and benefits of procedure. Warned about infection, bleeding, hyperglycemia damage to structures among others. Patient expresses understanding and agreement Time-out conducted.   Noted no overlying erythema, induration, or other signs of local infection.   Skin prepped in a sterile fashion.   Local anesthesia: Topical Ethyl chloride.   With sterile technique and under real time ultrasound guidance: 40 mg of Kenalog and 2 mL of  Marcaine injected into subacromial bursa. Fluid seen entering the bursa.   Completed without difficulty   Pain immediately resolved suggesting accurate placement of the medication.   Advised to call if fevers/chills, erythema, induration, drainage, or persistent bleeding.   Images permanently stored and available for review in the ultrasound unit.  Impression: Technically successful ultrasound guided injection.    Procedure: Real-time Ultrasound Guided Injection of left shoulder subacromial bursa Device: Philips Affiniti 50G Images permanently stored and available for review in PACS Verbal informed consent obtained.  Discussed risks and benefits of procedure. Warned about infection, bleeding, hyperglycemia damage to structures among others. Patient expresses understanding and agreement Time-out conducted.   Noted no overlying erythema, induration, or other signs of local infection.   Skin prepped in a sterile fashion.   Local anesthesia: Topical Ethyl chloride.   With sterile technique and under real time ultrasound guidance: 40 mg of Kenalog and 2 mL of Marcaine injected into subacromial bursa. Fluid seen entering the bursa.   Completed without difficulty   Pain immediately resolved suggesting accurate placement of the medication.   Advised to call if fevers/chills, erythema, induration, drainage, or persistent bleeding.   Images permanently stored and available for review in the ultrasound unit.  Impression: Technically successful ultrasound guided injection.        Assessment and Plan: 76 y.o. female with bilateral shoulder pain due to rotator cuff tendinopathy with partial tearing right shoulder based on old MRI as well as subacromial bursitis.  Plan for injection today.  She last had injection January 2022 in March 2023.  Plan to  continue home exercise program as well.  Recheck back as needed.  If the frequency between injections is rapidly decreasing we may need to consider  surgery or other options.   PDMP not reviewed this encounter. Orders Placed This Encounter  Procedures   [name] LIMITED JOINT SPACE STRUCTURES UP BILAT(NO LINKED CHARGES)    Order Specific Question:   Reason for Exam (SYMPTOM  OR DIAGNOSIS REQUIRED)    Answer:   bilateral shoulder pain    Order Specific Question:   Preferred imaging location?    Answer:   [place]   No orders of the defined types were placed in this encounter.    Discussed warning signs or symptoms. Please see discharge instructions. Patient expresses understanding.   The above documentation has been reviewed and is accurate and complete [name], M.D.

## 2022-01-17 ENCOUNTER — Ambulatory Visit: Payer: Self-pay

## 2022-01-17 ENCOUNTER — Ambulatory Visit (INDEPENDENT_AMBULATORY_CARE_PROVIDER_SITE_OTHER): Payer: Medicare Other | Admitting: Family Medicine

## 2022-01-17 VITALS — BP 124/72 | HR 95 | Ht 63.0 in | Wt 119.2 lb

## 2022-01-17 DIAGNOSIS — M25511 Pain in right shoulder: Secondary | ICD-10-CM

## 2022-01-17 DIAGNOSIS — M25512 Pain in left shoulder: Secondary | ICD-10-CM | POA: Diagnosis not present

## 2022-01-17 DIAGNOSIS — G8929 Other chronic pain: Secondary | ICD-10-CM | POA: Diagnosis not present

## 2022-01-17 NOTE — Patient Instructions (Signed)
Thank you for coming in today.   You received an injection today. Seek immediate medical attention if the joint becomes red, extremely painful, or is oozing fluid.   Check back as needed 

## 2022-01-23 ENCOUNTER — Other Ambulatory Visit: Payer: Self-pay | Admitting: Internal Medicine

## 2022-01-26 ENCOUNTER — Ambulatory Visit (INDEPENDENT_AMBULATORY_CARE_PROVIDER_SITE_OTHER): Payer: Medicare Other | Admitting: Internal Medicine

## 2022-01-26 ENCOUNTER — Encounter: Payer: Self-pay | Admitting: Internal Medicine

## 2022-01-26 VITALS — BP 128/64 | HR 82 | Temp 98.0°F | Ht 63.0 in | Wt 113.0 lb

## 2022-01-26 DIAGNOSIS — M79604 Pain in right leg: Secondary | ICD-10-CM | POA: Diagnosis not present

## 2022-01-26 DIAGNOSIS — I10 Essential (primary) hypertension: Secondary | ICD-10-CM

## 2022-01-26 DIAGNOSIS — M79605 Pain in left leg: Secondary | ICD-10-CM

## 2022-01-26 DIAGNOSIS — J301 Allergic rhinitis due to pollen: Secondary | ICD-10-CM | POA: Diagnosis not present

## 2022-01-26 DIAGNOSIS — R634 Abnormal weight loss: Secondary | ICD-10-CM | POA: Diagnosis not present

## 2022-01-26 DIAGNOSIS — R739 Hyperglycemia, unspecified: Secondary | ICD-10-CM | POA: Diagnosis not present

## 2022-01-26 DIAGNOSIS — R748 Abnormal levels of other serum enzymes: Secondary | ICD-10-CM

## 2022-01-26 DIAGNOSIS — M79671 Pain in right foot: Secondary | ICD-10-CM | POA: Diagnosis not present

## 2022-01-26 DIAGNOSIS — R252 Cramp and spasm: Secondary | ICD-10-CM | POA: Diagnosis not present

## 2022-01-26 DIAGNOSIS — M79672 Pain in left foot: Secondary | ICD-10-CM | POA: Diagnosis not present

## 2022-01-26 LAB — CBC WITH DIFFERENTIAL/PLATELET
Basophils Absolute: 0 10*3/uL (ref 0.0–0.1)
Basophils Relative: 0.5 % (ref 0.0–3.0)
Eosinophils Absolute: 0.1 10*3/uL (ref 0.0–0.7)
Eosinophils Relative: 0.6 % (ref 0.0–5.0)
HCT: 33.1 % — ABNORMAL LOW (ref 36.0–46.0)
Hemoglobin: 10.6 g/dL — ABNORMAL LOW (ref 12.0–15.0)
Lymphocytes Relative: 14.1 % (ref 12.0–46.0)
Lymphs Abs: 1.3 10*3/uL (ref 0.7–4.0)
MCHC: 32.2 g/dL (ref 30.0–36.0)
MCV: 85.8 fl (ref 78.0–100.0)
Monocytes Absolute: 0.9 10*3/uL (ref 0.1–1.0)
Monocytes Relative: 9.8 % (ref 3.0–12.0)
Neutro Abs: 6.8 10*3/uL (ref 1.4–7.7)
Neutrophils Relative %: 75 % (ref 43.0–77.0)
Platelets: 498 10*3/uL — ABNORMAL HIGH (ref 150.0–400.0)
RBC: 3.85 Mil/uL — ABNORMAL LOW (ref 3.87–5.11)
RDW: 15.6 % — ABNORMAL HIGH (ref 11.5–15.5)
WBC: 9.1 10*3/uL (ref 4.0–10.5)

## 2022-01-26 LAB — BASIC METABOLIC PANEL
BUN: 36 mg/dL — ABNORMAL HIGH (ref 6–23)
CO2: 30 mEq/L (ref 19–32)
Calcium: 10.4 mg/dL (ref 8.4–10.5)
Chloride: 100 mEq/L (ref 96–112)
Creatinine, Ser: 0.93 mg/dL (ref 0.40–1.20)
GFR: 59.36 mL/min — ABNORMAL LOW (ref >60.00)
Glucose, Bld: 88 mg/dL (ref 70–99)
Potassium: 4.3 mEq/L (ref 3.5–5.1)
Sodium: 137 mEq/L (ref 135–145)

## 2022-01-26 LAB — TSH: TSH: 0.68 u[IU]/mL (ref 0.35–5.50)

## 2022-01-26 LAB — HEPATIC FUNCTION PANEL
ALT: 18 U/L (ref 0–35)
AST: 22 U/L (ref 0–37)
Albumin: 4 g/dL (ref 3.5–5.2)
Alkaline Phosphatase: 28 U/L — ABNORMAL LOW (ref 39–117)
Bilirubin, Direct: 0.1 mg/dL (ref 0.0–0.3)
Total Bilirubin: 0.4 mg/dL (ref 0.2–1.2)
Total Protein: 7.8 g/dL (ref 6.0–8.3)

## 2022-01-26 LAB — CK: Total CK: 203 U/L — ABNORMAL HIGH (ref 7–177)

## 2022-01-26 LAB — MAGNESIUM: Magnesium: 1.8 mg/dL (ref 1.5–2.5)

## 2022-01-26 LAB — SEDIMENTATION RATE: Sed Rate: 61 mm/hr — ABNORMAL HIGH (ref 0–30)

## 2022-01-26 MED ORDER — CYCLOBENZAPRINE HCL 5 MG PO TABS: 5.0000 mg | ORAL_TABLET | Freq: Every evening | ORAL | 3 refills | Status: AC | PRN

## 2022-01-26 NOTE — Progress Notes (Unsigned)
Patient ID: Briana Schroeder, female   DOB: [birth year], 76 y.o.   MRN: [id number]        Chief Complaint: follow up muscle cramping in the feet, hx of elevated CK and sed rate, allergies, wt loss,        HPI:  Briana Schroeder is a 76 y.o. female here with c/o worsening 2 wks feet cramping mostly at night without increased standing, walking, but with pain to the legs as well.  Pt denies chest pain, increased sob or doe, wheezing, orthopnea, PND, increased LE swelling, palpitations, dizziness or syncope.   Pt denies polydipsia, polyuria, or new focal neuro s/s.    Pt denies fever, wt loss, night sweats, loss of appetite, or other constitutional symptoms  Does have several wks ongoing nasal allergy symptoms with clearish congestion, itch and sneezing, without fever, pain, ST, cough, swelling or wheezing.  Has further wt loss for unclear reason, appetite some lower.  Has seen rheum in the past for elevated esr and ck.  Also, Does have several wks ongoing nasal allergy symptoms with clearish congestion, itch and sneezing, without fever, pain, ST, cough, swelling or wheezing. Wt Readings from Last 3 Encounters:  01/26/22 113 lb (51.3 kg)  01/17/22 119 lb 3.2 oz (54.1 kg)  12/27/21 120 lb (54.4 kg)   BP Readings from Last 3 Encounters:  01/26/22 128/64  01/17/22 124/72  12/27/21 130/70         Past Medical History:  Diagnosis Date   ALLERGIC RHINITIS 08/02/2009   Allergy    ANXIETY 10/18/2008   ARM PAIN, RIGHT 04/04/2009   Arthritis    BACK PAIN 08/02/2009   CHEST PAIN 12/05/2009   COLONIC POLYPS, HX OF 10/18/2008   DIZZINESS 12/05/2009   EPIGASTRIC PAIN 01/09/2010   FATIGUE 09/06/2009   GERD 12/06/2009   HOARSENESS 09/06/2009   HYPERLIPIDEMIA 10/18/2008   HYPERTENSION 10/18/2008   IBS 10/18/2008   MYALGIA 03/21/2009   Osteopenia 12/2018   T score -1.7 FRAX 12% / 2.5% table from prior DEXA 2018   PULMONARY SARCOIDOSIS 10/18/2008   SINUSITIS- ACUTE-NOS 03/21/2009   VOCAL CORD NODULE  09/06/2009   Past Surgical History:  Procedure Laterality Date   ABDOMINAL HYSTERECTOMY  08/1992   TAH-DR G FOR FIBROIDS AND MENORRHAGIA   COLONOSCOPY  11-24-2009   3 polyps, +TA and HPP   hx of vocal cord nodules     s/p right finger tendon surgury     UPPER GASTROINTESTINAL ENDOSCOPY  01-08-2010   normal    reports that she has never smoked. She has never used smokeless tobacco. She reports that she does not drink alcohol and does not use drugs. family history includes Breast cancer (age of onset: 19) in her mother; Diabetes in her mother; Hypertension in her mother and sister. Allergies  Allergen Reactions   Sertraline Hcl Diarrhea   Current Outpatient Medications on File Prior to Visit  Medication Sig Dispense Refill   amitriptyline (ELAVIL) 100 MG tablet 1/2 - 1 tab by mouth at bedtime as needed 90 tablet 1   amLODipine (NORVASC) 10 MG tablet Take 1 tablet (10 mg total) by mouth daily. 90 tablet 3   atorvastatin (LIPITOR) 10 MG tablet Take 1 tablet by mouth once daily 90 tablet 0   cetirizine (ZYRTEC) 10 MG tablet Take 1 tablet by mouth once daily 30 tablet 0   fexofenadine (ALLEGRA) 180 MG tablet Take 1 tablet (180 mg total) by mouth daily. 30 tablet 11   guaiFENesin (  MUCINEX) 600 MG 12 hr tablet Take 2 tablets (1,200 mg total) by mouth 2 (two) times daily as needed. 60 tablet 5   meloxicam (MOBIC) 15 MG tablet 1 tab by mouth once daily as needed 90 tablet 1   Multiple Vitamin (MULTIVITAMIN) tablet Take 1 tablet by mouth daily.     pantoprazole (PROTONIX) 40 MG tablet Take 1 tablet by mouth once daily 90 tablet 2   traMADol (ULTRAM) 50 MG tablet TAKE 1 TABLET BY MOUTH EVERY 6 HOURS AS NEEDED 30 tablet 5   triamcinolone (NASACORT) 55 MCG/ACT AERO nasal inhaler Place 2 sprays into the nose daily. 1 each 12   VITAMIN D, CHOLECALCIFEROL, PO Take 5,000 Units by mouth daily.     No current facility-administered medications on file prior to visit.        ROS:  All others reviewed and  negative.  Objective        PE:  BP 128/64 (BP Location: Right Arm, Patient Position: Sitting, Cuff Size: Normal)   Pulse 82   Temp 98 F (36.7 C) (Oral)   Ht _0  (1.6 m)   Wt 113 lb (51.3 kg)   SpO2 99%   BMI 20.02 kg/m                 Constitutional: Pt appears in NAD               HENT: Head: NCAT.                Right Ear: External ear normal.                 Left Ear: External ear normal.                Eyes: . Pupils are equal, round, and reactive to light. Conjunctivae and EOM are normal               Nose: without d/c or deformity               Neck: Neck supple. Gross normal ROM               Cardiovascular: Normal rate and regular rhythm.                 Pulmonary/Chest: Effort normal and breath sounds without rales or wheezing.                Abd:  Soft, NT, ND, + BS, no organomegaly               Neurological: Pt is alert. At baseline orientation, motor grossly intact               Skin: Skin is warm. No rashes, no other new lesions, LE edema - none               Psychiatric: Pt behavior is normal without agitation   Micro: none  Cardiac tracings I have personally interpreted today:  none  Pertinent Radiological findings (summarize): none   Lab Results  Component Value Date   WBC 9.1 01/26/2022   HGB 10.6 (L) 01/26/2022   HCT 33.1 (L) 01/26/2022   PLT 498.0 (H) 01/26/2022   GLUCOSE 88 01/26/2022   CHOL 176 10/11/2021   TRIG 84.0 10/11/2021   HDL 80.30 10/11/2021   LDLDIRECT 83.3 11/17/2012   LDLCALC 79 10/11/2021   ALT 18 01/26/2022   AST 22 01/26/2022   NA 137 01/26/2022   K 4.3  01/26/2022   CL 100 01/26/2022   CREATININE 0.93 01/26/2022   BUN 36 (H) 01/26/2022   CO2 30 01/26/2022   TSH 0.68 01/26/2022   HGBA1C 6.6 (H) 10/11/2021   Assessment/Plan:  Briana Schroeder is a 76 y.o. Black or African American [2] female with  has a past medical history of ALLERGIC RHINITIS (08/02/2009), Allergy, ANXIETY (10/18/2008), ARM PAIN, RIGHT (04/04/2009),  Arthritis, BACK PAIN (08/02/2009), CHEST PAIN (12/05/2009), COLONIC POLYPS, HX OF (10/18/2008), DIZZINESS (12/05/2009), EPIGASTRIC PAIN (01/09/2010), FATIGUE (09/06/2009), GERD (12/06/2009), HOARSENESS (09/06/2009), HYPERLIPIDEMIA (10/18/2008), HYPERTENSION (10/18/2008), IBS (10/18/2008), MYALGIA (03/21/2009), Osteopenia (12/2018), PULMONARY SARCOIDOSIS (10/18/2008), SINUSITIS- ACUTE-NOS (03/21/2009), and VOCAL CORD NODULE (09/06/2009).  Weight loss Etiology unclear, for ensure bid  Muscle cramps Etiology unclear, for ABI exam, lytes and mag, cbc, esr, ck, flexeril 5 qhs , and d/c hct and K for now  Hyperglycemia Lab Results  Component Value Date   HGBA1C 6.6 (H) 10/11/2021   Stable, pt to continue current medical treatment  - diet, excercise   Allergic rhinitis Mild to mod, for nasacort asd,,  to f/u any worsening symptoms or concerns  Followup: Return in about 3 months (around 04/28/2022).  Cathlean Cower, MD 01/27/2022 9:17 PM [facility]

## 2022-01-26 NOTE — Patient Instructions (Addendum)
Please restart the Nasacort for the allergies  Please STOP the HCT fluid pill (hydrochlorothiazide) 25 mg per day, AND the potassium pill as well  Please take all new medication as prescribed - the generic for flexeril 5 mg at bedtime as needed  Please continue all other medications as before, and refills have been done if requested.  Please have the pharmacy call with any other refills you may need.  Please continue your efforts at being more active, low cholesterol diet, and the ENSURE twice per day  You are otherwise up to date with prevention measures today.  Please keep your appointments with your specialists as you may have planned - Dr Tamala Julian for the right elbow  You will be contacted regarding the referral for: leg circulation testing  Please go to the LAB at the blood drawing area for the tests to be done  You will be contacted by phone if any changes need to be made immediately.  Otherwise, you will receive a letter about your results with an explanation, but please check with MyChart first.  Please remember to sign up for MyChart if you have not done so, as this will be important to you in the future with finding out test results, communicating by private email, and scheduling acute appointments online when needed.  Please make an Appointment to return in 3 months, or sooner if needed

## 2022-01-27 ENCOUNTER — Encounter: Payer: Self-pay | Admitting: Internal Medicine

## 2022-01-27 ENCOUNTER — Other Ambulatory Visit: Payer: Self-pay | Admitting: Internal Medicine

## 2022-01-27 DIAGNOSIS — G7249 Other inflammatory and immune myopathies, not elsewhere classified: Secondary | ICD-10-CM

## 2022-01-27 NOTE — Assessment & Plan Note (Addendum)
Etiology unclear, for ABI exam, lytes and mag, cbc, esr, ck, flexeril 5 qhs , and d/c hct and K for now

## 2022-01-27 NOTE — Assessment & Plan Note (Signed)
Etiology unclear, for ensure bid

## 2022-01-27 NOTE — Assessment & Plan Note (Signed)
Lab Results  Component Value Date   HGBA1C 6.6 (H) 10/11/2021   Stable, pt to continue current medical treatment  - diet, excercise

## 2022-01-27 NOTE — Assessment & Plan Note (Signed)
Mild to mod, for nasacort asd,,  to f/u any worsening symptoms or concerns

## 2022-01-30 ENCOUNTER — Other Ambulatory Visit: Payer: Self-pay | Admitting: Internal Medicine

## 2022-01-30 DIAGNOSIS — M79604 Pain in right leg: Secondary | ICD-10-CM

## 2022-01-30 DIAGNOSIS — I70223 Atherosclerosis of native arteries of extremities with rest pain, bilateral legs: Secondary | ICD-10-CM

## 2022-02-02 ENCOUNTER — Encounter: Payer: Self-pay | Admitting: Internal Medicine

## 2022-02-02 ENCOUNTER — Ambulatory Visit (HOSPITAL_COMMUNITY)
Admission: RE | Admit: 2022-02-02 | Discharge: 2022-02-02 | Disposition: A | Discharge status: Home / Self Care | Payer: Medicare Other | Source: Ambulatory Visit | Attending: Cardiology | Admitting: Cardiology

## 2022-02-02 DIAGNOSIS — M79605 Pain in left leg: Secondary | ICD-10-CM | POA: Diagnosis not present

## 2022-02-02 DIAGNOSIS — M79604 Pain in right leg: Secondary | ICD-10-CM | POA: Diagnosis not present

## 2022-02-02 DIAGNOSIS — M79672 Pain in left foot: Secondary | ICD-10-CM | POA: Diagnosis not present

## 2022-02-02 DIAGNOSIS — M79671 Pain in right foot: Secondary | ICD-10-CM

## 2022-02-02 DIAGNOSIS — I70223 Atherosclerosis of native arteries of extremities with rest pain, bilateral legs: Secondary | ICD-10-CM

## 2022-02-08 ENCOUNTER — Other Ambulatory Visit: Payer: Self-pay | Admitting: Internal Medicine

## 2022-02-08 DIAGNOSIS — Z1231 Encounter for screening mammogram for malignant neoplasm of breast: Secondary | ICD-10-CM

## 2022-02-13 ENCOUNTER — Other Ambulatory Visit: Payer: Self-pay | Admitting: Internal Medicine

## 2022-02-23 ENCOUNTER — Other Ambulatory Visit: Payer: Self-pay | Admitting: Internal Medicine

## 2022-03-15 ENCOUNTER — Ambulatory Visit: Payer: Medicare Other

## 2022-03-15 ENCOUNTER — Other Ambulatory Visit: Payer: Self-pay | Admitting: Internal Medicine

## 2022-03-29 ENCOUNTER — Ambulatory Visit (INDEPENDENT_AMBULATORY_CARE_PROVIDER_SITE_OTHER): Payer: Medicare Other | Admitting: Internal Medicine

## 2022-03-29 VITALS — BP 130/70 | HR 89 | Temp 98.7°F | Ht 63.0 in | Wt 115.0 lb

## 2022-03-29 DIAGNOSIS — R739 Hyperglycemia, unspecified: Secondary | ICD-10-CM

## 2022-03-29 DIAGNOSIS — I10 Essential (primary) hypertension: Secondary | ICD-10-CM | POA: Diagnosis not present

## 2022-03-29 DIAGNOSIS — M1990 Unspecified osteoarthritis, unspecified site: Secondary | ICD-10-CM | POA: Diagnosis not present

## 2022-03-29 NOTE — Progress Notes (Signed)
Patient ID: Briana Schroeder, female   DOB: [birth year], 76 y.o.   MRN: [id number]        Chief Complaint: follow up right upper chest swelling       HPI:  Briana Schroeder is a 76 y.o. female here with c/o 1 day onset swelling to the right sternoclavicular joint, has lost considerable wt recently but I convinced this has only been present since yesterday,  No pain.  Pt denies chest pain, increased sob or doe, wheezing, orthopnea, PND, increased LE swelling, palpitations, dizziness or syncope.   Pt denies polydipsia, polyuria, or new focal neuro s/s.  Pt denies fever, night sweats, loss of appetite, or other constitutional symptoms        Wt Readings from Last 3 Encounters:  03/29/22 115 lb (52.2 kg)  01/26/22 113 lb (51.3 kg)  01/17/22 119 lb 3.2 oz (54.1 kg)   BP Readings from Last 3 Encounters:  03/29/22 130/70  01/26/22 128/64  01/17/22 124/72         Past Medical History:  Diagnosis Date   ALLERGIC RHINITIS 08/02/2009   Allergy    ANXIETY 10/18/2008   ARM PAIN, RIGHT 04/04/2009   Arthritis    BACK PAIN 08/02/2009   CHEST PAIN 12/05/2009   COLONIC POLYPS, HX OF 10/18/2008   DIZZINESS 12/05/2009   EPIGASTRIC PAIN 01/09/2010   FATIGUE 09/06/2009   GERD 12/06/2009   HOARSENESS 09/06/2009   HYPERLIPIDEMIA 10/18/2008   HYPERTENSION 10/18/2008   IBS 10/18/2008   MYALGIA 03/21/2009   Osteopenia 12/2018   T score -1.7 FRAX 12% / 2.5% table from prior DEXA 2018   PULMONARY SARCOIDOSIS 10/18/2008   SINUSITIS- ACUTE-NOS 03/21/2009   VOCAL CORD NODULE 09/06/2009   Past Surgical History:  Procedure Laterality Date   ABDOMINAL HYSTERECTOMY  08/1992   TAH-DR G FOR FIBROIDS AND MENORRHAGIA   COLONOSCOPY  11-24-2009   3 polyps, +TA and HPP   hx of vocal cord nodules     s/p right finger tendon surgury     UPPER GASTROINTESTINAL ENDOSCOPY  01-08-2010   normal    reports that she has never smoked. She has never used smokeless tobacco. She reports that she does not drink alcohol  and does not use drugs. family history includes Breast cancer (age of onset: 48) in her mother; Diabetes in her mother; Hypertension in her mother and sister. Allergies  Allergen Reactions   Sertraline Hcl Diarrhea   Current Outpatient Medications on File Prior to Visit  Medication Sig Dispense Refill   amitriptyline (ELAVIL) 100 MG tablet 1/2 - 1 tab by mouth at bedtime as needed 90 tablet 1   amLODipine (NORVASC) 10 MG tablet Take 1 tablet (10 mg total) by mouth daily. 90 tablet 3   atorvastatin (LIPITOR) 10 MG tablet Take 1 tablet by mouth once daily 90 tablet 0   cetirizine (ZYRTEC) 10 MG tablet Take 1 tablet by mouth once daily 30 tablet 0   cyclobenzaprine (FLEXERIL) 5 MG tablet Take 1 tablet (5 mg total) by mouth at bedtime as needed for muscle spasms. 90 tablet 3   fexofenadine (ALLEGRA) 180 MG tablet Take 1 tablet (180 mg total) by mouth daily. 30 tablet 11   guaiFENesin (MUCINEX) 600 MG 12 hr tablet Take 2 tablets (1,200 mg total) by mouth 2 (two) times daily as needed. 60 tablet 5   meloxicam (MOBIC) 15 MG tablet 1 tab by mouth once daily as needed 90 tablet 1   Multiple Vitamin (MULTIVITAMIN) tablet  Take 1 tablet by mouth daily.     pantoprazole (PROTONIX) 40 MG tablet Take 1 tablet by mouth once daily 90 tablet 2   triamcinolone (NASACORT) 55 MCG/ACT AERO nasal inhaler Place 2 sprays into the nose daily. 1 each 12   VITAMIN D, CHOLECALCIFEROL, PO Take 5,000 Units by mouth daily.     No current facility-administered medications on file prior to visit.        ROS:  All others reviewed and negative.  Objective        PE:  BP 130/70 (BP Location: Left Arm, Patient Position: Sitting, Cuff Size: Large)   Pulse 89   Temp 98.7 F (37.1 C) (Oral)   Ht '5\' 3"'$  (1.6 m)   Wt 115 lb (52.2 kg)   SpO2 98%   BMI 20.37 kg/m                 Constitutional: Pt appears in NAD               HENT: Head: NCAT.                Right Ear: External ear normal.                 Left Ear:  External ear normal.                Eyes: . Pupils are equal, round, and reactive to light. Conjunctivae and EOM are normal               Nose: without d/c or deformity               Neck: Neck supple. Gross normal ROM               Cardiovascular: Normal rate and regular rhythm.                 Pulmonary/Chest: Effort normal and breath sounds without rales or wheezing.                Abd:  Soft, NT, ND, + BS, no organomegaly               Neurological: Pt is alert. At baseline orientation, motor grossly intact               Skin: Skin is warm. No rashes, no other new lesions, LE edema - none;  right sternoclavicular joint with large mild tender bony swelling               Psychiatric: Pt behavior is normal without agitation   Micro: none  Cardiac tracings I have personally interpreted today:  none  Pertinent Radiological findings (summarize): none   Lab Results  Component Value Date   WBC 9.1 01/26/2022   HGB 10.6 (L) 01/26/2022   HCT 33.1 (L) 01/26/2022   PLT 498.0 (H) 01/26/2022   GLUCOSE 88 01/26/2022   CHOL 176 10/11/2021   TRIG 84.0 10/11/2021   HDL 80.30 10/11/2021   LDLDIRECT 83.3 11/17/2012   LDLCALC 79 10/11/2021   ALT 18 01/26/2022   AST 22 01/26/2022   NA 137 01/26/2022   K 4.3 01/26/2022   CL 100 01/26/2022   CREATININE 0.93 01/26/2022   BUN 36 (H) 01/26/2022   CO2 30 01/26/2022   TSH 0.68 01/26/2022   HGBA1C 6.6 (H) 10/11/2021   Assessment/Plan:  Briana Schroeder is a 76 y.o. Black or African American [2] female with  has a past medical history  of ALLERGIC RHINITIS (08/02/2009), Allergy, ANXIETY (10/18/2008), ARM PAIN, RIGHT (04/04/2009), Arthritis, BACK PAIN (08/02/2009), CHEST PAIN (12/05/2009), COLONIC POLYPS, HX OF (10/18/2008), DIZZINESS (12/05/2009), EPIGASTRIC PAIN (01/09/2010), FATIGUE (09/06/2009), GERD (12/06/2009), HOARSENESS (09/06/2009), HYPERLIPIDEMIA (10/18/2008), HYPERTENSION (10/18/2008), IBS (10/18/2008), MYALGIA (03/21/2009), Osteopenia  (12/2018), PULMONARY SARCOIDOSIS (10/18/2008), SINUSITIS- ACUTE-NOS (03/21/2009), and VOCAL CORD NODULE (09/06/2009).  Arthritis Mild to mod, right sternoclavicular specific, for otc voltaren gel prn,  to f/u any worsening symptoms or concerns  Hyperglycemia Lab Results  Component Value Date   HGBA1C 6.6 (H) 10/11/2021   Mild uncontrolled, pt to continue current diet, wt control, excercise   Essential hypertension BP Readings from Last 3 Encounters:  03/29/22 130/70  01/26/22 128/64  01/17/22 124/72   Stable, pt to continue medical treatment norvasc 10 mg qd  Followup: Return if symptoms worsen or fail to improve.  Cathlean Cower, MD 03/31/2022 2:42 PM [facility]

## 2022-03-31 ENCOUNTER — Other Ambulatory Visit: Payer: Self-pay | Admitting: Internal Medicine

## 2022-03-31 ENCOUNTER — Encounter: Payer: Self-pay | Admitting: Internal Medicine

## 2022-03-31 DIAGNOSIS — M1990 Unspecified osteoarthritis, unspecified site: Secondary | ICD-10-CM | POA: Insufficient documentation

## 2022-03-31 NOTE — Assessment & Plan Note (Signed)
BP Readings from Last 3 Encounters:  03/29/22 130/70  01/26/22 128/64  01/17/22 124/72   Stable, pt to continue medical treatment norvasc 10 mg qd

## 2022-03-31 NOTE — Assessment & Plan Note (Signed)
Lab Results  Component Value Date   HGBA1C 6.6 (H) 10/11/2021   Mild uncontrolled, pt to continue current diet, wt control, excercise

## 2022-03-31 NOTE — Assessment & Plan Note (Signed)
Mild to mod, right sternoclavicular specific, for otc voltaren gel prn,  to f/u any worsening symptoms or concerns

## 2022-03-31 NOTE — Patient Instructions (Signed)
Ok to take the OTC voltaren gel as needed  Please continue all other medications as before, and refills have been done if requested.  Please have the pharmacy call with any other refills you may need.  Please continue your efforts at being more active, low cholesterol diet, and weight control.  Please keep your appointments with your specialists as you may have planned

## 2022-04-03 ENCOUNTER — Ambulatory Visit: Payer: Medicare Other

## 2022-04-03 ENCOUNTER — Ambulatory Visit: Payer: Medicare Other | Admitting: Internal Medicine

## 2022-04-06 ENCOUNTER — Other Ambulatory Visit: Payer: Self-pay | Admitting: Internal Medicine

## 2022-04-06 NOTE — Telephone Encounter (Signed)
Please refill as per office routine med refill policy (all routine meds to be refilled for 3 mo or monthly (per pt preference) up to one year from last visit, then month to month grace period for 3 mo, then further med refills will have to be denied) ? ?

## 2022-04-11 ENCOUNTER — Other Ambulatory Visit: Payer: Self-pay | Admitting: Internal Medicine

## 2022-04-11 ENCOUNTER — Ambulatory Visit: Payer: Medicare Other

## 2022-04-13 ENCOUNTER — Other Ambulatory Visit: Payer: Self-pay | Admitting: Internal Medicine

## 2022-04-13 NOTE — Telephone Encounter (Signed)
Please refill as per office routine med refill policy (all routine meds to be refilled for 3 mo or monthly (per pt preference) up to one year from last visit, then month to month grace period for 3 mo, then further med refills will have to be denied) ? ?

## 2022-04-18 ENCOUNTER — Other Ambulatory Visit: Payer: Self-pay | Admitting: Internal Medicine

## 2022-04-26 ENCOUNTER — Ambulatory Visit (INDEPENDENT_AMBULATORY_CARE_PROVIDER_SITE_OTHER): Payer: Medicare Other

## 2022-04-26 ENCOUNTER — Ambulatory Visit (INDEPENDENT_AMBULATORY_CARE_PROVIDER_SITE_OTHER): Payer: Medicare Other | Admitting: Internal Medicine

## 2022-04-26 VITALS — BP 124/66 | HR 83 | Temp 98.2°F | Ht 63.0 in | Wt 114.0 lb

## 2022-04-26 DIAGNOSIS — R079 Chest pain, unspecified: Secondary | ICD-10-CM | POA: Diagnosis not present

## 2022-04-26 DIAGNOSIS — E538 Deficiency of other specified B group vitamins: Secondary | ICD-10-CM

## 2022-04-26 DIAGNOSIS — G8929 Other chronic pain: Secondary | ICD-10-CM

## 2022-04-26 DIAGNOSIS — E559 Vitamin D deficiency, unspecified: Secondary | ICD-10-CM | POA: Diagnosis not present

## 2022-04-26 DIAGNOSIS — M25511 Pain in right shoulder: Secondary | ICD-10-CM

## 2022-04-26 DIAGNOSIS — D649 Anemia, unspecified: Secondary | ICD-10-CM

## 2022-04-26 DIAGNOSIS — M25512 Pain in left shoulder: Secondary | ICD-10-CM | POA: Diagnosis not present

## 2022-04-26 DIAGNOSIS — M7989 Other specified soft tissue disorders: Secondary | ICD-10-CM | POA: Diagnosis not present

## 2022-04-26 DIAGNOSIS — M79662 Pain in left lower leg: Secondary | ICD-10-CM

## 2022-04-26 LAB — CBC WITH DIFFERENTIAL/PLATELET
Basophils Absolute: 0.1 10*3/uL (ref 0.0–0.1)
Basophils Relative: 0.8 % (ref 0.0–3.0)
Eosinophils Absolute: 0.2 10*3/uL (ref 0.0–0.7)
Eosinophils Relative: 1.8 % (ref 0.0–5.0)
HCT: 29.3 % — ABNORMAL LOW (ref 36.0–46.0)
Hemoglobin: 9.5 g/dL — ABNORMAL LOW (ref 12.0–15.0)
Lymphocytes Relative: 14.8 % (ref 12.0–46.0)
Lymphs Abs: 1.2 10*3/uL (ref 0.7–4.0)
MCHC: 32.5 g/dL (ref 30.0–36.0)
MCV: 81.6 fl (ref 78.0–100.0)
Monocytes Absolute: 0.7 10*3/uL (ref 0.1–1.0)
Monocytes Relative: 8.6 % (ref 3.0–12.0)
Neutro Abs: 6.2 10*3/uL (ref 1.4–7.7)
Neutrophils Relative %: 74 % (ref 43.0–77.0)
Platelets: 493 10*3/uL — ABNORMAL HIGH (ref 150.0–400.0)
RBC: 3.59 Mil/uL — ABNORMAL LOW (ref 3.87–5.11)
RDW: 17.8 % — ABNORMAL HIGH (ref 11.5–15.5)
WBC: 8.3 10*3/uL (ref 4.0–10.5)

## 2022-04-26 LAB — IBC PANEL
Iron: 26 ug/dL — ABNORMAL LOW (ref 42–145)
Saturation Ratios: 11 % — ABNORMAL LOW (ref 20.0–50.0)
TIBC: 236.6 ug/dL — ABNORMAL LOW (ref 250.0–450.0)
Transferrin: 169 mg/dL — ABNORMAL LOW (ref 212.0–360.0)

## 2022-04-26 LAB — BASIC METABOLIC PANEL
BUN: 29 mg/dL — ABNORMAL HIGH (ref 6–23)
CO2: 27 mEq/L (ref 19–32)
Calcium: 9.9 mg/dL (ref 8.4–10.5)
Chloride: 101 mEq/L (ref 96–112)
Creatinine, Ser: 0.87 mg/dL (ref 0.40–1.20)
GFR: 64.19 mL/min (ref >60.00)
Glucose, Bld: 93 mg/dL (ref 70–99)
Potassium: 4.2 mEq/L (ref 3.5–5.1)
Sodium: 136 mEq/L (ref 135–145)

## 2022-04-26 LAB — CK: Total CK: 780 U/L — ABNORMAL HIGH (ref 7–177)

## 2022-04-26 LAB — FERRITIN: Ferritin: 317 ng/mL — ABNORMAL HIGH (ref 10.0–291.0)

## 2022-04-26 LAB — SEDIMENTATION RATE: Sed Rate: 116 mm/hr — ABNORMAL HIGH (ref 0–30)

## 2022-04-26 LAB — C-REACTIVE PROTEIN: CRP: 7.2 mg/dL (ref 0.5–20.0)

## 2022-04-26 LAB — HEPATIC FUNCTION PANEL
ALT: 20 U/L (ref 0–35)
AST: 31 U/L (ref 0–37)
Albumin: 3.6 g/dL (ref 3.5–5.2)
Alkaline Phosphatase: 43 U/L (ref 39–117)
Bilirubin, Direct: 0.1 mg/dL (ref 0.0–0.3)
Total Bilirubin: 0.3 mg/dL (ref 0.2–1.2)
Total Protein: 7.7 g/dL (ref 6.0–8.3)

## 2022-04-26 LAB — VITAMIN D 25 HYDROXY (VIT D DEFICIENCY, FRACTURES): VITD: 61.69 ng/mL (ref 30.00–100.00)

## 2022-04-26 LAB — VITAMIN B12: Vitamin B-12: 1459 pg/mL — ABNORMAL HIGH (ref 211–911)

## 2022-04-26 NOTE — Patient Instructions (Addendum)
Ok to cancel the Nov 2 appt here with me  Ok to increase the Boost to at least 2 cans per day  Please continue all other medications as before, including the tramadol that you have for pain  Please have the pharmacy call with any other refills you may need.  Please continue your efforts at being more active, low cholesterol diet, and weight control.  You are otherwise up to date with prevention measures today.  Please keep your appointments with your specialists as you may have planned - rheumatology Feb 28  You will be contacted regarding the referral for: Sports medicine also so you can be seen sooner  You will be contacted regarding the referral for: Venous doppler (stat) for the Left leg to check for blood clot  Please go to the XRAY Department in the first floor for the x-ray testing  Please go to the LAB at the blood drawing area for the tests to be done  You will be contacted by phone if any changes need to be made immediately.  Otherwise, you will receive a letter about your results with an explanation, but please check with MyChart first.  Please remember to sign up for MyChart if you have not done so, as this will be important to you in the future with finding out test results, communicating by private email, and scheduling acute appointments online when needed.  Please make an Appointment to return in 4 months, or sooner if needed

## 2022-04-26 NOTE — Progress Notes (Signed)
Patient ID: Briana Schroeder, female   DOB: 08-28-1945, 76 y.o.   MRN: [id number]        Chief Complaint: follow up early than nov 2 with right chest pain, bilateral shoulder pain, recent wt loss, and new LLE pain and swelling below the knee        HPI:  Briana Schroeder is a 76 y.o. female here with fleeting right sided chest pains to the right lateral chest, mild intemrittent without fever, sob.  Has also chronic bilateral shoulder pains worsening recently as well, but seem different from the chest pain. Has hx of elevated CK and sed rate, has rheum appt pending but unable to be seen until feb 28.  No recent overt bleeding.  Also incidentally with 1 wk onset worsening left lower leg pain and swelling for unclear reason.  Pt denies chest pain, increased sob or doe, wheezing, orthopnea, PND, increased LE swelling, palpitations, dizziness or syncope.   Pt denies polydipsia, polyuria, or new focal neuro s/s.          Wt Readings from Last 3 Encounters:  04/26/22 114 lb (51.7 kg)  03/29/22 115 lb (52.2 kg)  01/26/22 113 lb (51.3 kg)   BP Readings from Last 3 Encounters:  04/26/22 124/66  03/29/22 130/70  01/26/22 128/64         Past Medical History:  Diagnosis Date   ALLERGIC RHINITIS 08/02/2009   Allergy    ANXIETY 10/18/2008   ARM PAIN, RIGHT 04/04/2009   Arthritis    BACK PAIN 08/02/2009   CHEST PAIN 12/05/2009   COLONIC POLYPS, HX OF 10/18/2008   DIZZINESS 12/05/2009   EPIGASTRIC PAIN 01/09/2010   FATIGUE 09/06/2009   GERD 12/06/2009   HOARSENESS 09/06/2009   HYPERLIPIDEMIA 10/18/2008   HYPERTENSION 10/18/2008   IBS 10/18/2008   MYALGIA 03/21/2009   Osteopenia 12/2018   T score -1.7 FRAX 12% / 2.5% table from prior DEXA 2018   PULMONARY SARCOIDOSIS 10/18/2008   SINUSITIS- ACUTE-NOS 03/21/2009   VOCAL CORD NODULE 09/06/2009   Past Surgical History:  Procedure Laterality Date   ABDOMINAL HYSTERECTOMY  08/1992   TAH-DR G FOR FIBROIDS AND MENORRHAGIA   COLONOSCOPY   11-24-2009   3 polyps, +TA and HPP   hx of vocal cord nodules     s/p right finger tendon surgury     UPPER GASTROINTESTINAL ENDOSCOPY  01-08-2010   normal    reports that she has never smoked. She has never used smokeless tobacco. She reports that she does not drink alcohol and does not use drugs. family history includes Breast cancer (age of onset: 59) in her mother; Diabetes in her mother; Hypertension in her mother and sister. Allergies  Allergen Reactions   Sertraline Hcl Diarrhea   Current Outpatient Medications on File Prior to Visit  Medication Sig Dispense Refill   amitriptyline (ELAVIL) 100 MG tablet 1/2 - 1 tab by mouth at bedtime as needed 90 tablet 1   amLODipine (NORVASC) 10 MG tablet Take 1 tablet by mouth once daily 90 tablet 0   atorvastatin (LIPITOR) 10 MG tablet Take 1 tablet by mouth once daily 90 tablet 0   cetirizine (ZYRTEC) 10 MG tablet Take 1 tablet by mouth once daily 30 tablet 0   cyclobenzaprine (FLEXERIL) 5 MG tablet Take 1 tablet (5 mg total) by mouth at bedtime as needed for muscle spasms. 90 tablet 3   guaiFENesin (MUCINEX) 600 MG 12 hr tablet Take 2 tablets (1,200 mg total) by mouth  2 (two) times daily as needed. 60 tablet 5   meloxicam (MOBIC) 15 MG tablet TAKE 1 TABLET BY MOUTH ONCE DAILY AS NEEDED 90 tablet 3   Multiple Vitamin (MULTIVITAMIN) tablet Take 1 tablet by mouth daily.     pantoprazole (PROTONIX) 40 MG tablet Take 1 tablet by mouth once daily 90 tablet 0   traMADol (ULTRAM) 50 MG tablet TAKE 1 TABLET BY MOUTH EVERY 6 HOURS AS NEEDED 30 tablet 5   triamcinolone (NASACORT) 55 MCG/ACT AERO nasal inhaler Place 2 sprays into the nose daily. 1 each 12   VITAMIN D, CHOLECALCIFEROL, PO Take 5,000 Units by mouth daily.     fexofenadine (ALLEGRA) 180 MG tablet Take 1 tablet (180 mg total) by mouth daily. 30 tablet 11   No current facility-administered medications on file prior to visit.        ROS:  All others reviewed and negative.  Objective         PE:  BP 124/66 (BP Location: Left Arm, Patient Position: Sitting, Cuff Size: Large)   Pulse 83   Temp 98.2 F (36.8 C) (Oral)   Ht '5\' 3"'$  (1.6 m)   Wt 114 lb (51.7 kg)   SpO2 99%   BMI 20.19 kg/m                 Constitutional: Pt appears in NAD               HENT: Head: NCAT.                Right Ear: External ear normal.                 Left Ear: External ear normal.                Eyes: . Pupils are equal, round, and reactive to light. Conjunctivae and EOM are normal               Nose: without d/c or deformity               Neck: Neck supple. Gross normal ROM               Cardiovascular: Normal rate and regular rhythm.                 Pulmonary/Chest: Effort normal and breath sounds without rales or wheezing.                Abd:  Soft, NT, ND, + BS, no organomegaly               Neurological: Pt is alert. At baseline orientation, motor grossly intact               Skin: Skin is warm. No rashes, no other new lesions, LE edema - LLE with diffuse mild swelling and tender calf               Psychiatric: Pt behavior is normal without agitation   Micro: none  Cardiac tracings I have personally interpreted today:  none  Pertinent Radiological findings (summarize): none   Lab Results  Component Value Date   WBC 8.3 04/26/2022   HGB 9.5 (L) 04/26/2022   HCT 29.3 (L) 04/26/2022   PLT 493.0 (H) 04/26/2022   GLUCOSE 93 04/26/2022   CHOL 176 10/11/2021   TRIG 84.0 10/11/2021   HDL 80.30 10/11/2021   LDLDIRECT 83.3 11/17/2012   LDLCALC 79 10/11/2021   ALT 20 04/26/2022  AST 31 04/26/2022   NA 136 04/26/2022   K 4.2 04/26/2022   CL 101 04/26/2022   CREATININE 0.87 04/26/2022   BUN 29 (H) 04/26/2022   CO2 27 04/26/2022   TSH 0.68 01/26/2022   HGBA1C 6.6 (H) 10/11/2021   Assessment/Plan:  Briana Schroeder is a 76 y.o. Black or African American [2] female with  has a past medical history of ALLERGIC RHINITIS (08/02/2009), Allergy, ANXIETY (10/18/2008), ARM PAIN, RIGHT  (04/04/2009), Arthritis, BACK PAIN (08/02/2009), CHEST PAIN (12/05/2009), COLONIC POLYPS, HX OF (10/18/2008), DIZZINESS (12/05/2009), EPIGASTRIC PAIN (01/09/2010), FATIGUE (09/06/2009), GERD (12/06/2009), HOARSENESS (09/06/2009), HYPERLIPIDEMIA (10/18/2008), HYPERTENSION (10/18/2008), IBS (10/18/2008), MYALGIA (03/21/2009), Osteopenia (12/2018), PULMONARY SARCOIDOSIS (10/18/2008), SINUSITIS- ACUTE-NOS (03/21/2009), and VOCAL CORD NODULE (09/06/2009).  Pain and swelling of left lower leg With some suspicion for dvt - for lle venous doppler stat  Chronic pain of both shoulders Etiology unclear, for sed rate, mobic 15 mg prn to continue, ok to take the tramadol prn, hold on prednisone for now, and f/u rheum Aug 29, 2022 as planned  Chest pain Atypical, very low suspicion cardac, declines ecg, for CXR ,  to f/u any worsening symptoms or concerns    B12 deficiency Lab Results  Component Value Date   VITAMINB12 1,459 (H) 04/26/2022   Stable, cont oral replacement - b12 1000 mcg qd   Anemia Lab Results  Component Value Date   WBC 8.3 04/26/2022   HGB 9.5 (L) 04/26/2022   HCT 29.3 (L) 04/26/2022   MCV 81.6 04/26/2022   PLT 493.0 (H) 04/26/2022  also for f/u iron panel, ferritin  Followup: Return in about 4 months (around 08/27/2022).  Cathlean Cower, MD 04/28/2022 1:52 PM [facility]

## 2022-04-28 ENCOUNTER — Encounter: Payer: Self-pay | Admitting: Internal Medicine

## 2022-04-28 DIAGNOSIS — R079 Chest pain, unspecified: Secondary | ICD-10-CM | POA: Insufficient documentation

## 2022-04-28 DIAGNOSIS — E538 Deficiency of other specified B group vitamins: Secondary | ICD-10-CM | POA: Insufficient documentation

## 2022-04-28 DIAGNOSIS — D649 Anemia, unspecified: Secondary | ICD-10-CM | POA: Insufficient documentation

## 2022-04-28 DIAGNOSIS — M79662 Pain in left lower leg: Secondary | ICD-10-CM | POA: Insufficient documentation

## 2022-04-28 DIAGNOSIS — G8929 Other chronic pain: Secondary | ICD-10-CM | POA: Insufficient documentation

## 2022-04-28 NOTE — Assessment & Plan Note (Signed)
Lab Results  Component Value Date   VITAMINB12 1,459 (H) 04/26/2022   Stable, cont oral replacement - b12 1000 mcg qd

## 2022-04-28 NOTE — Assessment & Plan Note (Signed)
Etiology unclear, for sed rate, mobic 15 mg prn to continue, ok to take the tramadol prn, hold on prednisone for now, and f/u rheum Aug 29, 2022 as planned

## 2022-04-28 NOTE — Assessment & Plan Note (Signed)
With some suspicion for dvt - for lle venous doppler stat

## 2022-04-28 NOTE — Assessment & Plan Note (Signed)
Atypical, very low suspicion cardac, declines ecg, for CXR ,  to f/u any worsening symptoms or concerns

## 2022-04-28 NOTE — Assessment & Plan Note (Signed)
Lab Results  Component Value Date   WBC 8.3 04/26/2022   HGB 9.5 (L) 04/26/2022   HCT 29.3 (L) 04/26/2022   MCV 81.6 04/26/2022   PLT 493.0 (H) 04/26/2022  also for f/u iron panel, ferritin

## 2022-05-01 ENCOUNTER — Ambulatory Visit (INDEPENDENT_AMBULATORY_CARE_PROVIDER_SITE_OTHER): Payer: Medicare Other | Admitting: Family Medicine

## 2022-05-01 ENCOUNTER — Ambulatory Visit: Payer: Self-pay

## 2022-05-01 VITALS — BP 142/78 | HR 81 | Ht 63.0 in | Wt 114.0 lb

## 2022-05-01 DIAGNOSIS — M25511 Pain in right shoulder: Secondary | ICD-10-CM

## 2022-05-01 DIAGNOSIS — G8929 Other chronic pain: Secondary | ICD-10-CM

## 2022-05-01 DIAGNOSIS — M25512 Pain in left shoulder: Secondary | ICD-10-CM

## 2022-05-01 NOTE — Patient Instructions (Addendum)
Thank you for coming in today.   You received an injection today. Seek immediate medical attention if the joint becomes red, extremely painful, or is oozing fluid.   We can do the shot every 3 months.  That would be early Feb.   Continue home exercises.   Recheck as needed.

## 2022-05-01 NOTE — Progress Notes (Signed)
I, Briana Schroeder, LAT, ATC acting as a scribe for Briana Leader, MD.  Briana Schroeder is a 76 y.o. female who presents to [place] at [facility] today for continued bilateral shoulder pain.  Patient was last seen by Dr. Georgina Snell on 01/17/2022 and was given bilateral subacromial steroid injections and was advised to cont HEP. Today, pt reports shoulder pain has returned over the last month, R>L.    Dx imaging: 12/27/21 Chest XR  08/09/21 chest & L shoulder XR  07/23/20 R shoulder MRI  04/07/20 R shoulder XR  06/27/17 L shoulder XR  05/15/17 Chest XR   Pertinent review of systems: No fevers or chills  Relevant historical information: Hypertension   Exam:  BP (!) 142/78   Pulse 81   Ht '5\' 3"'$  (1.6 m)   Wt 114 lb (51.7 kg)   SpO2 98%   BMI 20.19 kg/m  General: Well Developed, well nourished, and in no acute distress.   MSK: Right shoulder normal-appearing normal motion pain with abduction.  Positive Hawkins and Neer's test.  Intact strength.  Left shoulder: Normal-appearing Normal motion.  Pain with abduction.  Positive Hawkins and Neer's test.  Intact strength.    Lab and Radiology Results  Procedure: Real-time Ultrasound Guided Injection of right shoulder subacromial bursa Device: Philips Affiniti 50G Images permanently stored and available for review in PACS Verbal informed consent obtained.  Discussed risks and benefits of procedure. Warned about infection, bleeding, hyperglycemia damage to structures among others. Patient expresses understanding and agreement Time-out conducted.   Noted no overlying erythema, induration, or other signs of local infection.   Skin prepped in a sterile fashion.   Local anesthesia: Topical Ethyl chloride.   With sterile technique and under real time ultrasound guidance: 40 mg of Kenalog and 2 mL of Marcaine injected into subacromial bursa. Fluid seen entering the bursa.   Completed without difficulty   Pain immediately  resolved suggesting accurate placement of the medication.   Advised to call if fevers/chills, erythema, induration, drainage, or persistent bleeding.   Images permanently stored and available for review in the ultrasound unit.  Impression: Technically successful ultrasound guided injection.    Procedure: Real-time Ultrasound Guided Injection of left shoulder subacromial bursa Device: Philips Affiniti 50G Images permanently stored and available for review in PACS Verbal informed consent obtained.  Discussed risks and benefits of procedure. Warned about infection, bleeding, hyperglycemia damage to structures among others. Patient expresses understanding and agreement Time-out conducted.   Noted no overlying erythema, induration, or other signs of local infection.   Skin prepped in a sterile fashion.   Local anesthesia: Topical Ethyl chloride.   With sterile technique and under real time ultrasound guidance: 40 mg of Kenalog and 2 mL of Marcaine injected into subacromial bursa. Fluid seen entering the bursa.   Completed without difficulty   Pain immediately resolved suggesting accurate placement of the medication.   Advised to call if fevers/chills, erythema, induration, drainage, or persistent bleeding.   Images permanently stored and available for review in the ultrasound unit.  Impression: Technically successful ultrasound guided injection.        Assessment and Plan: 76 y.o. female with bilateral shoulder pain thought to be due to rotator cuff tendinopathy/tear and bursitis.  She has a right-sided MRI from January 2022 that she has significant rotator cuff tendinopathy and a partial tear.  She likely has a similar problem on her left.  She would like to avoid surgery however therefore we are  proceeding with continued conservative management including intermittent injections and continued home exercise program.  Plan for injection today and home exercise program.  Recheck back as  needed.  We can proceed with next set of injections as early as February 2024.   PDMP not reviewed this encounter. Orders Placed This Encounter  Procedures   [name] LIMITED JOINT SPACE STRUCTURES UP BILAT(NO LINKED CHARGES)    Order Specific Question:   Reason for Exam (SYMPTOM  OR DIAGNOSIS REQUIRED)    Answer:   bilateral shoulder pain    Order Specific Question:   Preferred imaging location?    Answer:   [facility]   No orders of the defined types were placed in this encounter.    Discussed warning signs or symptoms. Please see discharge instructions. Patient expresses understanding.   The above documentation has been reviewed and is accurate and complete Briana Schroeder, M.D.

## 2022-05-02 ENCOUNTER — Ambulatory Visit (HOSPITAL_COMMUNITY): Payer: Medicare Other

## 2022-05-02 ENCOUNTER — Telehealth: Payer: Self-pay

## 2022-05-02 ENCOUNTER — Ambulatory Visit: Payer: Medicare Other

## 2022-05-02 NOTE — Telephone Encounter (Signed)
Called patient lvm to return call, to complete AWV. Patient may reschedule for the next available appointment NHA or CMA. -S. Keoni Havey,LPN 

## 2022-05-03 ENCOUNTER — Ambulatory Visit: Payer: Medicare Other | Admitting: Internal Medicine

## 2022-05-04 ENCOUNTER — Ambulatory Visit (HOSPITAL_COMMUNITY)
Admission: RE | Admit: 2022-05-04 | Discharge: 2022-05-04 | Disposition: A | Discharge status: Home / Self Care | Payer: Medicare Other | Source: Ambulatory Visit | Attending: Cardiovascular Disease | Admitting: Cardiovascular Disease

## 2022-05-04 DIAGNOSIS — M79662 Pain in left lower leg: Secondary | ICD-10-CM | POA: Insufficient documentation

## 2022-05-04 DIAGNOSIS — M7989 Other specified soft tissue disorders: Secondary | ICD-10-CM

## 2022-05-09 ENCOUNTER — Ambulatory Visit
Admission: RE | Admit: 2022-05-09 | Discharge: 2022-05-09 | Disposition: A | Discharge status: Home / Self Care | Payer: Medicare Other | Source: Ambulatory Visit | Attending: Internal Medicine | Admitting: Internal Medicine

## 2022-05-09 ENCOUNTER — Other Ambulatory Visit: Payer: Self-pay | Admitting: Internal Medicine

## 2022-05-09 DIAGNOSIS — Z1231 Encounter for screening mammogram for malignant neoplasm of breast: Secondary | ICD-10-CM | POA: Diagnosis not present

## 2022-05-31 DIAGNOSIS — H16143 Punctate keratitis, bilateral: Secondary | ICD-10-CM | POA: Diagnosis not present

## 2022-06-11 ENCOUNTER — Other Ambulatory Visit: Payer: Self-pay | Admitting: Internal Medicine

## 2022-06-14 ENCOUNTER — Ambulatory Visit (INDEPENDENT_AMBULATORY_CARE_PROVIDER_SITE_OTHER): Payer: Medicare Other | Admitting: Family Medicine

## 2022-06-14 VITALS — BP 130/76 | HR 76 | Ht 63.0 in | Wt 115.0 lb

## 2022-06-14 DIAGNOSIS — M353 Polymyalgia rheumatica: Secondary | ICD-10-CM | POA: Diagnosis not present

## 2022-06-14 DIAGNOSIS — G8929 Other chronic pain: Secondary | ICD-10-CM

## 2022-06-14 DIAGNOSIS — M25511 Pain in right shoulder: Secondary | ICD-10-CM | POA: Diagnosis not present

## 2022-06-14 DIAGNOSIS — M25512 Pain in left shoulder: Secondary | ICD-10-CM

## 2022-06-14 MED ORDER — PREDNISONE 5 MG (48) PO TBPK: ORAL_TABLET | ORAL | 0 refills | Status: DC

## 2022-06-14 NOTE — Progress Notes (Signed)
I, Peterson Lombard, LAT, ATC acting as a scribe for [name], MD.  Briana Schroeder is a 76 y.o. female who presents to [place] at [facility] today for cont'd bilat shoulder pain. Pt was last seen by Dr. Georgina Snell on 05/01/22 and was given bilat subacromial steroid injections and was advised to work on HEP. Today, pt reports bilateral shoulder and upper extremity pain along with some lower hip pain laterally bilaterally left worse than right.  She notes her primary care provider did some labs recently and found elevated sed rate and CK and referred her to rheumatology.  She is scheduled to see Dr. Estanislado Pandy in mid February.  She has never seen a rheumatologist before.  Dx imaging: 12/27/21 Chest XR             08/09/21 chest & L shoulder XR             07/23/20 R shoulder MRI             04/07/20 R shoulder XR             06/27/17 L shoulder XR             05/15/17 Chest XR  Pertinent review of systems: No fevers or chills  Relevant historical information: No known history of polymyalgia rheumatica.   Exam:  BP 130/76   Pulse 76   Ht '5\' 3"'$  (1.6 m)   Wt 115 lb (52.2 kg)   SpO2 99%   BMI 20.37 kg/m  General: Well Developed, well nourished, and in no acute distress.   MSK: Shoulders normal-appearing normal motion pain with abduction.  Diminished strength abduction. Skin: No visible rash on upper extremities.  No Groton's papules hands bilaterally.    Lab and Radiology Results  Recent Results (from the past 2160 hour(s))  VITAMIN D 25 Hydroxy (Vit-D Deficiency, Fractures)     Status: None   Collection Time: 04/26/22 10:33 AM  Result Value Ref Range   VITD 61.69 30.00 - 100.00 ng/mL  Vitamin B12     Status: Abnormal   Collection Time: 04/26/22 10:33 AM  Result Value Ref Range   Vitamin B-12 1,459 (H) 211 - 911 pg/mL  Ferritin     Status: Abnormal   Collection Time: 04/26/22 10:33 AM  Result Value Ref Range   Ferritin 317.0 (H) 10.0 - 291.0 ng/mL  IBC  panel     Status: Abnormal   Collection Time: 04/26/22 10:33 AM  Result Value Ref Range   Iron 26 (L) 42 - 145 ug/dL   Transferrin 169.0 (L) 212.0 - 360.0 mg/dL   Saturation Ratios 11.0 (L) 20.0 - 50.0 %   TIBC 236.6 (L) 250.0 - 450.0 mcg/dL  Basic metabolic panel     Status: Abnormal   Collection Time: 04/26/22 10:33 AM  Result Value Ref Range   Sodium 136 135 - 145 mEq/L   Potassium 4.2 3.5 - 5.1 mEq/L   Chloride 101 96 - 112 mEq/L   CO2 27 19 - 32 mEq/L   Glucose, Bld 93 70 - 99 mg/dL   BUN 29 (H) 6 - 23 mg/dL   Creatinine, Ser 0.87 0.40 - 1.20 mg/dL   GFR 64.19 >60.00 mL/min    Comment: Calculated using the CKD-EPI Creatinine Equation (2021)   Calcium 9.9 8.4 - 10.5 mg/dL  CBC with Differential/Platelet     Status: Abnormal   Collection Time: 04/26/22 10:33 AM  Result Value Ref Range   WBC  8.3 4.0 - 10.5 K/uL   RBC 3.59 (L) 3.87 - 5.11 Mil/uL   Hemoglobin 9.5 (L) 12.0 - 15.0 g/dL   HCT 29.3 (L) 36.0 - 46.0 %   MCV 81.6 78.0 - 100.0 fl   MCHC 32.5 30.0 - 36.0 g/dL   RDW 17.8 (H) 11.5 - 15.5 %   Platelets 493.0 (H) 150.0 - 400.0 K/uL   Neutrophils Relative % 74.0 43.0 - 77.0 %   Lymphocytes Relative 14.8 12.0 - 46.0 %   Monocytes Relative 8.6 3.0 - 12.0 %   Eosinophils Relative 1.8 0.0 - 5.0 %   Basophils Relative 0.8 0.0 - 3.0 %   Neutro Abs 6.2 1.4 - 7.7 K/uL   Lymphs Abs 1.2 0.7 - 4.0 K/uL   Monocytes Absolute 0.7 0.1 - 1.0 K/uL   Eosinophils Absolute 0.2 0.0 - 0.7 K/uL   Basophils Absolute 0.1 0.0 - 0.1 K/uL  Hepatic function panel     Status: None   Collection Time: 04/26/22 10:33 AM  Result Value Ref Range   Total Bilirubin 0.3 0.2 - 1.2 mg/dL   Bilirubin, Direct 0.1 0.0 - 0.3 mg/dL   Alkaline Phosphatase 43 39 - 117 U/L   AST 31 0 - 37 U/L   ALT 20 0 - 35 U/L   Total Protein 7.7 6.0 - 8.3 g/dL   Albumin 3.6 3.5 - 5.2 g/dL  C-reactive protein     Status: None   Collection Time: 04/26/22 10:33 AM  Result Value Ref Range   CRP 7.2 0.5 - 20.0 mg/dL   Sedimentation rate     Status: Abnormal   Collection Time: 04/26/22 10:33 AM  Result Value Ref Range   Sed Rate 116 (H) 0 - 30 mm/hr  CK     Status: Abnormal   Collection Time: 04/26/22 10:33 AM  Result Value Ref Range   Total CK 780 (H) 7 - 177 U/L       Assessment and Plan: 76 y.o. female with bilateral shoulder and upper extremity pain associated with elevated sed rate and CK.  This is concerning for polymyalgia rheumatica potentially dermatomyositis.  She does not have the skin rash that I would expect for dermatomyositis.  Regardless I am concerned she has a rheumatologic problem that could explain her proximal muscle pain and weakness.  She could have some overlap with orthopedic issues such as previously suspected subacromial bursitis and shoulder pain.  Additionally she could have some trochanteric bursitis as well.  Plan to treat with oral prednisone and refer to rheumatology.  She does have an existing rheumatology referral scheduled for mid to late February.  However I think that is just too long and can get her in with [facility] rheumatology sooner.  Will change the referral to sooner time.   PDMP not reviewed this encounter. Orders Placed This Encounter  Procedures   Ambulatory referral to Rheumatology    Referral Priority:   Routine    Referral Type:   Consultation    Referral Reason:   Specialty Services Required    Requested Specialty:   Rheumatology    Number of Visits Requested:   1   Meds ordered this encounter  Medications   predniSONE (STERAPRED UNI-PAK 48 TAB) 5 MG (48) TBPK tablet    Sig: 12 day dosepack po    Dispense:  48 tablet    Refill:  0     Discussed warning signs or symptoms. Please see discharge instructions. Patient expresses understanding.  The above documentation has been reviewed and is accurate and complete [name], M.D.

## 2022-06-14 NOTE — Patient Instructions (Signed)
Thank you for coming in today.   I am worried that you may have polymyalgia rheumatica.   I am referring you to a different rheumatologist that will likely get you in sooner.   Let me know if you do not hear from [facility] in a timely manner.   Take the prednisone for 12 days.   I expect this will help a lot.    Polymyalgia Rheumatica Polymyalgia rheumatica (PMR) is an inflammatory disorder that causes the muscles and joints to ache and become stiff. Sometimes, PMR leads to a more dangerous condition that can cause vision loss (temporal arteritis or giant cell arteritis). What are the causes? The exact cause of PMR is not known. What increases the risk? You are more likely to develop this condition if you are: Female. 77 years old or older. Of [place] European descent. What are the signs or symptoms? Pain and stiffness are the main symptoms of PMR and affect both sides of the body. These symptoms: May be worse after inactivity and in the morning. May affect your: Hips, buttocks, and thighs. Neck, arms, and shoulders. This can make it hard to raise your arms above your head. Hands and wrists. Other symptoms include: Fever. Tiredness. Weakness. Depression. Decreased appetite. This may lead to weight loss. Symptoms could start slowly but often come on suddenly, sometimes even overnight, or over a few days. How is this diagnosed? This condition is diagnosed with your medical history and a physical exam. You may need to see a health care provider who specializes in diseases of the joints, muscles, and bones (rheumatologist). You may also have tests, including: Blood tests. Often, test results that show inflammation are very elevated. Imaging studies like X-rays or ultrasound, which may be done to rule out other conditions. These are often usual in PMR. How is this treated? PMR usually goes away without treatment, but it may take years. PMR often  responds rapidly (within a few days) to low-dose glucocorticoids (steroids). These medicines have serious side effects. Once your symptoms are better controlled, the dose should be lowered to find the lowest possible dose that controls your symptoms. Regular exercise and rest will also help your symptoms. Follow these instructions at home:  Take over-the-counter and prescription medicines only as told by your health care provider. Make sure to get enough rest and sleep. Eat a healthy and nutritious diet that includes calcium and vitamin D. Calcium is important for bone health, and vitamin D helps your body absorb calcium. Good sources of calcium and vitamin D include: Certain fatty fish, such as salmon and tuna. Products that have calcium and vitamin D added to them (are fortified), such as fortified cereals. Collard greens, turnip greens, broccoli, and kale. Egg yolks. Cheese. Liver. Try to exercise most days of the week. Ask your health care provider what type of exercises are best for you. Keep all follow-up visits. This is important. Contact a health care provider if: Your symptoms do not improve with medicine. You have side effects from steroids. These may include: Weight gain. Swelling. Insomnia. Mood changes. Bruising. High blood sugar readings, if you have diabetes. Higher than normal blood pressure readings, if you monitor your blood pressure. Get help right away if: You develop symptoms of temporal arteritis, such as: A change in vision. Severe headache. Scalp pain. Jaw pain. These symptoms may represent a serious problem that is an emergency. Do not wait to see if the symptoms will go away. Get medical help right  away. Call your local emergency services (911 in the [place] Do not drive yourself to the hospital. Summary Polymyalgia rheumatica is an inflammatory disorder that causes aching and stiffness in your muscles and joints. The exact cause of this condition is not  known. This condition usually goes away without treatment. Your health care provider may give you low-dose glucocorticoids (steroids) to help manage your pain and stiffness. Rest and regular exercise will help the symptoms. This information is not intended to replace advice given to you by your health care provider. Make sure you discuss any questions you have with your health care provider. Document Revised: 10/20/2020 Document Reviewed: 10/20/2020 Elsevier Patient Education  [place].

## 2022-06-19 DIAGNOSIS — M25551 Pain in right hip: Secondary | ICD-10-CM | POA: Diagnosis not present

## 2022-06-19 DIAGNOSIS — M2550 Pain in unspecified joint: Secondary | ICD-10-CM | POA: Diagnosis not present

## 2022-06-19 DIAGNOSIS — R748 Abnormal levels of other serum enzymes: Secondary | ICD-10-CM | POA: Diagnosis not present

## 2022-06-19 DIAGNOSIS — Z1159 Encounter for screening for other viral diseases: Secondary | ICD-10-CM | POA: Diagnosis not present

## 2022-06-19 DIAGNOSIS — M791 Myalgia, unspecified site: Secondary | ICD-10-CM | POA: Diagnosis not present

## 2022-06-19 DIAGNOSIS — M1990 Unspecified osteoarthritis, unspecified site: Secondary | ICD-10-CM | POA: Diagnosis not present

## 2022-06-19 DIAGNOSIS — M353 Polymyalgia rheumatica: Secondary | ICD-10-CM | POA: Diagnosis not present

## 2022-06-19 DIAGNOSIS — M25552 Pain in left hip: Secondary | ICD-10-CM | POA: Diagnosis not present

## 2022-06-19 DIAGNOSIS — M25512 Pain in left shoulder: Secondary | ICD-10-CM | POA: Diagnosis not present

## 2022-06-19 DIAGNOSIS — R634 Abnormal weight loss: Secondary | ICD-10-CM | POA: Diagnosis not present

## 2022-06-19 DIAGNOSIS — Z79899 Other long term (current) drug therapy: Secondary | ICD-10-CM | POA: Diagnosis not present

## 2022-06-19 DIAGNOSIS — M609 Myositis, unspecified: Secondary | ICD-10-CM | POA: Diagnosis not present

## 2022-06-19 DIAGNOSIS — M25511 Pain in right shoulder: Secondary | ICD-10-CM | POA: Diagnosis not present

## 2022-06-28 DIAGNOSIS — M1990 Unspecified osteoarthritis, unspecified site: Secondary | ICD-10-CM | POA: Diagnosis not present

## 2022-06-28 DIAGNOSIS — R768 Other specified abnormal immunological findings in serum: Secondary | ICD-10-CM | POA: Diagnosis not present

## 2022-06-28 DIAGNOSIS — R634 Abnormal weight loss: Secondary | ICD-10-CM | POA: Diagnosis not present

## 2022-06-28 DIAGNOSIS — Z79899 Other long term (current) drug therapy: Secondary | ICD-10-CM | POA: Diagnosis not present

## 2022-06-28 DIAGNOSIS — M353 Polymyalgia rheumatica: Secondary | ICD-10-CM | POA: Diagnosis not present

## 2022-06-28 DIAGNOSIS — R748 Abnormal levels of other serum enzymes: Secondary | ICD-10-CM | POA: Diagnosis not present

## 2022-06-28 DIAGNOSIS — M609 Myositis, unspecified: Secondary | ICD-10-CM | POA: Diagnosis not present

## 2022-07-02 ENCOUNTER — Other Ambulatory Visit: Payer: Self-pay | Admitting: Internal Medicine

## 2022-07-02 NOTE — Telephone Encounter (Signed)
Please refill as per office routine med refill policy (all routine meds to be refilled for 3 mo or monthly (per pt preference) up to one year from last visit, then month to month grace period for 3 mo, then further med refills will have to be denied) ? ?

## 2022-07-05 ENCOUNTER — Telehealth: Payer: Self-pay | Admitting: Internal Medicine

## 2022-07-05 NOTE — Telephone Encounter (Signed)
Left message for patient to call back to schedule Medicare Annual Wellness Visit   Last AWV  11/17/19  Please schedule at anytime with [facility] if patient calls the office back.      Any questions, please call me at [phone]

## 2022-07-07 ENCOUNTER — Other Ambulatory Visit: Payer: Self-pay | Admitting: Internal Medicine

## 2022-07-13 ENCOUNTER — Other Ambulatory Visit: Payer: Self-pay | Admitting: Internal Medicine

## 2022-07-13 ENCOUNTER — Other Ambulatory Visit: Payer: Self-pay

## 2022-07-13 NOTE — Telephone Encounter (Signed)
Please refill as per office routine med refill policy (all routine meds to be refilled for 3 mo or monthly (per pt preference) up to one year from last visit, then month to month grace period for 3 mo, then further med refills will have to be denied)

## 2022-07-30 ENCOUNTER — Other Ambulatory Visit: Payer: Self-pay | Admitting: General Surgery

## 2022-07-30 DIAGNOSIS — M79601 Pain in right arm: Secondary | ICD-10-CM | POA: Diagnosis not present

## 2022-07-30 DIAGNOSIS — M79602 Pain in left arm: Secondary | ICD-10-CM | POA: Diagnosis not present

## 2022-07-30 DIAGNOSIS — R29898 Other symptoms and signs involving the musculoskeletal system: Secondary | ICD-10-CM | POA: Insufficient documentation

## 2022-07-31 DIAGNOSIS — G5731 Lesion of lateral popliteal nerve, right lower limb: Secondary | ICD-10-CM | POA: Diagnosis not present

## 2022-08-03 ENCOUNTER — Telehealth: Payer: Self-pay

## 2022-08-03 NOTE — Telephone Encounter (Signed)
Left message for patient to call back to schedule Medicare Annual Wellness Visit   Last AWV  11/17/19  Please schedule at anytime with [facility] if patient calls the office back.    30 Minutes appointment   Any questions, please call me at [phone]

## 2022-08-04 ENCOUNTER — Other Ambulatory Visit: Payer: Self-pay | Admitting: Internal Medicine

## 2022-08-09 NOTE — Patient Instructions (Signed)
DUE TO COVID-19 ONLY TWO VISITORS  (aged 78 and older)  ARE ALLOWED TO COME WITH YOU AND STAY IN THE WAITING ROOM ONLY DURING PRE OP AND PROCEDURE.   **NO VISITORS ARE ALLOWED IN THE SHORT STAY AREA OR RECOVERY ROOM!!**  IF YOU WILL BE ADMITTED INTO THE HOSPITAL YOU ARE ALLOWED ONLY FOUR SUPPORT PEOPLE DURING VISITATION HOURS ONLY (7 AM -8PM)   The support person(s) must pass our screening, gel in and out, and wear a mask at all times, including in the patient's room. Patients must also wear a mask when staff or their support person are in the room. Visitors GUEST BADGE MUST BE WORN VISIBLY  One adult visitor may remain with you overnight and MUST be in the room by 8 P.M.     Your procedure is scheduled on: 08/23/22   Report to [facility] Main Entrance    Report to admitting at: 5:15 AM   Call this number if you have problems the morning of surgery [phone]   Do not eat food :After Midnight.   After Midnight you may have the following liquids until: 4:30 AM DAY OF SURGERY  Water Black Coffee (sugar ok, NO MILK/CREAM OR CREAMERS)  Tea (sugar ok, NO MILK/CREAM OR CREAMERS) regular and decaf                             Plain Jell-O (NO RED)                                           Fruit ices (not with fruit pulp, NO RED)                                     Popsicles (NO RED)                                                                  Juice: apple, WHITE grape, WHITE cranberry Sports drinks like Gatorade (NO RED)    Oral Hygiene is also important to reduce your risk of infection.                                    Remember - BRUSH YOUR TEETH THE MORNING OF SURGERY WITH YOUR REGULAR TOOTHPASTE  DENTURES WILL BE REMOVED PRIOR TO SURGERY PLEASE DO NOT APPLY "Poly grip" OR ADHESIVES!!!   Do NOT smoke after Midnight   Take these medicines the morning of surgery with A SIP OF WATER:cetirizine,amlodipine,pantoprazole.                               You may not have any  metal on your body including hair pins, jewelry, and body piercing             Do not wear make-up, lotions, powders, perfumes/cologne, or deodorant  Do not wear nail polish including gel and S&S, artificial/acrylic nails, or any other  type of covering on natural nails including finger and toenails. If you have artificial nails, gel coating, etc. that needs to be removed by a nail salon please have this removed prior to surgery or surgery may need to be canceled/ delayed if the surgeon/ anesthesia feels like they are unable to be safely monitored.   Do not shave  48 hours prior to surgery.    Do not bring valuables to the hospital. [place].   Contacts, glasses, or bridgework may not be worn into surgery.   Bring small overnight bag day of surgery.   DO NOT [place]. PHARMACY WILL DISPENSE MEDICATIONS LISTED ON YOUR MEDICATION LIST TO YOU DURING YOUR ADMISSION [place]!    Patients discharged on the day of surgery will not be allowed to drive home.  Someone NEEDS to stay with you for the first 24 hours after anesthesia.   Special Instructions: Bring a copy of your healthcare power of attorney and living will documents         the day of surgery if you haven't scanned them before.              Please read over the following fact sheets you were given: IF YOU HAVE QUESTIONS ABOUT YOUR PRE-OP INSTRUCTIONS PLEASE CALL [phone]    [facility] - Preparing for Surgery Before surgery, you can play an important role.  Because skin is not sterile, your skin needs to be as free of germs as possible.  You can reduce the number of germs on your skin by washing with CHG (chlorahexidine gluconate) soap before surgery.  CHG is an antiseptic cleaner which kills germs and bonds with the skin to continue killing germs even after washing. Please DO NOT use if you have an allergy to CHG or antibacterial soaps.  If  your skin becomes reddened/irritated stop using the CHG and inform your nurse when you arrive at Short Stay. Do not shave (including legs and underarms) for at least 48 hours prior to the first CHG shower.  You may shave your face/neck. Please follow these instructions carefully:  1.  Shower with CHG Soap the night before surgery and the  morning of Surgery.  2.  If you choose to wash your hair, wash your hair first as usual with your  normal  shampoo.  3.  After you shampoo, rinse your hair and body thoroughly to remove the  shampoo.                           4.  Use CHG as you would any other liquid soap.  You can apply chg directly  to the skin and wash                       Gently with a scrungie or clean washcloth.  5.  Apply the CHG Soap to your body ONLY FROM THE NECK DOWN.   Do not use on face/ open                           Wound or open sores. Avoid contact with eyes, ears mouth and genitals (private parts).  Wash face,  Genitals (private parts) with your normal soap.             6.  Wash thoroughly, paying special attention to the area where your surgery  will be performed.  7.  Thoroughly rinse your body with warm water from the neck down.  8.  DO NOT shower/wash with your normal soap after using and rinsing off  the CHG Soap.                9.  Pat yourself dry with a clean towel.            10.  Wear clean pajamas.            11.  Place clean sheets on your bed the night of your first shower and do not  sleep with pets. Day of Surgery : Do not apply any lotions/deodorants the morning of surgery.  Please wear clean clothes to the hospital/surgery center.  FAILURE TO FOLLOW THESE INSTRUCTIONS MAY RESULT IN THE CANCELLATION OF YOUR SURGERY PATIENT SIGNATURE_________________________________  NURSE SIGNATURE__________________________________  ________________________________________________________________________

## 2022-08-10 ENCOUNTER — Encounter (HOSPITAL_COMMUNITY): Payer: Self-pay

## 2022-08-10 ENCOUNTER — Encounter (HOSPITAL_COMMUNITY)
Admission: RE | Admit: 2022-08-10 | Discharge: 2022-08-10 | Disposition: A | Discharge status: Home / Self Care | Payer: Medicare Other | Source: Ambulatory Visit | Attending: General Surgery | Admitting: General Surgery

## 2022-08-10 ENCOUNTER — Other Ambulatory Visit: Payer: Self-pay

## 2022-08-10 VITALS — BP 133/97 | HR 88 | Temp 98.2°F | Ht 63.0 in | Wt 133.0 lb

## 2022-08-10 DIAGNOSIS — Z01818 Encounter for other preprocedural examination: Secondary | ICD-10-CM | POA: Diagnosis not present

## 2022-08-10 DIAGNOSIS — I10 Essential (primary) hypertension: Secondary | ICD-10-CM | POA: Diagnosis not present

## 2022-08-10 LAB — BASIC METABOLIC PANEL
Anion gap: 7 (ref 5–15)
BUN: 20 mg/dL (ref 8–23)
CO2: 27 mmol/L (ref 22–32)
Calcium: 9.1 mg/dL (ref 8.9–10.3)
Chloride: 104 mmol/L (ref 98–111)
Creatinine, Ser: 0.78 mg/dL (ref 0.44–1.00)
GFR, Estimated: >60 mL/min (ref >60)
Glucose, Bld: 115 mg/dL — ABNORMAL HIGH (ref 70–99)
Potassium: 3.9 mmol/L (ref 3.5–5.1)
Sodium: 138 mmol/L (ref 135–145)

## 2022-08-10 LAB — CBC
HCT: 41.2 % (ref 36.0–46.0)
Hemoglobin: 13 g/dL (ref 12.0–15.0)
MCH: 29 pg (ref 26.0–34.0)
MCHC: 31.6 g/dL (ref 30.0–36.0)
MCV: 91.8 fL (ref 80.0–100.0)
Platelets: 256 10*3/uL (ref 150–400)
RBC: 4.49 MIL/uL (ref 3.87–5.11)
RDW: 17.6 % — ABNORMAL HIGH (ref 11.5–15.5)
WBC: 6.7 10*3/uL (ref 4.0–10.5)
nRBC: 0 % (ref 0.0–0.2)

## 2022-08-10 NOTE — Progress Notes (Signed)
For Short Stay: [place] appointment date:  Bowel Prep reminder:   For Anesthesia: PCP - Dr. Cathlean Cower Cardiologist - N/A  Chest x-ray - 04/28/22 EKG -  Stress Test -  ECHO - 06/05/21 Cardiac Cath -  Pacemaker/ICD device last checked: Pacemaker orders received: Device Rep notified:  Spinal Cord Stimulator:  Sleep Study -  CPAP -   Fasting Blood Sugar -  Checks Blood Sugar _____ times a day Date and result of last Hgb A1c-  Last dose of GLP1 agonist-  GLP1 instructions:   Last dose of SGLT-2 inhibitors-  SGLT-2 instructions:   Blood Thinner Instructions: Aspirin Instructions: Last Dose:  Activity level: Can go up a flight of stairs and activities of daily living without stopping and without chest pain and/or shortness of breath   Able to exercise without chest pain and/or shortness of breath  Anesthesia review:   Patient denies shortness of breath, fever, cough and chest pain at PAT appointment   Patient verbalized understanding of instructions that were given to them at the PAT appointment. Patient was also instructed that they will need to review over the PAT instructions again at home before surgery.

## 2022-08-11 ENCOUNTER — Other Ambulatory Visit: Payer: Self-pay | Admitting: Internal Medicine

## 2022-08-14 DIAGNOSIS — M609 Myositis, unspecified: Secondary | ICD-10-CM | POA: Diagnosis not present

## 2022-08-14 DIAGNOSIS — Z79899 Other long term (current) drug therapy: Secondary | ICD-10-CM | POA: Diagnosis not present

## 2022-08-14 DIAGNOSIS — R748 Abnormal levels of other serum enzymes: Secondary | ICD-10-CM | POA: Diagnosis not present

## 2022-08-14 DIAGNOSIS — R634 Abnormal weight loss: Secondary | ICD-10-CM | POA: Diagnosis not present

## 2022-08-14 DIAGNOSIS — M353 Polymyalgia rheumatica: Secondary | ICD-10-CM | POA: Diagnosis not present

## 2022-08-14 DIAGNOSIS — R768 Other specified abnormal immunological findings in serum: Secondary | ICD-10-CM | POA: Diagnosis not present

## 2022-08-14 DIAGNOSIS — M1990 Unspecified osteoarthritis, unspecified site: Secondary | ICD-10-CM | POA: Diagnosis not present

## 2022-08-22 NOTE — Anesthesia Preprocedure Evaluation (Signed)
Anesthesia Evaluation  Patient identified by MRN, date of birth, ID band Patient awake    Reviewed: Allergy & Precautions, NPO status , Patient's Chart, lab work & pertinent test results  History of Anesthesia Complications Negative for: history of anesthetic complications  Airway Mallampati: II  TM Distance: >3 FB Neck ROM: Full    Dental  (+) Missing, Partial Upper,    Pulmonary  sarcoidosis   Pulmonary exam normal        Cardiovascular hypertension, Pt. on medications Normal cardiovascular exam     Neuro/Psych   Anxiety        GI/Hepatic Neg liver ROS,GERD  Medicated,,  Endo/Other  negative endocrine ROS    Renal/GU negative Renal ROS  negative genitourinary   Musculoskeletal  (+) Arthritis ,  myositis   Abdominal   Peds  Hematology negative hematology ROS (+)   Anesthesia Other Findings Day of surgery medications reviewed with patient.  Reproductive/Obstetrics negative OB ROS                              Anesthesia Physical Anesthesia Plan  ASA: 2  Anesthesia Plan: General   Post-op Pain Management: Tylenol PO (pre-op)*   Induction: Intravenous  PONV Risk Score and Plan: 3 and Treatment may vary due to age or medical condition, Dexamethasone and Ondansetron  Airway Management Planned: LMA  Additional Equipment: None  Intra-op Plan:   Post-operative Plan: Extubation in OR  Informed Consent: I have reviewed the patients History and Physical, chart, labs and discussed the procedure including the risks, benefits and alternatives for the proposed anesthesia with the patient or authorized representative who has indicated his/her understanding and acceptance.     Dental advisory given  Plan Discussed with: CRNA  Anesthesia Plan Comments:         Anesthesia Quick Evaluation

## 2022-08-23 ENCOUNTER — Other Ambulatory Visit: Payer: Self-pay

## 2022-08-23 ENCOUNTER — Encounter (HOSPITAL_COMMUNITY)
Admission: RE | Disposition: A | Discharge status: Home / Self Care | Payer: Self-pay | Source: Ambulatory Visit | Attending: General Surgery

## 2022-08-23 ENCOUNTER — Ambulatory Visit (HOSPITAL_BASED_OUTPATIENT_CLINIC_OR_DEPARTMENT_OTHER): Payer: Medicare Other | Admitting: Certified Registered Nurse Anesthetist

## 2022-08-23 ENCOUNTER — Ambulatory Visit (HOSPITAL_COMMUNITY)
Admission: RE | Admit: 2022-08-23 | Discharge: 2022-08-23 | Disposition: A | Discharge status: Home / Self Care | Payer: Medicare Other | Source: Ambulatory Visit | Attending: General Surgery | Admitting: General Surgery

## 2022-08-23 ENCOUNTER — Encounter (HOSPITAL_COMMUNITY): Payer: Self-pay | Admitting: General Surgery

## 2022-08-23 ENCOUNTER — Ambulatory Visit (HOSPITAL_COMMUNITY): Payer: Medicare Other | Admitting: Certified Registered Nurse Anesthetist

## 2022-08-23 DIAGNOSIS — M6250 Muscle wasting and atrophy, not elsewhere classified, unspecified site: Secondary | ICD-10-CM | POA: Insufficient documentation

## 2022-08-23 DIAGNOSIS — K219 Gastro-esophageal reflux disease without esophagitis: Secondary | ICD-10-CM | POA: Diagnosis not present

## 2022-08-23 DIAGNOSIS — M62521 Muscle wasting and atrophy, not elsewhere classified, right upper arm: Secondary | ICD-10-CM

## 2022-08-23 DIAGNOSIS — M6281 Muscle weakness (generalized): Secondary | ICD-10-CM | POA: Insufficient documentation

## 2022-08-23 DIAGNOSIS — R531 Weakness: Secondary | ICD-10-CM | POA: Diagnosis not present

## 2022-08-23 DIAGNOSIS — I10 Essential (primary) hypertension: Secondary | ICD-10-CM | POA: Diagnosis not present

## 2022-08-23 HISTORY — PX: MUSCLE BIOPSY: SHX716

## 2022-08-23 SURGERY — MUSCLE BIOPSY
Anesthesia: General | Laterality: Right

## 2022-08-23 MED ORDER — ACETAMINOPHEN 500 MG PO TABS
1000.0000 mg | ORAL_TABLET | ORAL | Status: AC
  Administered 2022-08-23: 1000 mg via ORAL

## 2022-08-23 MED ORDER — PROPOFOL 1000 MG/100ML IV EMUL
INTRAVENOUS | Status: AC
  Filled 2022-08-23: qty 100

## 2022-08-23 MED ORDER — FENTANYL CITRATE (PF) 100 MCG/2ML IJ SOLN
INTRAMUSCULAR | Status: DC | PRN
  Administered 2022-08-23 (×2): 25 ug via INTRAVENOUS
  Administered 2022-08-23: 50 ug via INTRAVENOUS

## 2022-08-23 MED ORDER — BUPIVACAINE-EPINEPHRINE (PF) 0.25% -1:200000 IJ SOLN
INTRAMUSCULAR | Status: AC
  Filled 2022-08-23: qty 30

## 2022-08-23 MED ORDER — OXYCODONE HCL 5 MG PO TABS
5.0000 mg | ORAL_TABLET | Freq: Once | ORAL | Status: AC | PRN
  Administered 2022-08-23: 5 mg via ORAL

## 2022-08-23 MED ORDER — 0.9 % SODIUM CHLORIDE (POUR BTL) OPTIME
TOPICAL | Status: DC | PRN
  Administered 2022-08-23: 1000 mL

## 2022-08-23 MED ORDER — CEFAZOLIN SODIUM-DEXTROSE 2-4 GM/100ML-% IV SOLN
2.0000 g | INTRAVENOUS | Status: AC
  Administered 2022-08-23: 2 g via INTRAVENOUS
  Filled 2022-08-23: qty 100

## 2022-08-23 MED ORDER — FENTANYL CITRATE PF 50 MCG/ML IJ SOSY
25.0000 ug | PREFILLED_SYRINGE | INTRAMUSCULAR | Status: DC | PRN
  Administered 2022-08-23: 50 ug via INTRAVENOUS

## 2022-08-23 MED ORDER — DEXAMETHASONE SODIUM PHOSPHATE 10 MG/ML IJ SOLN
INTRAMUSCULAR | Status: AC
  Filled 2022-08-23: qty 1

## 2022-08-23 MED ORDER — OXYCODONE HCL 5 MG/5ML PO SOLN: 5.0000 mg | Freq: Once | ORAL | Status: AC | PRN

## 2022-08-23 MED ORDER — LACTATED RINGERS IV SOLN: INTRAVENOUS | Status: DC

## 2022-08-23 MED ORDER — LIDOCAINE 2% (20 MG/ML) 5 ML SYRINGE
INTRAMUSCULAR | Status: DC | PRN
  Administered 2022-08-23: 60 mg via INTRAVENOUS

## 2022-08-23 MED ORDER — BUPIVACAINE-EPINEPHRINE 0.25% -1:200000 IJ SOLN
INTRAMUSCULAR | Status: DC | PRN
  Administered 2022-08-23: 6 mL

## 2022-08-23 MED ORDER — CHLORHEXIDINE GLUCONATE CLOTH 2 % EX PADS: 6.0000 | MEDICATED_PAD | Freq: Once | CUTANEOUS | Status: DC

## 2022-08-23 MED ORDER — OXYCODONE HCL 5 MG PO TABS
ORAL_TABLET | ORAL | Status: AC
  Filled 2022-08-23: qty 1

## 2022-08-23 MED ORDER — ONDANSETRON HCL 4 MG/2ML IJ SOLN
INTRAMUSCULAR | Status: DC | PRN
  Administered 2022-08-23: 4 mg via INTRAVENOUS

## 2022-08-23 MED ORDER — LIDOCAINE HCL (PF) 2 % IJ SOLN
INTRAMUSCULAR | Status: AC
  Filled 2022-08-23: qty 5

## 2022-08-23 MED ORDER — FENTANYL CITRATE (PF) 100 MCG/2ML IJ SOLN
INTRAMUSCULAR | Status: AC
  Filled 2022-08-23: qty 2

## 2022-08-23 MED ORDER — ACETAMINOPHEN 500 MG PO TABS
1000.0000 mg | ORAL_TABLET | Freq: Once | ORAL | Status: DC
  Filled 2022-08-23: qty 2

## 2022-08-23 MED ORDER — ONDANSETRON HCL 4 MG/2ML IJ SOLN
INTRAMUSCULAR | Status: AC
  Filled 2022-08-23: qty 2

## 2022-08-23 MED ORDER — CHLORHEXIDINE GLUCONATE 0.12 % MT SOLN
15.0000 mL | Freq: Once | OROMUCOSAL | Status: AC
  Administered 2022-08-23: 15 mL via OROMUCOSAL

## 2022-08-23 MED ORDER — DEXAMETHASONE SODIUM PHOSPHATE 4 MG/ML IJ SOLN
INTRAMUSCULAR | Status: DC | PRN
  Administered 2022-08-23: 5 mg via INTRAVENOUS

## 2022-08-23 MED ORDER — OXYCODONE HCL 5 MG PO TABS: 5.0000 mg | ORAL_TABLET | Freq: Four times a day (QID) | ORAL | 0 refills | Status: DC | PRN

## 2022-08-23 MED ORDER — FENTANYL CITRATE PF 50 MCG/ML IJ SOSY
PREFILLED_SYRINGE | INTRAMUSCULAR | Status: AC
  Filled 2022-08-23: qty 1

## 2022-08-23 MED ORDER — LIDOCAINE HCL 1 % IJ SOLN
INTRAMUSCULAR | Status: AC
  Filled 2022-08-23: qty 20

## 2022-08-23 MED ORDER — PROPOFOL 10 MG/ML IV BOLUS
INTRAVENOUS | Status: DC | PRN
  Administered 2022-08-23: 120 mg via INTRAVENOUS

## 2022-08-23 MED ORDER — ORAL CARE MOUTH RINSE: 15.0000 mL | Freq: Once | OROMUCOSAL | Status: AC

## 2022-08-23 MED ORDER — LIDOCAINE HCL (PF) 1 % IJ SOLN
INTRAMUSCULAR | Status: DC | PRN
  Administered 2022-08-23: 6 mL

## 2022-08-23 SURGICAL SUPPLY — 44 items
ADH SKN CLS APL DERMABOND .7 (GAUZE/BANDAGES/DRESSINGS) ×1
APL SKNCLS STERI-STRIP NONHPOA (GAUZE/BANDAGES/DRESSINGS) ×1
BAG COUNTER SPONGE SURGICOUNT (BAG) IMPLANT
BAG SPNG CNTER NS LX DISP (BAG)
BENZOIN TINCTURE PRP APPL 2/3 (GAUZE/BANDAGES/DRESSINGS) ×1 IMPLANT
BLADE HEX COATED 2.75 (ELECTRODE) ×1 IMPLANT
BLADE SURG 15 STRL LF DISP TIS (BLADE) ×1 IMPLANT
BLADE SURG 15 STRL SS (BLADE) ×1
COVER SURGICAL LIGHT HANDLE (MISCELLANEOUS) ×1 IMPLANT
DEPRESSOR TONGUE 6 IN STERILE (GAUZE/BANDAGES/DRESSINGS) IMPLANT
DERMABOND ADVANCED .7 DNX12 (GAUZE/BANDAGES/DRESSINGS) IMPLANT
DRAIN PENROSE 0.5X18 (DRAIN) ×1 IMPLANT
DRAPE LAPAROSCOPIC ABDOMINAL (DRAPES) ×1 IMPLANT
ELECT REM PT RETURN 15FT ADLT (MISCELLANEOUS) ×1 IMPLANT
GAUZE SPONGE 4X4 12PLY STRL (GAUZE/BANDAGES/DRESSINGS) ×1 IMPLANT
GLOVE BIO SURGEON STRL SZ 6 (GLOVE) ×1 IMPLANT
GLOVE BIO SURGEON STRL SZ7 (GLOVE) ×2 IMPLANT
GLOVE BIOGEL PI IND STRL 6.5 (GLOVE) ×1 IMPLANT
GLOVE BIOGEL PI IND STRL 7.0 (GLOVE) ×1 IMPLANT
GLOVE INDICATOR 6.5 STRL GRN (GLOVE) ×1 IMPLANT
GOWN SPEC L3 XXLG W/TWL (GOWN DISPOSABLE) IMPLANT
GOWN STRL REUS W/ TWL XL LVL3 (GOWN DISPOSABLE) ×4 IMPLANT
GOWN STRL REUS W/TWL XL LVL3 (GOWN DISPOSABLE) ×4
KIT BASIN OR (CUSTOM PROCEDURE TRAY) ×1 IMPLANT
KIT TURNOVER KIT A (KITS) IMPLANT
NDL HYPO 25X1 1.5 SAFETY (NEEDLE) ×1 IMPLANT
NEEDLE HYPO 25X1 1.5 SAFETY (NEEDLE) ×1 IMPLANT
NS IRRIG 1000ML POUR BTL (IV SOLUTION) ×1 IMPLANT
PACK BASIC VI WITH GOWN DISP (CUSTOM PROCEDURE TRAY) ×1 IMPLANT
PENCIL SMOKE EVACUATOR (MISCELLANEOUS) IMPLANT
SPIKE FLUID TRANSFER (MISCELLANEOUS) ×1 IMPLANT
STRIP CLOSURE SKIN 1/2X4 (GAUZE/BANDAGES/DRESSINGS) ×1 IMPLANT
SUT MNCRL AB 4-0 PS2 18 (SUTURE) ×1 IMPLANT
SUT SILK 2 0 (SUTURE) ×1
SUT SILK 2-0 18XBRD TIE 12 (SUTURE) IMPLANT
SUT VIC AB 2-0 SH 27 (SUTURE) ×2
SUT VIC AB 2-0 SH 27X BRD (SUTURE) ×2 IMPLANT
SUT VIC AB 3-0 PS2 18 (SUTURE) ×1
SUT VIC AB 3-0 PS2 18XBRD (SUTURE) IMPLANT
SUT VIC AB 3-0 SH 27 (SUTURE) ×2
SUT VIC AB 3-0 SH 27XBRD (SUTURE) ×2 IMPLANT
SYR BULB IRRIG 60ML STRL (SYRINGE) ×1 IMPLANT
SYR CONTROL 10ML LL (SYRINGE) ×1 IMPLANT
TOWEL OR 17X26 10 PK STRL BLUE (TOWEL DISPOSABLE) ×1 IMPLANT

## 2022-08-23 NOTE — Transfer of Care (Signed)
Immediate Anesthesia Transfer of Care Note  Patient: Briana Schroeder  Procedure(s) Performed: RIGHT DELTOID MUSCLE BIOPSY (Right)  Patient Location: PACU  Anesthesia Type:General  Level of Consciousness: awake and patient cooperative  Airway & Oxygen Therapy: Patient Spontanous Breathing and Patient connected to face mask  Post-op Assessment: Report given to RN and Post -op Vital signs reviewed and stable  Post vital signs: Reviewed and stable  Last Vitals:  Vitals Value Taken Time  BP 142/85 08/23/22 0841  Temp 36.3 C 08/23/22 0840  Pulse 68 08/23/22 0844  Resp 15 08/23/22 0844  SpO2 99 % 08/23/22 0844  Vitals shown include unvalidated device data.  Last Pain:  Vitals:   08/23/22 0552  TempSrc: Oral         Complications: No notable events documented.

## 2022-08-23 NOTE — Discharge Instructions (Addendum)
[place] Office Phone Number [phone]   POST OP INSTRUCTIONS  Always review your discharge instruction sheet given to you by the facility where your surgery was performed.  IF YOU HAVE DISABILITY OR FAMILY LEAVE FORMS, YOU MUST BRING THEM TO THE OFFICE FOR PROCESSING.  DO NOT GIVE THEM TO YOUR DOCTOR.  Take 2 tylenol (acetominophen) three times a day for 3 days.  If you still have pain, add ibuprofen with food in between if able to take this (if you have kidney issues or stomach issues, do not take ibuprofen).  If both of those are not enough, add the narcotic pain pill.  If you find you are needing a lot of this overnight after surgery, call the next morning for a refill.   Take your usually prescribed medications unless otherwise directed If you need a refill on your pain medication, please contact your pharmacy.  They will contact our office to request authorization.  Prescriptions will not be filled after 5pm or on week-ends. You should eat very light the first 24 hours after surgery, such as soup, crackers, pudding, etc.  Resume your normal diet the day after surgery It is common to experience some constipation if taking pain medication after surgery.  Increasing fluid intake and taking a stool softener will usually help or prevent this problem from occurring.  A mild laxative (Milk of Magnesia or Miralax) should be taken according to package directions if there are no bowel movements after 48 hours. You may shower in 48 hours.  The surgical glue will flake off in 2-3 weeks.   ACTIVITIES:  No strenuous activity or heavy lifting for 1 week.   You may drive when you no longer are taking prescription pain medication, you can comfortably wear a seatbelt, and you can safely maneuver your car and apply brakes. RETURN TO WORK:  __________n/a_______________ Briana Schroeder should see your doctor in the office for a follow-up appointment approximately three-four weeks after your surgery.     WHEN TO CALL YOUR DOCTOR: Fever over 101.0 Nausea and/or vomiting. Extreme swelling or bruising. Continued bleeding from incision. Increased pain, redness, or drainage from the incision.  The clinic staff is available to answer your questions during regular business hours.  Please don't hesitate to call and ask to speak to one of the nurses for clinical concerns.  If you have a medical emergency, go to the nearest emergency room or call 911.  A surgeon from [facility] is always on call at the hospital.  For further questions, please visit [email or web address]

## 2022-08-23 NOTE — Interval H&P Note (Signed)
History and Physical Interval Note:  08/23/2022 7:33 AM  Briana Schroeder  has presented today for surgery, with the diagnosis of RIGHT UPPER ARM PAIN AND WEAKNESS.  The various methods of treatment have been discussed with the patient and family. After consideration of risks, benefits and other options for treatment, the patient has consented to  Procedure(s): RIGHT DELTOID MUSCLE BIOPSY (Right) as a surgical intervention.  The patient's history has been reviewed, patient examined, no change in status, stable for surgery.  I have reviewed the patient's chart and labs.  Questions were answered to the patient's satisfaction.     Stark Klein

## 2022-08-23 NOTE — Op Note (Signed)
PRE-OPERATIVE DIAGNOSIS: weakness and muscle atrophy  POST-OPERATIVE DIAGNOSIS:  Same  PROCEDURE:  Procedure(s): Right deltoid muscle biopsy  SURGEON:  Surgeon(s): Stark Klein, MD  ANESTHESIA:   local and general  DRAINS: none   LOCAL MEDICATIONS USED:  MARCAINE    and LIDOCAINE   SPECIMEN:  Source of Specimen:  right deltoid  DISPOSITION OF SPECIMEN:  PATHOLOGY  COUNTS:  YES  DICTATION: .Dragon Dictation  PLAN OF CARE: Discharge to home after PACU  PATIENT DISPOSITION:  PACU - hemodynamically stable.  EBL minimal  Findings:  Muscle appears grossly normal  PROCEDURE:   Patient was identified in the holding area and taken the operating room where she was placed supine on the operating room table. General anesthesia was induced the LMA anesthesia. The right deltoid was prepped and draped in sterile fashion. Timeout was performed according to the surgical safety checklist. When all was correct, we continued. A vertical incision was made approximately 4 cm in length on the lateral deltoid. Extensive local anesthetic was administered. The subcutaneous tissues were divided with the cautery. A Wietlaner retractor was used for visualization.   The deltoid fascia was opened sharply with the Metzenbaum scissors. A 3 cm piece of muscle was dissected out with a tonsil. This was clamped on both ends with hemostats. Metzenbaum scissors were used to resect a piece of muscle. This was tied down to the tongue depressor to keep it in stretch. The 2 ends of the muscle were tied off with 2-0 silk ties. The wound was irrigated.   The muscle fascia was closed with 3-0 running Vicryl suture. The skin was reapproximated with 3-0 Vicryl interrupted deep dermal suture and 4-0 Monocryl running subcuticular suture. The incision was then cleaned, dried, and dressed with Dermabond and Steri-Strips. Patient was awakened from anesthesia and taken to the PACU in stable condition. Needle, sponge, instrument  counts were correct x2.

## 2022-08-23 NOTE — Anesthesia Postprocedure Evaluation (Signed)
Anesthesia Post Note  Patient: Briana Schroeder  Procedure(s) Performed: RIGHT DELTOID MUSCLE BIOPSY (Right)     Patient location during evaluation: PACU Anesthesia Type: General Level of consciousness: awake and alert Pain management: pain level controlled Vital Signs Assessment: post-procedure vital signs reviewed and stable Respiratory status: spontaneous breathing, nonlabored ventilation and respiratory function stable Cardiovascular status: blood pressure returned to baseline Postop Assessment: no apparent nausea or vomiting Anesthetic complications: no   No notable events documented.  Last Vitals:  Vitals:   08/23/22 0845 08/23/22 0900  BP: (!) 146/75 (!) 144/77  Pulse: 68 69  Resp: 15 12  Temp:    SpO2: 99% 96%    Last Pain:  Vitals:   08/23/22 0900  TempSrc:   PainSc: Asleep                 Marthenia Rolling

## 2022-08-23 NOTE — Anesthesia Procedure Notes (Signed)
Procedure Name: LMA Insertion Date/Time: 08/23/2022 8:40 AM  Performed by: Claudia Desanctis, CRNAPre-anesthesia Checklist: Emergency Drugs available, Patient identified, Suction available and Patient being monitored Patient Re-evaluated:Patient Re-evaluated prior to induction Oxygen Delivery Method: Circle system utilized Preoxygenation: Pre-oxygenation with 100% oxygen Induction Type: IV induction Ventilation: Mask ventilation without difficulty LMA: LMA inserted LMA Size: 4.0 Number of attempts: 1 Placement Confirmation: positive ETCO2 and breath sounds checked- equal and bilateral Tube secured with: Tape Dental Injury: Teeth and Oropharynx as per pre-operative assessment

## 2022-08-23 NOTE — H&P (Signed)
REFERRING PHYSICIAN: Lahoma Rocker PROVIDER: Georgianne Fick, MD MRN: [id number] DOB: 02/24/1946 DATE OF ENCOUNTER: 07/30/2022 Subjective   Chief Complaint: New Consultation ( Eval of muscle bx, rt deltoid,)  History of Present Illness: Briana Schroeder is a 77 y.o. female who is seen today as an office consultation for evaluation of New Consultation ( Eval of muscle bx, rt deltoid,)  Patient presents January 2024 after referral from Dr. Kathlene November for bilateral upper extremity pain and weakness. He is a rheumatologist and found her to have elevated CK and sed rate. The pain started on approximately June to July of 2023. It has been worsening and she has lost some weight. Her primary care physician tried a steroid taper which helped a little but did not make things better. She is up-to-date with screening recommendations for breast cancer and colon cancer.  A muscle biopsy is requested on the right deltoid and she is here to discuss.  Review of Systems: A complete review of systems was obtained from the patient. I have reviewed this information and discussed as appropriate with the patient. See HPI as well for other ROS.  Review of Systems  All other systems reviewed and are negative.   Medical History: Past Medical History:  Diagnosis Date  Arthritis  Hypertension   Patient Active Problem List  Diagnosis  Upper extremity weakness  Pain in both upper extremities   History reviewed. No pertinent surgical history.   No Known Allergies  Current Outpatient Medications on File Prior to Visit  Medication Sig Dispense Refill  amLODIPine (NORVASC) 10 MG tablet Take 10 mg by mouth once daily  cetirizine (ZYRTEC) 10 MG tablet Take 10 mg by mouth once daily  pantoprazole (PROTONIX) 40 MG DR tablet Take 1 tablet by mouth once daily   No current facility-administered medications on file prior to visit.   Family History  Problem Relation Age of Onset  Breast cancer Mother     Social History   Tobacco Use  Smoking Status Never  Smokeless Tobacco Never    Social History   Socioeconomic History  Marital status: Married  Tobacco Use  Smoking status: Never  Smokeless tobacco: Never  Substance and Sexual Activity  Alcohol use: Not Currently  Drug use: Never   Objective:   Vitals:  07/30/22 0916  BP: 120/76  Pulse: 94  Temp: 36.7 C (98 F)  SpO2: 99%  Weight: 54.9 kg (121 lb)  Height: 160 cm (5' 3"$ )   Body mass index is 21.43 kg/m.  Head: Normocephalic and atraumatic.  Mouth/Throat: Oropharynx is clear and moist. No oropharyngeal exudate.  Eyes: Conjunctivae are normal. Pupils are equal, round, and reactive to light. No scleral icterus.  Neck: Normal range of motion. Neck supple. No tracheal deviation present. No thyromegaly present.  Cardiovascular: Normal rate, regular rhythm, normal heart sounds and intact distal pulses. Exam reveals no gallop and no friction rub.  No murmur heard. Respiratory: Effort normal and breath sounds normal. No respiratory distress. No wheezes, rales or rhonchi. No chest wall tenderness.  GI: Soft. Bowel sounds are normal. Abdomen is soft, non tender, non distended. No masses or hepatosplenomegaly is present. There is no rebound and no guarding.  Musculoskeletal: . Hands have changes c/w arthritis. Bilateral deltoids are tender.  Lymphadenopathy: No cervical or axillary adenopathy.  Neurological: Alert and oriented to person, place, and time. Coordination normal.  Skin: Skin is warm and dry. No rash noted. No diaphoresis. No erythema. No pallor.  Psychiatric: Normal mood and affect.Behavior  is normal. Judgment and thought content normal.   Labs, Imaging and Diagnostic Testing: From 04/26/2022 CK 780 ESR 116  Assessment and Plan:   Diagnoses and all orders for this visit:  Pain in both upper extremities  Upper extremity weakness   In the patient's progressive muscle weakness and pain in conjunction  with her elevated inflammatory markers, we will plan to do a muscle biopsy. The right deltoid is requested based on it being the most painful and most weak feeling region. I discussed the procedure with the patient. I reviewed that she would need to be asleep as it is extraordinarily difficult to numb the muscle fibers enough to make it tolerable. Reviewed that I would put a longitudinal incision over the deltoid approximately 3 to 4 cm in length. I discussed that I would go down to the muscle and take a small portion of the muscle out. I reviewed that this would have to be sent out to a special pathology lab and could take a while to come back.  I also discussed risks of heart and lung issues as well as blood clot. I reviewed that she would need someone with her day of surgery.   Georgianne Fick, MD

## 2022-08-24 ENCOUNTER — Encounter (HOSPITAL_COMMUNITY): Payer: Self-pay | Admitting: General Surgery

## 2022-08-28 ENCOUNTER — Ambulatory Visit (INDEPENDENT_AMBULATORY_CARE_PROVIDER_SITE_OTHER): Payer: Medicare Other | Admitting: Internal Medicine

## 2022-08-28 VITALS — BP 136/80 | HR 81 | Temp 98.1°F | Ht 63.0 in | Wt 121.0 lb

## 2022-08-28 DIAGNOSIS — I10 Essential (primary) hypertension: Secondary | ICD-10-CM | POA: Diagnosis not present

## 2022-08-28 DIAGNOSIS — M353 Polymyalgia rheumatica: Secondary | ICD-10-CM | POA: Diagnosis not present

## 2022-08-28 DIAGNOSIS — R739 Hyperglycemia, unspecified: Secondary | ICD-10-CM

## 2022-08-28 DIAGNOSIS — R634 Abnormal weight loss: Secondary | ICD-10-CM | POA: Diagnosis not present

## 2022-08-28 DIAGNOSIS — E7800 Pure hypercholesterolemia, unspecified: Secondary | ICD-10-CM | POA: Diagnosis not present

## 2022-08-28 NOTE — Progress Notes (Signed)
Patient ID: Briana Schroeder, female   DOB: March 31, 1946, 77 y.o.   MRN: [id number]        Chief Complaint: follow up myalgias, wt loss, htn, hyerglycemia, hld       HPI:  Briana Schroeder is a 77 y.o. female herer overall doing ok, Pt denies chest pain, increased sob or doe, wheezing, orthopnea, PND, increased LE swelling, palpitations, dizziness or syncope.   Pt denies polydipsia, polyuria, or new focal neuro s/s.   Pt denies fever, wt loss, night sweats, loss of appetite, or other constitutional symptoms  BP has been increased recently at home as well despite wt loss for unclear reason and lower appetite.  Has also had several myalgias all over, mild persistent for several weeks.  Pt has been seeing rheum, now just recently s/p right deltoid biopsy 12/22 and results apparently still pending, pt plans to call to see about the results.  Prednisone has resolved the myalgias for now.           Wt Readings from Last 3 Encounters:  08/28/22 121 lb (54.9 kg)  08/23/22 133 lb (60.3 kg)  08/10/22 133 lb (60.3 kg)   BP Readings from Last 3 Encounters:  08/28/22 136/80  08/23/22 (!) 153/90  08/10/22 (!) 133/97         Past Medical History:  Diagnosis Date   ALLERGIC RHINITIS 08/02/2009   Allergy    ANXIETY 10/18/2008   ARM PAIN, RIGHT 04/04/2009   Arthritis    BACK PAIN 08/02/2009   CHEST PAIN 12/05/2009   COLONIC POLYPS, HX OF 10/18/2008   DIZZINESS 12/05/2009   EPIGASTRIC PAIN 01/09/2010   FATIGUE 09/06/2009   GERD 12/06/2009   HOARSENESS 09/06/2009   HYPERLIPIDEMIA 10/18/2008   HYPERTENSION 10/18/2008   IBS 10/18/2008   MYALGIA 03/21/2009   Osteopenia 12/2018   T score -1.7 FRAX 12% / 2.5% table from prior DEXA 2018   PULMONARY SARCOIDOSIS 10/18/2008   SINUSITIS- ACUTE-NOS 03/21/2009   VOCAL CORD NODULE 09/06/2009   Past Surgical History:  Procedure Laterality Date   ABDOMINAL HYSTERECTOMY  08/1992   TAH-DR G FOR FIBROIDS AND MENORRHAGIA   COLONOSCOPY  11-24-2009   3 polyps,  +TA and HPP   hx of vocal cord nodules     MUSCLE BIOPSY Right 08/23/2022   Procedure: RIGHT DELTOID MUSCLE BIOPSY;  Surgeon: Stark Klein, MD;  Location: [facility];  Service: General;  Laterality: Right;   s/p right finger tendon surgury     UPPER GASTROINTESTINAL ENDOSCOPY  01-08-2010   normal    reports that she has never smoked. She has never used smokeless tobacco. She reports that she does not drink alcohol and does not use drugs. family history includes Breast cancer (age of onset: 35) in her mother; Diabetes in her mother; Hypertension in her mother and sister. No Known Allergies Current Outpatient Medications on File Prior to Visit  Medication Sig Dispense Refill   amLODipine (NORVASC) 10 MG tablet Take 1 tablet by mouth once daily 90 tablet 0   ascorbic acid (VITAMIN C) 500 MG tablet Take 500 mg by mouth daily.     cetirizine (ZYRTEC) 10 MG tablet Take 1 tablet by mouth once daily 30 tablet 0   cyclobenzaprine (FLEXERIL) 5 MG tablet Take 1 tablet (5 mg total) by mouth at bedtime as needed for muscle spasms. 90 tablet 3   meloxicam (MOBIC) 15 MG tablet TAKE 1 TABLET BY MOUTH ONCE DAILY AS NEEDED 90 tablet 3   Methylcobalamin (  B-12) 5000 MCG TBDP Take 5,000 mcg by mouth daily.     oxyCODONE (OXY IR/ROXICODONE) 5 MG immediate release tablet Take 1 tablet (5 mg total) by mouth every 6 (six) hours as needed for severe pain. 5 tablet 0   pantoprazole (PROTONIX) 40 MG tablet Take 1 tablet by mouth once daily 90 tablet 0   predniSONE (DELTASONE) 5 MG tablet take 4 tabs once a day Orally Once a day for 37 days     traMADol (ULTRAM) 50 MG tablet TAKE 1 TABLET BY MOUTH EVERY 6 HOURS AS NEEDED 30 tablet 5   VITAMIN D, CHOLECALCIFEROL, PO Take 1 tablet by mouth daily.     No current facility-administered medications on file prior to visit.        ROS:  All others reviewed and negative.  Objective        PE:  BP 136/80 (BP Location: Right Arm, Patient Position: Sitting, Cuff Size: Normal)    Pulse 81   Temp 98.1 F (36.7 C) (Oral)   Ht '5\' 3"'$  (1.6 m)   Wt 121 lb (54.9 kg)   SpO2 96%   BMI 21.43 kg/m                 Constitutional: Pt appears in NAD               HENT: Head: NCAT.                Right Ear: External ear normal.                 Left Ear: External ear normal.                Eyes: . Pupils are equal, round, and reactive to light. Conjunctivae and EOM are normal               Nose: without d/c or deformity               Neck: Neck supple. Gross normal ROM               Cardiovascular: Normal rate and regular rhythm.                 Pulmonary/Chest: Effort normal and breath sounds without rales or wheezing.                Abd:  Soft, NT, ND, + BS, no organomegaly               Neurological: Pt is alert. At baseline orientation, motor grossly intact               Skin: Skin is warm. No rashes, no other new lesions, LE edema - none               Psychiatric: Pt behavior is normal without agitation   Micro: none  Cardiac tracings I have personally interpreted today:  none  Pertinent Radiological findings (summarize): none   Lab Results  Component Value Date   WBC 6.7 08/10/2022   HGB 13.0 08/10/2022   HCT 41.2 08/10/2022   PLT 256 08/10/2022   GLUCOSE 115 (H) 08/10/2022   CHOL 176 10/11/2021   TRIG 84.0 10/11/2021   HDL 80.30 10/11/2021   LDLDIRECT 83.3 11/17/2012   LDLCALC 79 10/11/2021   ALT 20 04/26/2022   AST 31 04/26/2022   NA 138 08/10/2022   K 3.9 08/10/2022   CL 104 08/10/2022   CREATININE 0.78 08/10/2022  BUN 20 08/10/2022   CO2 27 08/10/2022   TSH 0.68 01/26/2022   HGBA1C 6.6 (H) 10/11/2021   Assessment/Plan:  Briana Schroeder is a 77 y.o. Black or African American [2] female with  has a past medical history of ALLERGIC RHINITIS (08/02/2009), Allergy, ANXIETY (10/18/2008), ARM PAIN, RIGHT (04/04/2009), Arthritis, BACK PAIN (08/02/2009), CHEST PAIN (12/05/2009), COLONIC POLYPS, HX OF (10/18/2008), DIZZINESS (12/05/2009), EPIGASTRIC  PAIN (01/09/2010), FATIGUE (09/06/2009), GERD (12/06/2009), HOARSENESS (09/06/2009), HYPERLIPIDEMIA (10/18/2008), HYPERTENSION (10/18/2008), IBS (10/18/2008), MYALGIA (03/21/2009), Osteopenia (12/2018), PULMONARY SARCOIDOSIS (10/18/2008), SINUSITIS- ACUTE-NOS (03/21/2009), and VOCAL CORD NODULE (09/06/2009).  Essential hypertension BP Readings from Last 3 Encounters:  08/28/22 136/80  08/23/22 (!) 153/90  08/10/22 (!) 133/97   Uncontrolled, to add losartan 50 mg qd, , pt to continue medical treatment norvasc 10 mg qd   Hyperglycemia Lab Results  Component Value Date   HGBA1C 6.6 (H) 10/11/2021   Mild uncontrolled, pt to continue current medical treatment  - diet, wt control   Hyperlipidemia Lab Results  Component Value Date   [place] 79 10/11/2021   Uncontrolled, goal ldl < 70,, pt declines statin or other due to myalgias for now, for lower chol diet   Polymyalgia ([place]) I suspect underlying rheumatologic myositis, pt to f/u with rheum regarding diagnosis, continue prednisone  Weight loss With low appetite I suspect related to geriatric decline vs undelrying rheum disorder  Followup: Return in about 6 months (around 02/26/2023).  Cathlean Cower, MD 09/01/2022 1:49 PM [facility]

## 2022-08-28 NOTE — Patient Instructions (Signed)
Please take all new medication as prescribed - the losartan 50 mg per day  Please continue all other medications as before, and refills have been done if requested.  Please have the pharmacy call with any other refills you may need.  Please keep your appointments with your specialists as you may have planned - rheumatology  Please make an Appointment to return in 6 months, or sooner if needed

## 2022-08-29 ENCOUNTER — Ambulatory Visit: Payer: Medicare Other | Admitting: Rheumatology

## 2022-08-30 ENCOUNTER — Telehealth: Payer: Self-pay

## 2022-08-30 NOTE — Telephone Encounter (Signed)
Called patient to schedule Medicare Annual Wellness Visit (AWV). Left message for patient to call back and schedule Medicare Annual Wellness Visit (AWV).  Last date of AWV: 11/17/19  Please schedule an appointment at any time with [facility] or [facility].   Norton Blizzard, Kell (AAMA)  [facility] [phone]

## 2022-09-01 ENCOUNTER — Encounter: Payer: Self-pay | Admitting: Internal Medicine

## 2022-09-01 DIAGNOSIS — M353 Polymyalgia rheumatica: Secondary | ICD-10-CM | POA: Insufficient documentation

## 2022-09-01 MED ORDER — LOSARTAN POTASSIUM 50 MG PO TABS: 50.0000 mg | ORAL_TABLET | Freq: Every day | ORAL | 3 refills | Status: DC

## 2022-09-01 NOTE — Assessment & Plan Note (Signed)
BP Readings from Last 3 Encounters:  08/28/22 136/80  08/23/22 (!) 153/90  08/10/22 (!) 133/97   Uncontrolled, to add losartan 50 mg qd, , pt to continue medical treatment norvasc 10 mg qd

## 2022-09-01 NOTE — Assessment & Plan Note (Signed)
Lab Results  Component Value Date   LDLCALC 79 10/11/2021   Uncontrolled, goal ldl < 70,, pt declines statin or other due to myalgias for now, for lower chol diet

## 2022-09-01 NOTE — Assessment & Plan Note (Signed)
With low appetite I suspect related to geriatric decline vs undelrying rheum disorder

## 2022-09-01 NOTE — Assessment & Plan Note (Signed)
I suspect underlying rheumatologic myositis, pt to f/u with rheum regarding diagnosis, continue prednisone

## 2022-09-01 NOTE — Assessment & Plan Note (Signed)
Lab Results  Component Value Date   HGBA1C 6.6 (H) 10/11/2021   Mild uncontrolled, pt to continue current medical treatment  - diet, wt control

## 2022-09-06 ENCOUNTER — Encounter (HOSPITAL_COMMUNITY): Payer: Self-pay

## 2022-09-07 LAB — SURGICAL PATHOLOGY

## 2022-09-09 ENCOUNTER — Other Ambulatory Visit: Payer: Self-pay | Admitting: Internal Medicine

## 2022-09-14 DIAGNOSIS — R29898 Other symptoms and signs involving the musculoskeletal system: Secondary | ICD-10-CM | POA: Diagnosis not present

## 2022-09-19 ENCOUNTER — Ambulatory Visit: Payer: Medicare Other | Admitting: Rheumatology

## 2022-09-27 ENCOUNTER — Ambulatory Visit: Payer: Medicare Other | Admitting: Rheumatology

## 2022-10-01 ENCOUNTER — Other Ambulatory Visit: Payer: Self-pay | Admitting: Internal Medicine

## 2022-10-05 DIAGNOSIS — G5731 Lesion of lateral popliteal nerve, right lower limb: Secondary | ICD-10-CM | POA: Diagnosis not present

## 2022-10-05 DIAGNOSIS — Z79899 Other long term (current) drug therapy: Secondary | ICD-10-CM | POA: Diagnosis not present

## 2022-10-05 DIAGNOSIS — R768 Other specified abnormal immunological findings in serum: Secondary | ICD-10-CM | POA: Diagnosis not present

## 2022-10-05 DIAGNOSIS — R748 Abnormal levels of other serum enzymes: Secondary | ICD-10-CM | POA: Diagnosis not present

## 2022-10-05 DIAGNOSIS — M353 Polymyalgia rheumatica: Secondary | ICD-10-CM | POA: Diagnosis not present

## 2022-10-05 DIAGNOSIS — M1990 Unspecified osteoarthritis, unspecified site: Secondary | ICD-10-CM | POA: Diagnosis not present

## 2022-10-07 ENCOUNTER — Other Ambulatory Visit: Payer: Self-pay | Admitting: Internal Medicine

## 2022-10-12 ENCOUNTER — Encounter: Payer: Self-pay | Admitting: Neurology

## 2022-10-12 ENCOUNTER — Other Ambulatory Visit: Payer: Self-pay | Admitting: Internal Medicine

## 2022-10-26 ENCOUNTER — Ambulatory Visit (INDEPENDENT_AMBULATORY_CARE_PROVIDER_SITE_OTHER): Payer: Medicare Other

## 2022-10-26 ENCOUNTER — Encounter: Payer: Self-pay | Admitting: Family Medicine

## 2022-10-26 ENCOUNTER — Ambulatory Visit (INDEPENDENT_AMBULATORY_CARE_PROVIDER_SITE_OTHER): Payer: Medicare Other | Admitting: Family Medicine

## 2022-10-26 VITALS — BP 128/82 | HR 94 | Temp 97.6°F | Ht 63.0 in | Wt 129.0 lb

## 2022-10-26 DIAGNOSIS — W19XXXA Unspecified fall, initial encounter: Secondary | ICD-10-CM

## 2022-10-26 DIAGNOSIS — M8580 Other specified disorders of bone density and structure, unspecified site: Secondary | ICD-10-CM | POA: Diagnosis not present

## 2022-10-26 DIAGNOSIS — S8991XA Unspecified injury of right lower leg, initial encounter: Secondary | ICD-10-CM

## 2022-10-26 DIAGNOSIS — M25561 Pain in right knee: Secondary | ICD-10-CM | POA: Diagnosis not present

## 2022-10-26 NOTE — Progress Notes (Unsigned)
Subjective:     Patient ID: Briana Schroeder, female    DOB: [birth year], 77 y.o.   MRN: [id number]  Chief Complaint  Patient presents with   Knee Injury    Larey Seat about a week ago at her house coming down her stairs and hurt her right knee. Noticed a little knot on the knee, hurts when she walks. Taking tylenol     HPI Patient is in today for right knee pain post fall. States she was walking down her stairs at home last week and missed a step. She did not think she injured herself but this week she started having right knee pain. No LOC. Denies hitting her head. No neck or back pain.   She has meloxicam and Tramadol at home but has not needed them for knee pain.   Denies locking, popping or giving away of her knee. She is limping and has swelling.   Health Maintenance Due  Topic Date Due   DTaP/Tdap/Td (2 - Tdap) 10/19/2018   Medicare Annual Wellness (AWV)  11/16/2020    Past Medical History:  Diagnosis Date   ALLERGIC RHINITIS 08/02/2009   Allergy    ANXIETY 10/18/2008   ARM PAIN, RIGHT 04/04/2009   Arthritis    BACK PAIN 08/02/2009   CHEST PAIN 12/05/2009   COLONIC POLYPS, HX OF 10/18/2008   DIZZINESS 12/05/2009   EPIGASTRIC PAIN 01/09/2010   FATIGUE 09/06/2009   GERD 12/06/2009   HOARSENESS 09/06/2009   HYPERLIPIDEMIA 10/18/2008   HYPERTENSION 10/18/2008   IBS 10/18/2008   MYALGIA 03/21/2009   Osteopenia 12/2018   T score -1.7 FRAX 12% / 2.5% table from prior DEXA 2018   PULMONARY SARCOIDOSIS 10/18/2008   SINUSITIS- ACUTE-NOS 03/21/2009   VOCAL CORD NODULE 09/06/2009    Past Surgical History:  Procedure Laterality Date   ABDOMINAL HYSTERECTOMY  08/1992   TAH-DR G FOR FIBROIDS AND MENORRHAGIA   COLONOSCOPY  11-24-2009   3 polyps, +TA and HPP   hx of vocal cord nodules     MUSCLE BIOPSY Right 08/23/2022   Procedure: RIGHT DELTOID MUSCLE BIOPSY;  Surgeon: Almond Lint, MD;  Location: [facility];  Service: General;  Laterality: Right;   s/p right finger tendon  surgury     UPPER GASTROINTESTINAL ENDOSCOPY  01-08-2010   normal    Family History  Problem Relation Age of Onset   Diabetes Mother    Hypertension Mother    Breast cancer Mother 54   Hypertension Sister    Colon cancer Neg Hx    Colon polyps Neg Hx    Rectal cancer Neg Hx    Stomach cancer Neg Hx    Esophageal cancer Neg Hx     Social History   Socioeconomic History   Marital status: Married    Spouse name: Not on file   Number of children: 2   Years of education: Not on file   Highest education level: Not on file  Occupational History   Occupation: Water engineer  Tobacco Use   Smoking status: Never   Smokeless tobacco: Never  Vaping Use   Vaping Use: Never used  Substance and Sexual Activity   Alcohol use: No    Alcohol/week: 0.0 standard drinks of alcohol   Drug use: No   Sexual activity: Not Currently    Birth control/protection: Surgical    Comment: 1st intercourse 62 yo-1 partner  Other Topics Concern   Not on file  Social History Narrative   Not on  file   Social Determinants of Health   Financial Resource Strain: Low Risk  (11/06/2017)   Overall Financial Resource Strain (CARDIA)    Difficulty of Paying Living Expenses: Not hard at all  Food Insecurity: No Food Insecurity (11/06/2017)   Hunger Vital Sign    Worried About Running Out of Food in the Last Year: Never true    Ran Out of Food in the Last Year: Never true  Transportation Needs: No Transportation Needs (11/06/2017)   PRAPARE - Administrator, Civil Service (Medical): No    Lack of Transportation (Non-Medical): No  Physical Activity: Sufficiently Active (11/06/2017)   Exercise Vital Sign    Days of Exercise per Week: 5 days    Minutes of Exercise per Session: 70 min  Stress: No Stress Concern Present (11/06/2017)   [employer] of Occupational Health - Occupational Stress Questionnaire    Feeling of Stress : Not at all  Social Connections: Socially Integrated  (11/06/2017)   Social Connection and Isolation Panel [NHANES]    Frequency of Communication with Friends and Family: More than three times a week    Frequency of Social Gatherings with Friends and Family: More than three times a week    Attends Religious Services: More than 4 times per year    Active Member of [employer] or Organizations: Yes    Attends Banker Meetings: 1 to 4 times per year    Marital Status: Married  Catering manager Violence: Not At Risk (11/06/2017)   Humiliation, Afraid, Rape, and Kick questionnaire    Fear of Current or Ex-Partner: No    Emotionally Abused: No    Physically Abused: No    Sexually Abused: No    Outpatient Medications Prior to Visit  Medication Sig Dispense Refill   amLODipine (NORVASC) 10 MG tablet Take 1 tablet by mouth once daily 90 tablet 1   ascorbic acid (VITAMIN C) 500 MG tablet Take 500 mg by mouth daily.     cetirizine (ZYRTEC) 10 MG tablet Take 1 tablet by mouth once daily 30 tablet 0   cyclobenzaprine (FLEXERIL) 5 MG tablet Take 1 tablet (5 mg total) by mouth at bedtime as needed for muscle spasms. 90 tablet 3   losartan (COZAAR) 50 MG tablet Take 1 tablet (50 mg total) by mouth daily. 90 tablet 3   meloxicam (MOBIC) 15 MG tablet TAKE 1 TABLET BY MOUTH ONCE DAILY AS NEEDED 90 tablet 3   Methylcobalamin (B-12) 5000 MCG TBDP Take 5,000 mcg by mouth daily.     oxyCODONE (OXY IR/ROXICODONE) 5 MG immediate release tablet Take 1 tablet (5 mg total) by mouth every 6 (six) hours as needed for severe pain. 5 tablet 0   pantoprazole (PROTONIX) 40 MG tablet Take 1 tablet by mouth once daily 90 tablet 0   traMADol (ULTRAM) 50 MG tablet TAKE 1 TABLET BY MOUTH EVERY 6 HOURS AS NEEDED 30 tablet 5   VITAMIN D, CHOLECALCIFEROL, PO Take 1 tablet by mouth daily.     predniSONE (DELTASONE) 5 MG tablet take 4 tabs once a day Orally Once a day for 37 days (Patient not taking: Reported on 10/26/2022)     No facility-administered medications prior to visit.     No Known Allergies  Review of Systems  Constitutional:  Negative for chills, fever, malaise/fatigue and weight loss.  Respiratory:  Negative for shortness of breath.   Cardiovascular:  Negative for chest pain and palpitations.  Gastrointestinal:  Negative for abdominal  pain, nausea and vomiting.  Musculoskeletal:  Positive for falls and joint pain. Negative for back pain and neck pain.  Neurological:  Negative for dizziness, focal weakness, loss of consciousness and headaches.       Objective:    Physical Exam Constitutional:      General: She is not in acute distress.    Appearance: She is not ill-appearing.  HENT:     Head: Atraumatic.  Eyes:     Extraocular Movements: Extraocular movements intact.     Conjunctiva/sclera: Conjunctivae normal.  Cardiovascular:     Rate and Rhythm: Normal rate.  Pulmonary:     Effort: Pulmonary effort is normal.  Musculoskeletal:     Cervical back: Normal range of motion and neck supple.     Right knee: Swelling present. No effusion. Normal range of motion. Tenderness present over the medial joint line. No patellar tendon tenderness.     Instability Tests: Medial McMurray test negative and lateral McMurray test negative.     Left knee: Normal.     Right lower leg: No edema.     Left lower leg: No edema.  Skin:    General: Skin is warm and dry.     Capillary Refill: Capillary refill takes less than 2 seconds.     Findings: No bruising.  Neurological:     General: No focal deficit present.     Mental Status: She is alert and oriented to person, place, and time.     Cranial Nerves: No cranial nerve deficit.     Motor: No weakness.     Coordination: Coordination normal.     Gait: Gait abnormal.  Psychiatric:        Mood and Affect: Mood normal.        Behavior: Behavior normal.        Thought Content: Thought content normal.     BP 128/82 (BP Location: Left Arm, Patient Position: Sitting, Cuff Size: Large)   Pulse 94   Temp  97.6 F (36.4 C) (Temporal)   Ht 5\' 3"  (1.6 m)   Wt 129 lb (58.5 kg)   SpO2 99%   BMI 22.85 kg/m  Wt Readings from Last 3 Encounters:  10/26/22 129 lb (58.5 kg)  08/28/22 121 lb (54.9 kg)  08/23/22 133 lb (60.3 kg)       Assessment & Plan:   Problem List Items Addressed This Visit       Musculoskeletal and Integument   Osteopenia   Other Visit Diagnoses     Injury of right knee, initial encounter    -  Primary   Relevant Orders   DG Knee Complete 4 Views Right (Completed)   Fall, initial encounter       Relevant Orders   DG Knee Complete 4 Views Right (Completed)      No sign of instability of knee. Mild edema.  X ray ordered. Discussed conservative management with rest, ice, elevation and compression. She has not needed oral pain medication but has some at home if needed. Recommend trying topical Voltaren. She has this at home also.  Ace wrap placed on knee today. Follow up pending X ray or if not improving.   I have discontinued Lalah A. Blumenstock's predniSONE. I am also having her maintain her (VITAMIN D, CHOLECALCIFEROL, PO), cyclobenzaprine, meloxicam, traMADol, ascorbic acid, B-12, oxyCODONE, losartan, amLODipine, cetirizine, and pantoprazole.  No orders of the defined types were placed in this encounter.

## 2022-10-26 NOTE — Patient Instructions (Signed)
Please go downstairs for an X ray of your knee.   Take the meloxicam you have at home if needed for pain.   Use ice and elevate your knee 2-3 times per day for at least 20 minutes.   Use an ace wrap if this helps with pain and swelling.   Follow up if you are getting worse or not improving in the next week.

## 2022-11-09 ENCOUNTER — Other Ambulatory Visit: Payer: Self-pay | Admitting: Internal Medicine

## 2022-11-12 ENCOUNTER — Other Ambulatory Visit: Payer: Self-pay

## 2022-11-13 ENCOUNTER — Telehealth: Payer: Self-pay

## 2022-11-13 ENCOUNTER — Other Ambulatory Visit (INDEPENDENT_AMBULATORY_CARE_PROVIDER_SITE_OTHER): Payer: Medicare Other

## 2022-11-13 ENCOUNTER — Ambulatory Visit (INDEPENDENT_AMBULATORY_CARE_PROVIDER_SITE_OTHER): Payer: Medicare Other | Admitting: Neurology

## 2022-11-13 ENCOUNTER — Encounter: Payer: Self-pay | Admitting: Neurology

## 2022-11-13 VITALS — BP 128/88 | HR 105 | Ht 63.0 in | Wt 131.0 lb

## 2022-11-13 DIAGNOSIS — R202 Paresthesia of skin: Secondary | ICD-10-CM

## 2022-11-13 LAB — B12 AND FOLATE PANEL
Folate: >23.9 ng/mL (ref >5.9)
Vitamin B-12: >1500 pg/mL — ABNORMAL HIGH (ref 211–911)

## 2022-11-13 MED ORDER — GABAPENTIN 300 MG PO CAPS: 300.0000 mg | ORAL_CAPSULE | Freq: Every day | ORAL | 5 refills | Status: DC

## 2022-11-13 NOTE — Progress Notes (Signed)
[facility] Clinic Note - Initial Visit   Date: 11/13/2022   Briana Schroeder MRN: [id number] DOB: 1946/01/26   Dear Dr. Deanne Coffer:  Thank you for your kind referral of [name] for consultation of neuropathy. Although her history is well known to you, please allow [name] to reiterate it for the purpose of our medical record. The patient was accompanied to the clinic by self.   Briana Schroeder is a 77 y.o. right-handed female with GERD, hypertension, and polyarthralgia presenting for evaluation of bilateral feet tingling.   IMPRESSION/PLAN: Bilateral feet tingling, suggestive of neuropathy. Exam shows diminished sensation at the feet with normal strength.  No history of diabetes, alcohol, or family history of neuropathy.   - Check vitamin B12, folate, copper, SPEP with IFE  - Start gabapentin 300mg  at bedtime  - Patient had NCS/EMG performed at [facility], records will be requested  Return to clinic in 3 months  ------------------------------------------------------------- History of present illness: For about the past 5 years, she has tingling sensation over the soles of the feet and tight sensation.  Symptoms are constant without exacerbating or alleviating factors.   Balance is good.  She denies leg weakness.  No history of diabetes, alcohol, or family history of neuropathy.   She also has a lot of polyarthralgia especially in the hips and shoulders.  Notes indicate working diagnosis of PMR.   Out-side paper records, electronic medical record, and images have been reviewed where available and summarized as:  NCS/EMG of the right arm and left leg 11/19/2017: Chronic L5 radiculopathy affecting the RIGHT lower extremity, mild-to-moderate in degree electrically. There is no evidence of a LEFT lumbosacral radiculopathy, sensorimotor polyneuropathy, or diffuse myopathy.      Lab Results  Component Value Date   HGBA1C 6.6 (H) 10/11/2021   Lab  Results  Component Value Date   VITAMINB12 1,459 (H) 04/26/2022   Lab Results  Component Value Date   TSH 0.68 01/26/2022   Lab Results  Component Value Date   ESRSEDRATE 116 (H) 04/26/2022    Past Medical History:  Diagnosis Date   ALLERGIC RHINITIS 08/02/2009   Allergy    ANXIETY 10/18/2008   ARM PAIN, RIGHT 04/04/2009   Arthritis    BACK PAIN 08/02/2009   CHEST PAIN 12/05/2009   COLONIC POLYPS, HX OF 10/18/2008   DIZZINESS 12/05/2009   EPIGASTRIC PAIN 01/09/2010   FATIGUE 09/06/2009   GERD 12/06/2009   HOARSENESS 09/06/2009   HYPERLIPIDEMIA 10/18/2008   HYPERTENSION 10/18/2008   IBS 10/18/2008   MYALGIA 03/21/2009   Osteopenia 12/2018   T score -1.7 FRAX 12% / 2.5% table from prior DEXA 2018   PULMONARY SARCOIDOSIS 10/18/2008   SINUSITIS- ACUTE-NOS 03/21/2009   VOCAL CORD NODULE 09/06/2009    Past Surgical History:  Procedure Laterality Date   ABDOMINAL HYSTERECTOMY  08/1992   TAH-DR G FOR FIBROIDS AND MENORRHAGIA   COLONOSCOPY  11-24-2009   3 polyps, +TA and HPP   hx of vocal cord nodules     MUSCLE BIOPSY Right 08/23/2022   Procedure: RIGHT DELTOID MUSCLE BIOPSY;  Surgeon: Almond Lint, MD;  Location: [facility];  Service: General;  Laterality: Right;   s/p right finger tendon surgury     UPPER GASTROINTESTINAL ENDOSCOPY  01-08-2010   normal     Medications:  Outpatient Encounter Medications as of 11/13/2022  Medication Sig   amLODipine (NORVASC) 10 MG tablet Take 1 tablet by mouth once daily   ascorbic acid (VITAMIN C)  500 MG tablet Take 500 mg by mouth daily.   cetirizine (ZYRTEC) 10 MG tablet Take 1 tablet by mouth once daily   cyclobenzaprine (FLEXERIL) 5 MG tablet Take 1 tablet (5 mg total) by mouth at bedtime as needed for muscle spasms.   losartan (COZAAR) 50 MG tablet Take 1 tablet (50 mg total) by mouth daily.   meloxicam (MOBIC) 15 MG tablet TAKE 1 TABLET BY MOUTH ONCE DAILY AS NEEDED   Methylcobalamin (B-12) 5000 MCG TBDP Take 5,000 mcg by mouth  daily.   oxyCODONE (OXY IR/ROXICODONE) 5 MG immediate release tablet Take 1 tablet (5 mg total) by mouth every 6 (six) hours as needed for severe pain.   pantoprazole (PROTONIX) 40 MG tablet Take 1 tablet by mouth once daily   traMADol (ULTRAM) 50 MG tablet TAKE 1 TABLET BY MOUTH EVERY 6 HOURS AS NEEDED   VITAMIN D, CHOLECALCIFEROL, PO Take 1 tablet by mouth daily.   No facility-administered encounter medications on file as of 11/13/2022.    Allergies: No Known Allergies  Family History: Family History  Problem Relation Age of Onset   Diabetes Mother    Hypertension Mother    Breast cancer Mother 2   Hypertension Sister    Colon cancer Neg Hx    Colon polyps Neg Hx    Rectal cancer Neg Hx    Stomach cancer Neg Hx    Esophageal cancer Neg Hx     Social History: Social History   Tobacco Use   Smoking status: Never   Smokeless tobacco: Never  Vaping Use   Vaping Use: Never used  Substance Use Topics   Alcohol use: No    Alcohol/week: 0.0 standard drinks of alcohol   Drug use: No   Social History   Social History Narrative   Right Handed    Lives in a two story home. Lives with husband    Vital Signs:  BP 128/88   Pulse (!) 105 Comment: Rushing to get here  Ht 5\' 3"  (1.6 m)   Wt 131 lb (59.4 kg)   BMI 23.21 kg/m    Neurological Exam: MENTAL STATUS including orientation to time, place, person, recent and remote memory, attention span and concentration, language, and fund of knowledge is normal.  Speech is not dysarthric.  CRANIAL NERVES: II:  No visual field defects.     III-IV-VI: Pupils equal round and reactive to light.  Normal conjugate, extra-ocular eye movements in all directions of gaze.  No nystagmus.  No ptosis.   V:  Normal facial sensation.    VII:  Normal facial symmetry and movements.   VIII:  Normal hearing and vestibular function.   IX-X:  Normal palatal movement.   XI:  Normal shoulder shrug and head rotation.   XII:  Normal tongue strength  and range of motion, no deviation or fasciculation.  MOTOR:  No atrophy, fasciculations or abnormal movements.  No pronator drift.   Upper Extremity:  Right  Left  Deltoid  5/5   5/5   Biceps  5/5   5/5   Triceps  5/5   5/5   Wrist extensors  5/5   5/5   Wrist flexors  5/5   5/5   Finger extensors  5/5   5/5   Finger flexors  5/5   5/5   Dorsal interossei  5/5   5/5   Abductor pollicis  5/5   5/5   Tone (Ashworth scale)  0  0   Lower  Extremity:  Right  Left  Hip flexors  5/5   5/5   Knee flexors  5/5   5/5   Knee extensors  5/5   5/5   Dorsiflexors  5/5   5/5   Plantarflexors  5/5   5/5   Toe extensors  5/5   5/5   Toe flexors  5/5   5/5   Tone (Ashworth scale)  0  0   MSRs:                                           Right        Left brachioradialis 2+  2+  biceps 2+  2+  triceps 2+  2+  patellar 2+  2+  ankle jerk 0  0  Hoffman no  no  plantar response down  down   SENSORY:  Vibration reduced below the ankles.  Pin prick and temperature reduced at the feet.  Romberg's sign absent.   COORDINATION/GAIT: Normal finger-to- nose-finger.  Intact rapid alternating movements bilaterally.  Gait narrow based and stable.        Thank you for allowing me to participate in patient's care.  If I can answer any additional questions, I would be pleased to do so.    Sincerely,    Diara Chaudhari K. Allena Katz, DO

## 2022-11-13 NOTE — Telephone Encounter (Signed)
Need to call patients Referring doctor and request EMG records.

## 2022-11-13 NOTE — Patient Instructions (Addendum)
Check labs  Start gabapentin 300mg  at bedtime  We will request your prior nerve testing results  Return to clinic in 3 months

## 2022-11-14 NOTE — Telephone Encounter (Signed)
Called patients PCP office and was informed that they have not received any EMG result for patient. Patient was last in office in February.

## 2022-11-15 NOTE — Telephone Encounter (Signed)
We will need to request EMG from [facility] [address]  She may need to sign a release.

## 2022-11-16 NOTE — Telephone Encounter (Signed)
Called patient and left a detailed message per DPR that we will need her to contact [facility] and have them fax the results over to [name] or she may come into the office to sign a release so we may request results. Provided our office fax number for patient. Informed patient to contact us if she has any questions.

## 2022-11-18 LAB — PROTEIN ELECTROPHORESIS, SERUM
Albumin ELP: 3.7 g/dL — ABNORMAL LOW (ref 3.8–4.8)
Alpha 1: 0.4 g/dL — ABNORMAL HIGH (ref 0.2–0.3)
Alpha 2: 0.9 g/dL (ref 0.5–0.9)
Beta 2: 0.4 g/dL (ref 0.2–0.5)
Beta Globulin: 0.4 g/dL (ref 0.4–0.6)
Gamma Globulin: 1.1 g/dL (ref 0.8–1.7)
Total Protein: 6.8 g/dL (ref 6.1–8.1)

## 2022-11-18 LAB — IMMUNOFIXATION ELECTROPHORESIS
IgG (Immunoglobin G), Serum: 1252 mg/dL (ref 600–1540)
IgM, Serum: 113 mg/dL (ref 50–300)
Immunoglobulin A: 129 mg/dL (ref 70–320)

## 2022-11-18 LAB — COPPER, SERUM: Copper: 149 ug/dL (ref 70–175)

## 2022-11-20 ENCOUNTER — Other Ambulatory Visit: Payer: Self-pay

## 2022-11-20 ENCOUNTER — Telehealth: Payer: Self-pay

## 2022-11-20 DIAGNOSIS — R202 Paresthesia of skin: Secondary | ICD-10-CM

## 2022-11-20 NOTE — Telephone Encounter (Signed)
-----   Message from Donika K Patel, DO sent at 11/20/2022  3:17 PM EDT ----- Please let pt know labs are normal.  I also reviewed her EMG that she had a [facility].  It looks like the testing was limited to the right leg.  I would like to perform repeat testing on both legs to look for neuropathy.  If she is interested, please order EMG BLE. Thanks. 

## 2022-11-20 NOTE — Telephone Encounter (Signed)
-----   Message from Glendale Chard, DO sent at 11/20/2022  3:17 PM EDT ----- Please let pt know labs are normal.  I also reviewed her EMG that she had a [facility].  It looks like the testing was limited to the right leg.  I would like to perform repeat testing on both legs to look for neuropathy.  If she is interested, please order EMG BLE. Thanks.

## 2022-11-29 ENCOUNTER — Other Ambulatory Visit: Payer: Self-pay

## 2022-11-29 ENCOUNTER — Ambulatory Visit (INDEPENDENT_AMBULATORY_CARE_PROVIDER_SITE_OTHER): Payer: Medicare Other | Admitting: Neurology

## 2022-11-29 DIAGNOSIS — R202 Paresthesia of skin: Secondary | ICD-10-CM | POA: Diagnosis not present

## 2022-11-29 DIAGNOSIS — M5416 Radiculopathy, lumbar region: Secondary | ICD-10-CM

## 2022-11-29 NOTE — Procedures (Signed)
[facility]  [address] [place], [address]  [place], [place] [address] Tel: [phone] Fax: [phone] Test Date:  11/29/2022  Patient: Briana Schroeder DOB: Sep 02, 1945 Physician: Nita Sickle, DO  Sex: Female Height: 5\' 3"  Ref Phys: Nita Sickle, DO  ID#: [id number]   Technician:    History: This is a 77 year old female referred for evaluation of bilateral feet paresthesias.  NCV & EMG Findings: Extensive electrodiagnostic testing of the right lower extremity and additional studies of the left shows:  Bilateral sural and superficial peroneal sensory responses are within normal limits. Peroneal motor response at the extensor digitorum brevis is absent on the right and shows reduced amplitude on the left (R1.0 mV).  Bilateral peroneal motor responses at the tibialis anterior is within normal limits.  Bilateral tibial motor responses are within normal limits. Bilateral tibial H reflex studies are within normal limits. Chronic motor axon loss changes are seen affecting the L5 myotome bilaterally, without accompanying active denervation.  Impression: Chronic L5 radiculopathy affecting bilateral lower extremities, mild and worse on the right. There is no evidence of a large fiber sensorimotor polyneuropathy affecting the lower extremities.   ___________________________ Nita Sickle, DO    Nerve Conduction Studies   Stim Site NR Peak (ms) Norm Peak (ms) O-P Amp (V) Norm O-P Amp  Left Sup Peroneal Anti Sensory (Ant Lat Mall)  32 C  12 cm    2.9 <4.6 8.5 >3  Right Sup Peroneal Anti Sensory (Ant Lat Mall)  32 C  12 cm    2.6 <4.6 8.8 >3  Left Sural Anti Sensory (Lat Mall)  32 C  Calf    2.5 <4.6 16.7 >3  Right Sural Anti Sensory (Lat Mall)  32 C  Calf    2.2 <4.6 12.0 >3     Stim Site NR Onset (ms) Norm Onset (ms) O-P Amp (mV) Norm O-P Amp Site1 Site2 Delta-0 (ms) Dist (cm) Vel (m/s) Norm Vel (m/s)  Left Peroneal Motor (Ext Dig Brev)  32 C  Ankle    5.6 <6.0  *1.0 >2.5 B Fib Ankle 8.2 34.0 41 >40  B Fib    13.8  0.8  Poplt B Fib 1.6 7.0 44 >40  Poplt    15.4  0.8         Right Peroneal Motor (Ext Dig Brev)  32 C  Ankle *NR  <6.0  >2.5 B Fib Ankle  0.0  >40  B Fib *NR     Poplt B Fib  0.0  >40  Poplt *NR            Left Peroneal TA Motor (Tib Ant)  32 C  Fib Head    2.9 <4.5 4.8 >3 Poplit Fib Head 1.4 7.0 50 >40  Poplit    4.3 <5.7 4.7         Right Peroneal TA Motor (Tib Ant)  32 C  Fib Head    2.7 <4.5 4.1 >3 Poplit Fib Head 1.5 7.0 47 >40  Poplit    4.2 <5.7 3.8         Left Tibial Motor (Abd Hall Brev)  32 C  Ankle    4.2 <6.0 7.1 >4 Knee Ankle 7.5 37.0 49 >40  Knee    11.7  3.7         Right Tibial Motor (Abd Hall Brev)  32 C  Ankle    4.3 <6.0 7.1 >4 Knee Ankle 8.1 38.0 47 >40  Knee  12.4  4.0          Electromyography   Side Muscle Ins.Act Fibs Fasc Recrt Amp Dur Poly Activation Comment  Right AntTibialis Nml Nml Nml *1- *1+ *1+ *1+ Nml N/A  Right Gastroc Nml Nml Nml Nml Nml Nml Nml Nml N/A  Right Flex Dig Long Nml Nml Nml *2- *1+ *1+ *1+ Nml N/A  Right RectFemoris Nml Nml Nml Nml Nml Nml Nml Nml N/A  Right BicepsFemS Nml Nml Nml Nml Nml Nml Nml Nml N/A  Right GluteusMed Nml Nml Nml *1- *1+ *1+ *1+ Nml N/A  Left AntTibialis Nml Nml Nml *1- *1+ *1+ *1+ Nml N/A  Left Gastroc Nml Nml Nml Nml Nml Nml Nml Nml N/A  Left Flex Dig Long Nml Nml Nml *1- *1+ *1+ *1+ Nml N/A  Left RectFemoris Nml Nml Nml Nml Nml Nml Nml Nml N/A  Left GluteusMed Nml Nml Nml *1- *1+ *1+ *1+ Nml N/A      Waveforms:

## 2022-11-29 NOTE — Progress Notes (Signed)
Follow-up Visit   Date: 11/29/2022    KIRSI KALATA MRN: [id number] DOB: [birth year]    Briana Schroeder is a 77 y.o. right-handed female with GERD, hypertension, and polyarthralgia returning to the clinic for follow-up of bilateral feet parethesias.  The patient was accompanied to the clinic by self.  IMPRESSION/PLAN: Bilateral feet paresthesias, ?L5 radiculopathy vs small fiber neuropathy.  Discussed options of trying PT for low back strengthening to see if paresthesia are stemming from lumbosacral radiculopathy or we can pursue skin biopsy for small fiber neuropathy.  She would like to try PT first.    --------------------------------------------- UPDATE 11/29/2022:  She is here for EDX.  She continues to have tingling involving both feet.  Prior NCS/EMG performed at [facility] showed findings consistent with right peroneal mononeuropathy, however these findings did match her clinical presentation.  She does not have foot weakness.  She denies low back pain.  Medications:  Current Outpatient Medications on File Prior to Visit  Medication Sig Dispense Refill   amLODipine (NORVASC) 10 MG tablet Take 1 tablet by mouth once daily 90 tablet 1   ascorbic acid (VITAMIN C) 500 MG tablet Take 500 mg by mouth daily.     cetirizine (ZYRTEC) 10 MG tablet Take 1 tablet by mouth once daily 30 tablet 0   cyclobenzaprine (FLEXERIL) 5 MG tablet Take 1 tablet (5 mg total) by mouth at bedtime as needed for muscle spasms. 90 tablet 3   gabapentin (NEURONTIN) 300 MG capsule Take 1 capsule (300 mg total) by mouth at bedtime. 30 capsule 5   losartan (COZAAR) 50 MG tablet Take 1 tablet (50 mg total) by mouth daily. 90 tablet 3   meloxicam (MOBIC) 15 MG tablet TAKE 1 TABLET BY MOUTH ONCE DAILY AS NEEDED 90 tablet 3   Methylcobalamin (B-12) 5000 MCG TBDP Take 5,000 mcg by mouth daily.     oxyCODONE (OXY IR/ROXICODONE) 5 MG immediate release tablet Take 1 tablet (5 mg total) by mouth every 6 (six)  hours as needed for severe pain. 5 tablet 0   pantoprazole (PROTONIX) 40 MG tablet Take 1 tablet by mouth once daily 90 tablet 0   traMADol (ULTRAM) 50 MG tablet TAKE 1 TABLET BY MOUTH EVERY 6 HOURS AS NEEDED 30 tablet 5   VITAMIN D, CHOLECALCIFEROL, PO Take 1 tablet by mouth daily.     No current facility-administered medications on file prior to visit.    Allergies: No Known Allergies  Vital Signs:  There were no vitals taken for this visit.   Neurological Exam: MENTAL STATUS including orientation to time, place, person, recent and remote memory, attention span and concentration, language, and fund of knowledge is normal.  Speech is not dysarthric.  CRANIAL NERVES:  N  Normal conjugate, extra-ocular eye movements in all directions of gaze.  No ptosis.  Face is symmetric.   MOTOR:  Motor strength is 5/5 in all extremities.  No atrophy, fasciculations or abnormal movements.  No pronator drift.  Tone is normal.    COORDINATION/GAIT:  Normal finger-to- nose-finger.  Intact rapid alternating movements bilaterally.  Gait narrow based and stable.   Data: NCS/EMG of the legs 11/29/2022: Chronic L5 radiculopathy affecting bilateral lower extremities, mild and worse on the right. There is no evidence of a large fiber sensorimotor polyneuropathy affecting the lower extremities.   Thank you for allowing me to participate in patient's care.  If I can answer any additional questions, I would be pleased to do so.  Sincerely,    Antwion Carpenter K. Allena Katz, DO

## 2022-12-03 DIAGNOSIS — H25813 Combined forms of age-related cataract, bilateral: Secondary | ICD-10-CM | POA: Diagnosis not present

## 2022-12-07 ENCOUNTER — Other Ambulatory Visit: Payer: Self-pay

## 2022-12-07 ENCOUNTER — Other Ambulatory Visit: Payer: Self-pay | Admitting: Internal Medicine

## 2022-12-13 ENCOUNTER — Telehealth: Payer: Self-pay | Admitting: Internal Medicine

## 2022-12-13 DIAGNOSIS — M25552 Pain in left hip: Secondary | ICD-10-CM | POA: Diagnosis not present

## 2022-12-13 DIAGNOSIS — M5416 Radiculopathy, lumbar region: Secondary | ICD-10-CM | POA: Diagnosis not present

## 2022-12-13 NOTE — Telephone Encounter (Signed)
Patient would ike a tb test for her employment.  Please call patient and let her know when order has been placed.  Patient's number:  [phone]

## 2022-12-13 NOTE — Telephone Encounter (Signed)
I dont know how to place this type of order  We normally ask pt to make Nurse Visit for PPD placement, thanks

## 2022-12-18 DIAGNOSIS — M25552 Pain in left hip: Secondary | ICD-10-CM | POA: Diagnosis not present

## 2022-12-18 DIAGNOSIS — M5416 Radiculopathy, lumbar region: Secondary | ICD-10-CM | POA: Diagnosis not present

## 2022-12-20 DIAGNOSIS — M5416 Radiculopathy, lumbar region: Secondary | ICD-10-CM | POA: Diagnosis not present

## 2022-12-20 DIAGNOSIS — M25552 Pain in left hip: Secondary | ICD-10-CM | POA: Diagnosis not present

## 2022-12-21 ENCOUNTER — Ambulatory Visit (INDEPENDENT_AMBULATORY_CARE_PROVIDER_SITE_OTHER): Payer: Medicare Other

## 2022-12-21 DIAGNOSIS — Z111 Encounter for screening for respiratory tuberculosis: Secondary | ICD-10-CM | POA: Diagnosis not present

## 2022-12-28 ENCOUNTER — Ambulatory Visit: Payer: Medicare Other

## 2023-01-04 ENCOUNTER — Other Ambulatory Visit: Payer: Self-pay | Admitting: Internal Medicine

## 2023-01-04 ENCOUNTER — Ambulatory Visit (INDEPENDENT_AMBULATORY_CARE_PROVIDER_SITE_OTHER): Payer: Medicare Other

## 2023-01-04 ENCOUNTER — Other Ambulatory Visit: Payer: Self-pay

## 2023-01-04 DIAGNOSIS — Z111 Encounter for screening for respiratory tuberculosis: Secondary | ICD-10-CM

## 2023-01-04 NOTE — Progress Notes (Signed)
Tuberculin skin test applied to left ventral forearm. Explained how to read the test, measuring induration not just erythema; she will come into office to have her reading Monday 01/07/2023

## 2023-01-07 ENCOUNTER — Ambulatory Visit: Payer: Medicare Other

## 2023-01-07 LAB — TB SKIN TEST
Induration: NEGATIVE mm
Induration: NEGATIVE mm
TB Skin Test: NEGATIVE
TB Skin Test: NEGATIVE

## 2023-01-07 NOTE — Progress Notes (Signed)
Tb skin test was negative and there were no induration of the skin

## 2023-01-08 ENCOUNTER — Telehealth: Payer: Self-pay | Admitting: Neurology

## 2023-01-08 DIAGNOSIS — M5416 Radiculopathy, lumbar region: Secondary | ICD-10-CM | POA: Diagnosis not present

## 2023-01-08 DIAGNOSIS — M25552 Pain in left hip: Secondary | ICD-10-CM | POA: Diagnosis not present

## 2023-01-08 NOTE — Telephone Encounter (Signed)
Patient is requesting a new order for break through therapy for arms and shoulders. Evlyn Clines

## 2023-01-08 NOTE — Telephone Encounter (Signed)
I have not seen her for arm or shoulder issues, but it does look like she sees Dr. Deanne Coffer for polymyalgia rheumatica.  Recommend that she request referral from their office.

## 2023-01-08 NOTE — Telephone Encounter (Signed)
Called patient and informed her that Dr. Allena Katz does not see her for her arm and shoulder issues and she will need to contact Dr. Deanne Coffer for this referral. Patient verbalized understanding and had no further questions or concerns.

## 2023-01-10 DIAGNOSIS — M353 Polymyalgia rheumatica: Secondary | ICD-10-CM | POA: Diagnosis not present

## 2023-01-10 DIAGNOSIS — Z79899 Other long term (current) drug therapy: Secondary | ICD-10-CM | POA: Diagnosis not present

## 2023-01-10 DIAGNOSIS — R768 Other specified abnormal immunological findings in serum: Secondary | ICD-10-CM | POA: Diagnosis not present

## 2023-01-10 DIAGNOSIS — G5731 Lesion of lateral popliteal nerve, right lower limb: Secondary | ICD-10-CM | POA: Diagnosis not present

## 2023-01-10 DIAGNOSIS — M1990 Unspecified osteoarthritis, unspecified site: Secondary | ICD-10-CM | POA: Diagnosis not present

## 2023-01-10 DIAGNOSIS — R748 Abnormal levels of other serum enzymes: Secondary | ICD-10-CM | POA: Diagnosis not present

## 2023-01-10 DIAGNOSIS — M25512 Pain in left shoulder: Secondary | ICD-10-CM | POA: Diagnosis not present

## 2023-01-10 DIAGNOSIS — R202 Paresthesia of skin: Secondary | ICD-10-CM | POA: Diagnosis not present

## 2023-01-11 DIAGNOSIS — M25552 Pain in left hip: Secondary | ICD-10-CM | POA: Diagnosis not present

## 2023-01-11 DIAGNOSIS — M5416 Radiculopathy, lumbar region: Secondary | ICD-10-CM | POA: Diagnosis not present

## 2023-01-12 ENCOUNTER — Other Ambulatory Visit: Payer: Self-pay | Admitting: Internal Medicine

## 2023-01-15 ENCOUNTER — Encounter: Payer: Self-pay | Admitting: Internal Medicine

## 2023-01-15 ENCOUNTER — Ambulatory Visit (INDEPENDENT_AMBULATORY_CARE_PROVIDER_SITE_OTHER): Payer: Medicare Other | Admitting: Internal Medicine

## 2023-01-15 VITALS — BP 124/72 | HR 72 | Temp 98.3°F | Ht 63.0 in | Wt 126.0 lb

## 2023-01-15 DIAGNOSIS — R7989 Other specified abnormal findings of blood chemistry: Secondary | ICD-10-CM

## 2023-01-15 DIAGNOSIS — M25511 Pain in right shoulder: Secondary | ICD-10-CM

## 2023-01-15 DIAGNOSIS — E7800 Pure hypercholesterolemia, unspecified: Secondary | ICD-10-CM

## 2023-01-15 DIAGNOSIS — I10 Essential (primary) hypertension: Secondary | ICD-10-CM

## 2023-01-15 DIAGNOSIS — H6121 Impacted cerumen, right ear: Secondary | ICD-10-CM | POA: Diagnosis not present

## 2023-01-15 DIAGNOSIS — R739 Hyperglycemia, unspecified: Secondary | ICD-10-CM

## 2023-01-15 NOTE — Progress Notes (Signed)
Patient ID: Briana Schroeder, female   DOB: 1945/10/13, 77 y.o.   MRN: [id number]        Chief Complaint: follow up recent abnormal LFTs per rheum labs left shoulder pain, right cerumen impaction, htn, hld, hyperglycemia       HPI:  Briana Schroeder is a 77 y.o. female here with recent labs per rheum with mod elevated LFTs. Denies worsening reflux, abd pain, dysphagia, n/v, bowel change or blood.  No new recent meds.  No significant ETOH or other otc use.  Pt denies chest pain, increased sob or doe, wheezing, orthopnea, PND, increased LE swelling, palpitations, dizziness or syncope.   Pt denies polydipsia, polyuria, or new focal neuro s/s.    Pt denies fever, wt loss, night sweats, loss of appetite, or other constitutional symptoms  Does also have reduced hearing to the right ear.  Also has right shoulder pain and reduced ROM for several weeks.         Wt Readings from Last 3 Encounters:  01/17/23 125 lb (56.7 kg)  01/15/23 126 lb (57.2 kg)  11/13/22 131 lb (59.4 kg)   BP Readings from Last 3 Encounters:  01/17/23 116/78  01/15/23 124/72  11/13/22 128/88         Past Medical History:  Diagnosis Date   ALLERGIC RHINITIS 08/02/2009   Allergy    ANXIETY 10/18/2008   ARM PAIN, RIGHT 04/04/2009   Arthritis    BACK PAIN 08/02/2009   CHEST PAIN 12/05/2009   COLONIC POLYPS, HX OF 10/18/2008   DIZZINESS 12/05/2009   EPIGASTRIC PAIN 01/09/2010   FATIGUE 09/06/2009   GERD 12/06/2009   HOARSENESS 09/06/2009   HYPERLIPIDEMIA 10/18/2008   HYPERTENSION 10/18/2008   IBS 10/18/2008   MYALGIA 03/21/2009   Osteopenia 12/2018   T score -1.7 FRAX 12% / 2.5% table from prior DEXA 2018   PULMONARY SARCOIDOSIS 10/18/2008   SINUSITIS- ACUTE-NOS 03/21/2009   VOCAL CORD NODULE 09/06/2009   Past Surgical History:  Procedure Laterality Date   ABDOMINAL HYSTERECTOMY  08/1992   TAH-DR G FOR FIBROIDS AND MENORRHAGIA   COLONOSCOPY  11-24-2009   3 polyps, +TA and HPP   hx of vocal cord nodules      MUSCLE BIOPSY Right 08/23/2022   Procedure: RIGHT DELTOID MUSCLE BIOPSY;  Surgeon: Almond Lint, MD;  Location: [facility];  Service: General;  Laterality: Right;   s/p right finger tendon surgury     UPPER GASTROINTESTINAL ENDOSCOPY  01-08-2010   normal    reports that she has never smoked. She has never used smokeless tobacco. She reports that she does not drink alcohol and does not use drugs. family history includes Breast cancer (age of onset: 41) in her mother; Diabetes in her mother; Hypertension in her mother and sister. No Known Allergies Current Outpatient Medications on File Prior to Visit  Medication Sig Dispense Refill   amLODipine (NORVASC) 10 MG tablet Take 1 tablet by mouth once daily 90 tablet 1   ascorbic acid (VITAMIN C) 500 MG tablet Take 500 mg by mouth daily.     cetirizine (ZYRTEC) 10 MG tablet Take 1 tablet by mouth once daily 30 tablet 0   cyclobenzaprine (FLEXERIL) 5 MG tablet Take 1 tablet (5 mg total) by mouth at bedtime as needed for muscle spasms. 90 tablet 3   gabapentin (NEURONTIN) 300 MG capsule Take 1 capsule (300 mg total) by mouth at bedtime. 30 capsule 5   losartan (COZAAR) 50 MG tablet Take 1 tablet (50  mg total) by mouth daily. 90 tablet 3   meloxicam (MOBIC) 15 MG tablet TAKE 1 TABLET BY MOUTH ONCE DAILY AS NEEDED 90 tablet 3   Methylcobalamin (B-12) 5000 MCG TBDP Take 5,000 mcg by mouth daily.     oxyCODONE (OXY IR/ROXICODONE) 5 MG immediate release tablet Take 1 tablet (5 mg total) by mouth every 6 (six) hours as needed for severe pain. 5 tablet 0   pantoprazole (PROTONIX) 40 MG tablet Take 1 tablet by mouth once daily 90 tablet 0   traMADol (ULTRAM) 50 MG tablet TAKE 1 TABLET BY MOUTH EVERY 6 HOURS AS NEEDED 30 tablet 5   VITAMIN D, CHOLECALCIFEROL, PO Take 1 tablet by mouth daily.     No current facility-administered medications on file prior to visit.        ROS:  All others reviewed and negative.  Objective        PE:  BP 124/72 (BP Location:  Right Arm, Patient Position: Sitting, Cuff Size: Normal)   Pulse 72   Temp 98.3 F (36.8 C) (Oral)   Ht 5\' 3"  (1.6 m)   Wt 126 lb (57.2 kg)   SpO2 99%   BMI 22.32 kg/m                 Constitutional: Pt appears in NAD               HENT: Head: NCAT.                Right Ear: External ear normal.  Right cerumen impaction resolved with irrigation               Left Ear: External ear normal.                Eyes: . Pupils are equal, round, and reactive to light. Conjunctivae and EOM are normal               Nose: without d/c or deformity               Neck: Neck supple. Gross normal ROM               Cardiovascular: Normal rate and regular rhythm.                 Pulmonary/Chest: Effort normal and breath sounds without rales or wheezing.                Abd:  Soft, NT, ND, + BS, no organomegaly               Neurological: Pt is alert. At baseline orientation, motor grossly intact               Skin: Skin is warm. No rashes, no other new lesions, LE edema - none               Psychiatric: Pt behavior is normal without agitation   Micro: none  Cardiac tracings I have personally interpreted today:  none  Pertinent Radiological findings (summarize): none   Lab Results  Component Value Date   WBC 6.7 08/10/2022   HGB 13.0 08/10/2022   HCT 41.2 08/10/2022   PLT 256 08/10/2022   GLUCOSE 115 (H) 08/10/2022   CHOL 176 10/11/2021   TRIG 84.0 10/11/2021   HDL 80.30 10/11/2021   LDLDIRECT 83.3 11/17/2012   LDLCALC 79 10/11/2021   ALT 20 04/26/2022   AST 31 04/26/2022   NA 138 08/10/2022   K  3.9 08/10/2022   CL 104 08/10/2022   CREATININE 0.78 08/10/2022   BUN 20 08/10/2022   CO2 27 08/10/2022   TSH 0.68 01/26/2022   HGBA1C 6.6 (H) 10/11/2021   Assessment/Plan:  Briana Schroeder is a 77 y.o. Black or African American [2] female with  has a past medical history of ALLERGIC RHINITIS (08/02/2009), Allergy, ANXIETY (10/18/2008), ARM PAIN, RIGHT (04/04/2009), Arthritis, BACK PAIN  (08/02/2009), CHEST PAIN (12/05/2009), COLONIC POLYPS, HX OF (10/18/2008), DIZZINESS (12/05/2009), EPIGASTRIC PAIN (01/09/2010), FATIGUE (09/06/2009), GERD (12/06/2009), HOARSENESS (09/06/2009), HYPERLIPIDEMIA (10/18/2008), HYPERTENSION (10/18/2008), IBS (10/18/2008), MYALGIA (03/21/2009), Osteopenia (12/2018), PULMONARY SARCOIDOSIS (10/18/2008), SINUSITIS- ACUTE-NOS (03/21/2009), and VOCAL CORD NODULE (09/06/2009).  Essential hypertension BP Readings from Last 3 Encounters:  01/17/23 116/78  01/15/23 124/72  11/13/22 128/88   Stable, pt to continue medical treatment amlodpion 10 every day, losartan 50 qd   Hyperglycemia Lab Results  Component Value Date   HGBA1C 6.6 (H) 10/11/2021   Stable, pt to continue current medical treatment  - diet, wt control   Hyperlipidemia Lab Results  Component Value Date   LDLCALC 79 10/11/2021   Uncontrolled, goal ldl < 70, pt to continue low chol diet, decliens statin for now   Right ear impacted cerumen Resolved with irrigation, Ceruminosis is noted.  Wax is removed by syringing and manual debridement. Instructions for home care to prevent wax buildup are given.   Right shoulder pain Can't r/o rot cuff disorder - for referral sports medicine  Abnormal LFTs Etiology unclear, for abdomen u/s  Followup: Return if symptoms worsen or fail to improve.  Oliver Barre, MD 01/18/2023 8:20 PM [facility]

## 2023-01-15 NOTE — Patient Instructions (Signed)
Your right ear was improved today with irrigation  Please continue all other medications as before, and refills have been done if requested.  Please have the pharmacy call with any other refills you may need.  Please continue your efforts at being more active, low cholesterol diet, and weight control.  Please keep your appointments with your specialists as you may have planned  You will be contacted regarding the referral for: abdomen ultrasound to check the liver and gallbladder, as well as Sports Medicine for the right shoulder pain  No further lab work needed today

## 2023-01-16 NOTE — Progress Notes (Signed)
I, Stevenson Clinch, CMA acting as a scribe for Briana Graham, MD.  Briana Schroeder is a 77 y.o. female who presents to [facility] at [facility] today for cont'd R shoulder pain. Pt was last seen by Dr. Denyse Amass on 06/14/22 for bilat shoulder pain and possible polymyalgia rheumatica and was prescribed prednisone and referred to rheumatology.  Today, pt reports starting back on Prednisone per Rheumatology, triturating down over 4 months. Reports continued pain in the right shoulder. Continues with PT twice weekly, needs new referral to [facility] on [address] to allow them to work on the shoulder. Some pain with reaching back and reaching up. Eats with arm rested on the table because its too weak and painful to hold up. Occasional n/t, but not dropping things. Pt is RHD.   Dx imaging: 12/27/21 Chest XR             08/09/21 chest & L shoulder XR             07/23/20 R shoulder MRI             04/07/20 R shoulder XR             06/27/17 L shoulder XR             05/15/17 Chest XR  Pertinent review of systems: No fevers or chills  Relevant historical information: Polymyalgia rheumatica currently treated with steroids Rotator cuff tear right shoulder seen on MRI 2022  Exam:  BP 116/78   Pulse 71   Ht 5\' 3"  (1.6 m)   Wt 125 lb (56.7 kg)   SpO2 97%   BMI 22.14 kg/m  General: Well Developed, well nourished, and in no acute distress.   MSK: Right shoulder. Intact range of motion. Decreased strength abduction limited 4/5.  External and internal rotation strength is intact.     Lab and Radiology Results  Procedure: Real-time Ultrasound Guided Injection of right shoulder glenohumeral joint posterior approach Device: Philips Affiniti 50G/GE Logiq Images permanently stored and available for review in PACS Verbal informed consent obtained.  Discussed risks and benefits of procedure. Warned about infection, bleeding, hyperglycemia damage to structures among  others. Patient expresses understanding and agreement Time-out conducted.   Noted no overlying erythema, induration, or other signs of local infection.   Skin prepped in a sterile fashion.   Local anesthesia: Topical Ethyl chloride.   With sterile technique and under real time ultrasound guidance: 40 mg of Kenalog and 2 mL of Marcaine injected into glenohumeral joint. Fluid seen entering the joint capsule.   Completed without difficulty   Pain immediately resolved suggesting accurate placement of the medication.   Advised to call if fevers/chills, erythema, induration, drainage, or persistent bleeding.   Images permanently stored and available for review in the ultrasound unit.  Impression: Technically successful ultrasound guided injection.        Assessment and Plan: 77 y.o. female with right shoulder pain thought to be due to chronic rotator cuff tear arthroscopy and probably polymyalgia rheumatica.  Plan for interarticular steroid injection.  Will ask physical therapy to work on the shoulder as well.  Check back for this if not better.  Additionally she should check back for her left hip and leg pain if not better.   PDMP not reviewed this encounter. Orders Placed This Encounter  Procedures   [name] LIMITED JOINT SPACE STRUCTURES UP RIGHT(NO LINKED CHARGES)    Order Specific Question:   Reason for Exam (SYMPTOM  OR DIAGNOSIS REQUIRED)    Answer:   right shoulder pain    Order Specific Question:   Preferred imaging location?    Answer:   [facility] referral to Physical Therapy    Referral Priority:   Routine    Referral Type:   Physical Medicine    Referral Reason:   Specialty Services Required    Requested Specialty:   Physical Therapy    Number of Visits Requested:   1   No orders of the defined types were placed in this encounter.    Discussed warning signs or symptoms. Please see discharge instructions. Patient expresses  understanding.  The above documentation has been reviewed and is accurate and complete Briana Schroeder, M.D.

## 2023-01-17 ENCOUNTER — Other Ambulatory Visit: Payer: Self-pay

## 2023-01-17 ENCOUNTER — Ambulatory Visit (INDEPENDENT_AMBULATORY_CARE_PROVIDER_SITE_OTHER): Payer: Medicare Other | Admitting: Family Medicine

## 2023-01-17 ENCOUNTER — Encounter: Payer: Self-pay | Admitting: Family Medicine

## 2023-01-17 VITALS — BP 116/78 | HR 71 | Ht 63.0 in | Wt 125.0 lb

## 2023-01-17 DIAGNOSIS — M25552 Pain in left hip: Secondary | ICD-10-CM | POA: Diagnosis not present

## 2023-01-17 DIAGNOSIS — M25511 Pain in right shoulder: Secondary | ICD-10-CM

## 2023-01-17 DIAGNOSIS — G8929 Other chronic pain: Secondary | ICD-10-CM | POA: Diagnosis not present

## 2023-01-17 DIAGNOSIS — M5416 Radiculopathy, lumbar region: Secondary | ICD-10-CM | POA: Diagnosis not present

## 2023-01-17 NOTE — Patient Instructions (Signed)
Thank you for coming in today.   You received an injection today. Seek immediate medical attention if the joint becomes red, extremely painful, or is oozing fluid.   I've referred you to Physical Therapy.  Let us know if you don't hear from them in one week.   Check back as needed. If not better, please let me know.

## 2023-01-18 ENCOUNTER — Encounter: Payer: Self-pay | Admitting: Internal Medicine

## 2023-01-18 NOTE — Assessment & Plan Note (Signed)
Resolved with irrigation, Ceruminosis is noted.  Wax is removed by syringing and manual debridement. Instructions for home care to prevent wax buildup are given.  

## 2023-01-18 NOTE — Assessment & Plan Note (Signed)
Can't r/o rot cuff disorder - for referral sports medicine

## 2023-01-18 NOTE — Assessment & Plan Note (Signed)
Lab Results  Component Value Date   HGBA1C 6.6 (H) 10/11/2021   Stable, pt to continue current medical treatment  - diet, wt control

## 2023-01-18 NOTE — Assessment & Plan Note (Signed)
Lab Results  Component Value Date   LDLCALC 79 10/11/2021   Uncontrolled, goal ldl < 70, pt to continue low chol diet, decliens statin for now

## 2023-01-18 NOTE — Assessment & Plan Note (Signed)
Etiology unclear, for abdomen u/s

## 2023-01-18 NOTE — Assessment & Plan Note (Signed)
BP Readings from Last 3 Encounters:  01/17/23 116/78  01/15/23 124/72  11/13/22 128/88   Stable, pt to continue medical treatment amlodpion 10 every day, losartan 50 qd

## 2023-01-20 ENCOUNTER — Other Ambulatory Visit: Payer: Self-pay | Admitting: Internal Medicine

## 2023-01-22 DIAGNOSIS — M5416 Radiculopathy, lumbar region: Secondary | ICD-10-CM | POA: Diagnosis not present

## 2023-01-22 DIAGNOSIS — M25552 Pain in left hip: Secondary | ICD-10-CM | POA: Diagnosis not present

## 2023-01-25 DIAGNOSIS — M75101 Unspecified rotator cuff tear or rupture of right shoulder, not specified as traumatic: Secondary | ICD-10-CM | POA: Diagnosis not present

## 2023-01-25 DIAGNOSIS — M25511 Pain in right shoulder: Secondary | ICD-10-CM | POA: Diagnosis not present

## 2023-01-29 ENCOUNTER — Ambulatory Visit
Admission: RE | Admit: 2023-01-29 | Discharge: 2023-01-29 | Disposition: A | Discharge status: Home / Self Care | Payer: Medicare Other | Source: Ambulatory Visit | Attending: Internal Medicine | Admitting: Internal Medicine

## 2023-01-29 DIAGNOSIS — K760 Fatty (change of) liver, not elsewhere classified: Secondary | ICD-10-CM | POA: Diagnosis not present

## 2023-01-29 DIAGNOSIS — R7989 Other specified abnormal findings of blood chemistry: Secondary | ICD-10-CM

## 2023-01-31 DIAGNOSIS — M75101 Unspecified rotator cuff tear or rupture of right shoulder, not specified as traumatic: Secondary | ICD-10-CM | POA: Diagnosis not present

## 2023-01-31 DIAGNOSIS — M25511 Pain in right shoulder: Secondary | ICD-10-CM | POA: Diagnosis not present

## 2023-02-02 ENCOUNTER — Other Ambulatory Visit: Payer: Self-pay | Admitting: Internal Medicine

## 2023-02-05 ENCOUNTER — Encounter: Payer: Self-pay | Admitting: Rheumatology

## 2023-02-05 DIAGNOSIS — M75101 Unspecified rotator cuff tear or rupture of right shoulder, not specified as traumatic: Secondary | ICD-10-CM | POA: Diagnosis not present

## 2023-02-05 DIAGNOSIS — M25511 Pain in right shoulder: Secondary | ICD-10-CM | POA: Diagnosis not present

## 2023-02-12 DIAGNOSIS — M75101 Unspecified rotator cuff tear or rupture of right shoulder, not specified as traumatic: Secondary | ICD-10-CM | POA: Diagnosis not present

## 2023-02-12 DIAGNOSIS — M25511 Pain in right shoulder: Secondary | ICD-10-CM | POA: Diagnosis not present

## 2023-02-14 DIAGNOSIS — M25552 Pain in left hip: Secondary | ICD-10-CM | POA: Diagnosis not present

## 2023-02-14 DIAGNOSIS — M5416 Radiculopathy, lumbar region: Secondary | ICD-10-CM | POA: Diagnosis not present

## 2023-02-19 ENCOUNTER — Ambulatory Visit (INDEPENDENT_AMBULATORY_CARE_PROVIDER_SITE_OTHER): Payer: Medicare Other | Admitting: Neurology

## 2023-02-19 ENCOUNTER — Encounter: Payer: Self-pay | Admitting: Neurology

## 2023-02-19 VITALS — BP 126/77 | HR 77 | Ht 63.0 in | Wt 126.0 lb

## 2023-02-19 DIAGNOSIS — R202 Paresthesia of skin: Secondary | ICD-10-CM

## 2023-02-19 MED ORDER — GABAPENTIN 300 MG PO CAPS: 300.0000 mg | ORAL_CAPSULE | Freq: Every day | ORAL | 1 refills | Status: DC

## 2023-02-19 NOTE — Patient Instructions (Addendum)
Continue gabapentin 300mg  at bedtime  Please follow-up with Dr. Denyse Amass for your left hip pain  I will see you back in 6 months

## 2023-02-19 NOTE — Progress Notes (Signed)
Follow-up Visit   Date: 02/19/2023    Briana Schroeder MRN: [id number] DOB: Dec 08, 1945    Briana Schroeder is a 77 y.o. right-handed female with GERD, hypertension, and polyarthralgia returning to the clinic for follow-up of bilateral feet parethesias.  The patient was accompanied to the clinic by self.  IMPRESSION/PLAN: Bilateral feet paresthesias, improved on gabapentin.  NCS/EMG from May did not show large fiber neuropathy, it did suggest possible bilateral L5 radiculopathy.   She has started PT and will continue this.  I will keep her on gabapentin 300mg  at bedtime.  If her symptoms get worse, consider MRI lumbar spine.  Left hip pain seems musculoskeletal.  I have asked her to follow-up with Dr. Denyse Amass for evaluation.   Return to clinic in 6 months  --------------------------------------------- UPDATE 11/29/2022:  She is here for EDX.  She continues to have tingling involving both feet.  Prior NCS/EMG performed at [facility] showed findings consistent with right peroneal mononeuropathy, however these findings did match her clinical presentation.  She does not have foot weakness.  She denies low back pain.  UPDATE 02/19/2023:  Since her last visit, her feet tingling has improved.  She takes gabapentin 300mg  at bedtime which helps.  Her primary issue today is achy and throbbing left hip pain.  She denies weakness.  Pain is improved with tramadol, which is prescribed by her PCP.    Medications:  Current Outpatient Medications on File Prior to Visit  Medication Sig Dispense Refill   amLODipine (NORVASC) 10 MG tablet Take 1 tablet by mouth once daily 90 tablet 1   ascorbic acid (VITAMIN C) 500 MG tablet Take 500 mg by mouth daily.     cetirizine (ZYRTEC) 10 MG tablet Take 1 tablet by mouth once daily 30 tablet 5   cyclobenzaprine (FLEXERIL) 5 MG tablet Take 1 tablet (5 mg total) by mouth at bedtime as needed for muscle spasms. 90 tablet 3   gabapentin (NEURONTIN) 300 MG capsule  Take 1 capsule (300 mg total) by mouth at bedtime. 30 capsule 5   losartan (COZAAR) 50 MG tablet Take 1 tablet (50 mg total) by mouth daily. 90 tablet 3   meloxicam (MOBIC) 15 MG tablet TAKE 1 TABLET BY MOUTH ONCE DAILY AS NEEDED 90 tablet 3   Methylcobalamin (B-12) 5000 MCG TBDP Take 5,000 mcg by mouth daily.     oxyCODONE (OXY IR/ROXICODONE) 5 MG immediate release tablet Take 1 tablet (5 mg total) by mouth every 6 (six) hours as needed for severe pain. 5 tablet 0   pantoprazole (PROTONIX) 40 MG tablet Take 1 tablet by mouth once daily 90 tablet 0   traMADol (ULTRAM) 50 MG tablet Take 1 tablet (50 mg total) by mouth every 6 (six) hours as needed. 30 tablet 2   VITAMIN D, CHOLECALCIFEROL, PO Take 1 tablet by mouth daily.     No current facility-administered medications on file prior to visit.    Allergies: No Known Allergies  Vital Signs:  BP 126/77   Pulse 77   Ht 5\' 3"  (1.6 m)   Wt 126 lb (57.2 kg)   SpO2 99%   BMI 22.32 kg/m    Neurological Exam: MENTAL STATUS including orientation to time, place, person, recent and remote memory, attention span and concentration, language, and fund of knowledge is normal.  Speech is not dysarthric.  CRANIAL NERVES:   Normal conjugate, extra-ocular eye movements in all directions of gaze.  No ptosis.  Face is symmetric.  MOTOR:  Motor strength is 5/5 in all extremities.  No atrophy, fasciculations or abnormal movements.  No pronator drift.  Tone is normal.  FABER test is positive on the left.   COORDINATION/GAIT:  Normal finger-to- nose-finger.  Intact rapid alternating movements bilaterally.  Gait slightly antalgic, unassisted, stable.  Data: NCS/EMG of the legs 11/29/2022: Chronic L5 radiculopathy affecting bilateral lower extremities, mild and worse on the right. There is no evidence of a large fiber sensorimotor polyneuropathy affecting the lower extremities.   Thank you for allowing me to participate in patient's care.  If I can answer  any additional questions, I would be pleased to do so.    Sincerely,    Kiaira Pointer K. Allena Katz, DO

## 2023-02-26 DIAGNOSIS — M25511 Pain in right shoulder: Secondary | ICD-10-CM | POA: Diagnosis not present

## 2023-02-26 DIAGNOSIS — M75101 Unspecified rotator cuff tear or rupture of right shoulder, not specified as traumatic: Secondary | ICD-10-CM | POA: Diagnosis not present

## 2023-02-28 DIAGNOSIS — M5416 Radiculopathy, lumbar region: Secondary | ICD-10-CM | POA: Diagnosis not present

## 2023-03-01 ENCOUNTER — Other Ambulatory Visit: Payer: Self-pay | Admitting: Internal Medicine

## 2023-03-06 DIAGNOSIS — H25813 Combined forms of age-related cataract, bilateral: Secondary | ICD-10-CM | POA: Diagnosis not present

## 2023-03-06 DIAGNOSIS — H16143 Punctate keratitis, bilateral: Secondary | ICD-10-CM | POA: Diagnosis not present

## 2023-03-06 DIAGNOSIS — H43823 Vitreomacular adhesion, bilateral: Secondary | ICD-10-CM | POA: Diagnosis not present

## 2023-03-07 DIAGNOSIS — M25552 Pain in left hip: Secondary | ICD-10-CM | POA: Diagnosis not present

## 2023-03-07 DIAGNOSIS — M5416 Radiculopathy, lumbar region: Secondary | ICD-10-CM | POA: Diagnosis not present

## 2023-03-08 DIAGNOSIS — M5416 Radiculopathy, lumbar region: Secondary | ICD-10-CM | POA: Diagnosis not present

## 2023-03-13 DIAGNOSIS — M25552 Pain in left hip: Secondary | ICD-10-CM | POA: Diagnosis not present

## 2023-03-13 DIAGNOSIS — M5416 Radiculopathy, lumbar region: Secondary | ICD-10-CM | POA: Diagnosis not present

## 2023-03-18 DIAGNOSIS — M25552 Pain in left hip: Secondary | ICD-10-CM | POA: Diagnosis not present

## 2023-03-18 DIAGNOSIS — M5416 Radiculopathy, lumbar region: Secondary | ICD-10-CM | POA: Diagnosis not present

## 2023-03-20 DIAGNOSIS — M5416 Radiculopathy, lumbar region: Secondary | ICD-10-CM | POA: Diagnosis not present

## 2023-03-26 ENCOUNTER — Other Ambulatory Visit: Payer: Self-pay | Admitting: Internal Medicine

## 2023-03-26 ENCOUNTER — Other Ambulatory Visit: Payer: Self-pay

## 2023-03-29 ENCOUNTER — Ambulatory Visit: Payer: Medicare Other | Admitting: Internal Medicine

## 2023-03-31 ENCOUNTER — Other Ambulatory Visit: Payer: Self-pay | Admitting: Internal Medicine

## 2023-04-02 ENCOUNTER — Encounter: Payer: Self-pay | Admitting: Internal Medicine

## 2023-04-02 ENCOUNTER — Ambulatory Visit (INDEPENDENT_AMBULATORY_CARE_PROVIDER_SITE_OTHER): Payer: Medicare Other | Admitting: Internal Medicine

## 2023-04-02 ENCOUNTER — Ambulatory Visit (INDEPENDENT_AMBULATORY_CARE_PROVIDER_SITE_OTHER): Payer: Medicare Other

## 2023-04-02 VITALS — BP 134/82 | HR 65 | Temp 98.4°F | Ht 63.0 in | Wt 126.0 lb

## 2023-04-02 DIAGNOSIS — E7800 Pure hypercholesterolemia, unspecified: Secondary | ICD-10-CM | POA: Diagnosis not present

## 2023-04-02 DIAGNOSIS — E538 Deficiency of other specified B group vitamins: Secondary | ICD-10-CM | POA: Diagnosis not present

## 2023-04-02 DIAGNOSIS — M25511 Pain in right shoulder: Secondary | ICD-10-CM

## 2023-04-02 DIAGNOSIS — E559 Vitamin D deficiency, unspecified: Secondary | ICD-10-CM | POA: Diagnosis not present

## 2023-04-02 DIAGNOSIS — H6121 Impacted cerumen, right ear: Secondary | ICD-10-CM

## 2023-04-02 DIAGNOSIS — I10 Essential (primary) hypertension: Secondary | ICD-10-CM | POA: Diagnosis not present

## 2023-04-02 DIAGNOSIS — R739 Hyperglycemia, unspecified: Secondary | ICD-10-CM

## 2023-04-02 DIAGNOSIS — M5416 Radiculopathy, lumbar region: Secondary | ICD-10-CM

## 2023-04-02 DIAGNOSIS — M85811 Other specified disorders of bone density and structure, right shoulder: Secondary | ICD-10-CM | POA: Diagnosis not present

## 2023-04-02 LAB — CBC WITH DIFFERENTIAL/PLATELET
Basophils Absolute: 0 10*3/uL (ref 0.0–0.1)
Basophils Relative: 0.8 % (ref 0.0–3.0)
Eosinophils Absolute: 0.2 10*3/uL (ref 0.0–0.7)
Eosinophils Relative: 4 % (ref 0.0–5.0)
HCT: 37.1 % (ref 36.0–46.0)
Hemoglobin: 11.9 g/dL — ABNORMAL LOW (ref 12.0–15.0)
Lymphocytes Relative: 26.1 % (ref 12.0–46.0)
Lymphs Abs: 1.5 10*3/uL (ref 0.7–4.0)
MCHC: 32.2 g/dL (ref 30.0–36.0)
MCV: 91.2 fL (ref 78.0–100.0)
Monocytes Absolute: 0.5 10*3/uL (ref 0.1–1.0)
Monocytes Relative: 9 % (ref 3.0–12.0)
Neutro Abs: 3.6 10*3/uL (ref 1.4–7.7)
Neutrophils Relative %: 60.1 % (ref 43.0–77.0)
Platelets: 341 10*3/uL (ref 150.0–400.0)
RBC: 4.07 Mil/uL (ref 3.87–5.11)
RDW: 15 % (ref 11.5–15.5)
WBC: 5.9 10*3/uL (ref 4.0–10.5)

## 2023-04-02 LAB — BASIC METABOLIC PANEL
BUN: 20 mg/dL (ref 6–23)
CO2: 29 meq/L (ref 19–32)
Calcium: 9.9 mg/dL (ref 8.4–10.5)
Chloride: 102 meq/L (ref 96–112)
Creatinine, Ser: 0.63 mg/dL (ref 0.40–1.20)
GFR: 84.91 mL/min (ref >60.00)
Glucose, Bld: 96 mg/dL (ref 70–99)
Potassium: 4.1 meq/L (ref 3.5–5.1)
Sodium: 137 meq/L (ref 135–145)

## 2023-04-02 LAB — URINALYSIS, ROUTINE W REFLEX MICROSCOPIC
Bilirubin Urine: NEGATIVE
Hgb urine dipstick: NEGATIVE
Ketones, ur: NEGATIVE
Nitrite: NEGATIVE
RBC / HPF: NONE SEEN (ref 0–?)
Specific Gravity, Urine: 1.02 (ref 1.000–1.030)
Total Protein, Urine: NEGATIVE
Urine Glucose: NEGATIVE
Urobilinogen, UA: 0.2 (ref 0.0–1.0)
pH: 6.5 (ref 5.0–8.0)

## 2023-04-02 LAB — HEPATIC FUNCTION PANEL
ALT: 30 U/L (ref 0–35)
AST: 36 U/L (ref 0–37)
Albumin: 3.9 g/dL (ref 3.5–5.2)
Alkaline Phosphatase: 31 U/L — ABNORMAL LOW (ref 39–117)
Bilirubin, Direct: 0.1 mg/dL (ref 0.0–0.3)
Total Bilirubin: 0.4 mg/dL (ref 0.2–1.2)
Total Protein: 7.3 g/dL (ref 6.0–8.3)

## 2023-04-02 LAB — TSH: TSH: 0.76 u[IU]/mL (ref 0.35–5.50)

## 2023-04-02 LAB — LIPID PANEL
Cholesterol: 203 mg/dL — ABNORMAL HIGH (ref 0–200)
HDL: 76.3 mg/dL (ref >39.00)
LDL Cholesterol: 111 mg/dL — ABNORMAL HIGH (ref 0–99)
NonHDL: 126.21
Total CHOL/HDL Ratio: 3
Triglycerides: 76 mg/dL (ref 0.0–149.0)
VLDL: 15.2 mg/dL (ref 0.0–40.0)

## 2023-04-02 LAB — MICROALBUMIN / CREATININE URINE RATIO
Creatinine,U: 75 mg/dL
Microalb Creat Ratio: 2.1 mg/g (ref 0.0–30.0)
Microalb, Ur: 1.6 mg/dL (ref 0.0–1.9)

## 2023-04-02 LAB — VITAMIN D 25 HYDROXY (VIT D DEFICIENCY, FRACTURES): VITD: 54.44 ng/mL (ref 30.00–100.00)

## 2023-04-02 LAB — HEMOGLOBIN A1C: Hgb A1c MFr Bld: 6.1 % (ref 4.6–6.5)

## 2023-04-02 LAB — VITAMIN B12: Vitamin B-12: 1444 pg/mL — ABNORMAL HIGH (ref 211–911)

## 2023-04-02 NOTE — Progress Notes (Signed)
Patient ID: Briana Schroeder, female   DOB: 1946/01/04, 77 y.o.   MRN: [id number]        Chief Complaint: follow up left ear cerumen impaction, right shoulder pain, left lumbar radiculopathy, htn, hyperglycemia, hld       HPI:  Briana Schroeder is a 77 y.o. female here with c/o 2 mo worsening right shoulder pain and reduced ROM but good power.  No injury.  Also with 2 mo worsening left lower back and leg pain with weakness mild worsening, but no falls but pain worse at night and lying down, gabapentin at bedtime not working well, fell last pm without injury  Also with right ear cerumen and reduced hearing in the past week.  Pt denies chest pain, increased sob or doe, wheezing, orthopnea, PND, increased LE swelling, palpitations, dizziness or syncope.   Pt denies polydipsia, polyuria, or new focal neuro s/s.    Pt denies fever, wt loss, night sweats, loss of appetite, or other constitutional symptoms         Wt Readings from Last 3 Encounters:  04/02/23 126 lb (57.2 kg)  02/19/23 126 lb (57.2 kg)  01/17/23 125 lb (56.7 kg)   BP Readings from Last 3 Encounters:  04/02/23 134/82  02/19/23 126/77  01/17/23 116/78         Past Medical History:  Diagnosis Date   ALLERGIC RHINITIS 08/02/2009   Allergy    ANXIETY 10/18/2008   ARM PAIN, RIGHT 04/04/2009   Arthritis    BACK PAIN 08/02/2009   CHEST PAIN 12/05/2009   COLONIC POLYPS, HX OF 10/18/2008   DIZZINESS 12/05/2009   EPIGASTRIC PAIN 01/09/2010   FATIGUE 09/06/2009   GERD 12/06/2009   HOARSENESS 09/06/2009   HYPERLIPIDEMIA 10/18/2008   HYPERTENSION 10/18/2008   IBS 10/18/2008   MYALGIA 03/21/2009   Osteopenia 12/2018   T score -1.7 FRAX 12% / 2.5% table from prior DEXA 2018   PULMONARY SARCOIDOSIS 10/18/2008   SINUSITIS- ACUTE-NOS 03/21/2009   VOCAL CORD NODULE 09/06/2009   Past Surgical History:  Procedure Laterality Date   ABDOMINAL HYSTERECTOMY  08/1992   TAH-DR G FOR FIBROIDS AND MENORRHAGIA   COLONOSCOPY  11-24-2009   3  polyps, +TA and HPP   hx of vocal cord nodules     MUSCLE BIOPSY Right 08/23/2022   Procedure: RIGHT DELTOID MUSCLE BIOPSY;  Surgeon: Almond Lint, MD;  Location: [facility];  Service: General;  Laterality: Right;   s/p right finger tendon surgury     UPPER GASTROINTESTINAL ENDOSCOPY  01-08-2010   normal    reports that she has never smoked. She has never used smokeless tobacco. She reports that she does not drink alcohol and does not use drugs. family history includes Breast cancer (age of onset: 20) in her mother; Diabetes in her mother; Hypertension in her mother and sister. No Known Allergies Current Outpatient Medications on File Prior to Visit  Medication Sig Dispense Refill   amLODipine (NORVASC) 10 MG tablet Take 1 tablet by mouth once daily 90 tablet 3   ascorbic acid (VITAMIN C) 500 MG tablet Take 500 mg by mouth daily.     cetirizine (ZYRTEC) 10 MG tablet Take 1 tablet by mouth once daily 30 tablet 5   cyclobenzaprine (FLEXERIL) 5 MG tablet Take 1 tablet (5 mg total) by mouth at bedtime as needed for muscle spasms. 90 tablet 3   gabapentin (NEURONTIN) 300 MG capsule Take 1 capsule (300 mg total) by mouth at bedtime. 90 capsule 1  losartan (COZAAR) 50 MG tablet Take 1 tablet (50 mg total) by mouth daily. 90 tablet 3   meloxicam (MOBIC) 15 MG tablet TAKE 1 TABLET BY MOUTH ONCE DAILY AS NEEDED 90 tablet 3   Methylcobalamin (B-12) 5000 MCG TBDP Take 5,000 mcg by mouth daily.     pantoprazole (PROTONIX) 40 MG tablet Take 1 tablet by mouth once daily 90 tablet 0   traMADol (ULTRAM) 50 MG tablet TAKE 1 TABLET BY MOUTH EVERY 6 HOURS AS NEEDED 60 tablet 2   VITAMIN D, CHOLECALCIFEROL, PO Take 1 tablet by mouth daily.     No current facility-administered medications on file prior to visit.        ROS:  All others reviewed and negative.  Objective        PE:  BP 134/82 (BP Location: Left Arm, Patient Position: Sitting, Cuff Size: Normal)   Pulse 65   Temp 98.4 F (36.9 C) (Oral)   Ht  5\' 3"  (1.6 m)   Wt 126 lb (57.2 kg)   SpO2 99%   BMI 22.32 kg/m                 Constitutional: Pt appears in NAD               HENT: Head: NCAT.                Right Ear: External ear normal.                 Left Ear: External ear normal.                Eyes: . Pupils are equal, round, and reactive to light. Conjunctivae and EOM are normal               Nose: without d/c or deformity               Neck: Neck supple. Gross normal ROM               Cardiovascular: Normal rate and regular rhythm.                 Pulmonary/Chest: Effort normal and breath sounds without rales or wheezing.                Abd:  Soft, NT, ND, + BS, no organomegaly               Neurological: Pt is alert. At baseline orientation, motor with 4/5 LLE motor weakness; right shoulder nontender to palpate but reduced ROM to abduction               Skin: Skin is warm. No rashes, no other new lesions, LE edema - none               Psychiatric: Pt behavior is normal without agitation   Micro: none  Cardiac tracings I have personally interpreted today:  none  Pertinent Radiological findings (summarize): none   Lab Results  Component Value Date   WBC 5.9 04/02/2023   HGB 11.9 (L) 04/02/2023   HCT 37.1 04/02/2023   PLT 341.0 04/02/2023   GLUCOSE 96 04/02/2023   CHOL 203 (H) 04/02/2023   TRIG 76.0 04/02/2023   HDL 76.30 04/02/2023   LDLDIRECT 83.3 11/17/2012   LDLCALC 111 (H) 04/02/2023   ALT 30 04/02/2023   AST 36 04/02/2023   NA 137 04/02/2023   K 4.1 04/02/2023   CL 102 04/02/2023   CREATININE 0.63  04/02/2023   BUN 20 04/02/2023   CO2 29 04/02/2023   TSH 0.76 04/02/2023   HGBA1C 6.1 04/02/2023   MICROALBUR 1.6 04/02/2023   Assessment/Plan:  Briana Schroeder is a 77 y.o. Black or African American [2] female with  has a past medical history of ALLERGIC RHINITIS (08/02/2009), Allergy, ANXIETY (10/18/2008), ARM PAIN, RIGHT (04/04/2009), Arthritis, BACK PAIN (08/02/2009), CHEST PAIN (12/05/2009), COLONIC  POLYPS, HX OF (10/18/2008), DIZZINESS (12/05/2009), EPIGASTRIC PAIN (01/09/2010), FATIGUE (09/06/2009), GERD (12/06/2009), HOARSENESS (09/06/2009), HYPERLIPIDEMIA (10/18/2008), HYPERTENSION (10/18/2008), IBS (10/18/2008), MYALGIA (03/21/2009), Osteopenia (12/2018), PULMONARY SARCOIDOSIS (10/18/2008), SINUSITIS- ACUTE-NOS (03/21/2009), and VOCAL CORD NODULE (09/06/2009).  Essential hypertension BP Readings from Last 3 Encounters:  04/02/23 134/82  02/19/23 126/77  01/17/23 116/78   Stable, pt to continue medical treatment norvasc 10 every day, losartan 50 qd   Hyperglycemia Lab Results  Component Value Date   HGBA1C 6.1 04/02/2023   Stable, pt to continue current medical treatment   diet,wt control   Hyperlipidemia Lab Results  Component Value Date   LDLCALC 111 (H) 04/02/2023   Uncontrolled, pt for lower chol diet, declines statin   Left lumbar radiculopathy 2 mo worsening now with recurrent falls and left leg weakness - for MRI LS spine, refer ortho  Right ear impacted cerumen Cerumen removed as documented, hearing improved,  to f/u any worsening symptoms or concerns  Right shoulder pain Chronic worsening, recent PT and cortisone no help per pt, for xray, also refer ortho  B12 deficiency Lab Results  Component Value Date   VITAMINB12 1,444 (H) 04/02/2023   Stable, cont oral replacement - b12 1000 mcg qd  Followup: Return in about 6 months (around 10/01/2023).  Oliver Barre, MD 04/06/2023 2:17 PM [facility] [place] [facility]

## 2023-04-02 NOTE — Patient Instructions (Signed)
Your ear was irrigated of wax today  Please continue all other medications as before, and refills have been done if requested.  Please have the pharmacy call with any other refills you may need.  Please continue your efforts at being more active, low cholesterol diet, and weight control.  You are otherwise up to date with prevention measures today.  Please keep your appointments with your specialists as you may have planned  You will be contacted regarding the referral for: Orthopedic for the Right shoulder AND the left leg pain weakness  You will be contacted regarding the referral for: MRI for the lower back  Please go to the XRAY Department in the first floor for the x-ray testing  Please go to the LAB at the blood drawing area for the tests to be done  You will be contacted by phone if any changes need to be made immediately.  Otherwise, you will receive a letter about your results with an explanation, but please check with MyChart first.  Please make an Appointment to return in 6 months, or sooner if needed

## 2023-04-06 ENCOUNTER — Encounter: Payer: Self-pay | Admitting: Internal Medicine

## 2023-04-06 NOTE — Assessment & Plan Note (Signed)
Lab Results  Component Value Date   VITAMINB12 1,444 (H) 04/02/2023   Stable, cont oral replacement - b12 1000 mcg qd

## 2023-04-06 NOTE — Assessment & Plan Note (Signed)
Lab Results  Component Value Date   HGBA1C 6.1 04/02/2023   Stable, pt to continue current medical treatment   diet,wt control

## 2023-04-06 NOTE — Assessment & Plan Note (Signed)
Chronic worsening, recent PT and cortisone no help per pt, for xray, also refer ortho

## 2023-04-06 NOTE — Assessment & Plan Note (Signed)
Lab Results  Component Value Date   LDLCALC 111 (H) 04/02/2023   Uncontrolled, pt for lower chol diet, declines statin

## 2023-04-06 NOTE — Assessment & Plan Note (Signed)
BP Readings from Last 3 Encounters:  04/02/23 134/82  02/19/23 126/77  01/17/23 116/78   Stable, pt to continue medical treatment norvasc 10 every day, losartan 50 qd

## 2023-04-06 NOTE — Assessment & Plan Note (Signed)
2 mo worsening now with recurrent falls and left leg weakness - for MRI LS spine, refer ortho

## 2023-04-06 NOTE — Assessment & Plan Note (Signed)
Cerumen removed as documented, hearing improved,  to f/u any worsening symptoms or concerns

## 2023-04-16 DIAGNOSIS — M25511 Pain in right shoulder: Secondary | ICD-10-CM | POA: Diagnosis not present

## 2023-04-16 DIAGNOSIS — M25512 Pain in left shoulder: Secondary | ICD-10-CM | POA: Diagnosis not present

## 2023-04-18 ENCOUNTER — Other Ambulatory Visit: Payer: Self-pay | Admitting: Internal Medicine

## 2023-04-18 DIAGNOSIS — Z1231 Encounter for screening mammogram for malignant neoplasm of breast: Secondary | ICD-10-CM

## 2023-04-20 ENCOUNTER — Ambulatory Visit
Admission: RE | Admit: 2023-04-20 | Discharge: 2023-04-20 | Disposition: A | Discharge status: Home / Self Care | Payer: Medicare Other | Source: Ambulatory Visit | Attending: Internal Medicine | Admitting: Internal Medicine

## 2023-04-20 DIAGNOSIS — M47816 Spondylosis without myelopathy or radiculopathy, lumbar region: Secondary | ICD-10-CM | POA: Diagnosis not present

## 2023-04-20 DIAGNOSIS — M5126 Other intervertebral disc displacement, lumbar region: Secondary | ICD-10-CM | POA: Diagnosis not present

## 2023-04-20 DIAGNOSIS — M4316 Spondylolisthesis, lumbar region: Secondary | ICD-10-CM | POA: Diagnosis not present

## 2023-04-20 DIAGNOSIS — M5416 Radiculopathy, lumbar region: Secondary | ICD-10-CM

## 2023-05-08 DIAGNOSIS — H16143 Punctate keratitis, bilateral: Secondary | ICD-10-CM | POA: Diagnosis not present

## 2023-05-08 DIAGNOSIS — H04123 Dry eye syndrome of bilateral lacrimal glands: Secondary | ICD-10-CM | POA: Diagnosis not present

## 2023-05-21 ENCOUNTER — Ambulatory Visit
Admission: RE | Admit: 2023-05-21 | Discharge: 2023-05-21 | Disposition: A | Discharge status: Home / Self Care | Payer: Medicare Other | Source: Ambulatory Visit | Attending: Internal Medicine | Admitting: Internal Medicine

## 2023-05-21 DIAGNOSIS — Z1231 Encounter for screening mammogram for malignant neoplasm of breast: Secondary | ICD-10-CM

## 2023-05-28 DIAGNOSIS — R768 Other specified abnormal immunological findings in serum: Secondary | ICD-10-CM | POA: Diagnosis not present

## 2023-05-28 DIAGNOSIS — G5731 Lesion of lateral popliteal nerve, right lower limb: Secondary | ICD-10-CM | POA: Diagnosis not present

## 2023-05-28 DIAGNOSIS — M1990 Unspecified osteoarthritis, unspecified site: Secondary | ICD-10-CM | POA: Diagnosis not present

## 2023-05-28 DIAGNOSIS — R748 Abnormal levels of other serum enzymes: Secondary | ICD-10-CM | POA: Diagnosis not present

## 2023-05-28 DIAGNOSIS — R202 Paresthesia of skin: Secondary | ICD-10-CM | POA: Diagnosis not present

## 2023-05-28 DIAGNOSIS — Z79899 Other long term (current) drug therapy: Secondary | ICD-10-CM | POA: Diagnosis not present

## 2023-05-28 DIAGNOSIS — M353 Polymyalgia rheumatica: Secondary | ICD-10-CM | POA: Diagnosis not present

## 2023-06-04 DIAGNOSIS — H04123 Dry eye syndrome of bilateral lacrimal glands: Secondary | ICD-10-CM | POA: Diagnosis not present

## 2023-06-04 DIAGNOSIS — H16143 Punctate keratitis, bilateral: Secondary | ICD-10-CM | POA: Diagnosis not present

## 2023-06-12 ENCOUNTER — Ambulatory Visit: Payer: Medicare Other | Admitting: Internal Medicine

## 2023-06-12 ENCOUNTER — Encounter: Payer: Self-pay | Admitting: Internal Medicine

## 2023-06-12 VITALS — BP 130/82 | HR 82 | Temp 99.2°F | Ht 63.0 in | Wt 123.8 lb

## 2023-06-12 DIAGNOSIS — S46212A Strain of muscle, fascia and tendon of other parts of biceps, left arm, initial encounter: Secondary | ICD-10-CM

## 2023-06-12 DIAGNOSIS — R739 Hyperglycemia, unspecified: Secondary | ICD-10-CM | POA: Diagnosis not present

## 2023-06-12 DIAGNOSIS — I10 Essential (primary) hypertension: Secondary | ICD-10-CM

## 2023-06-12 DIAGNOSIS — E538 Deficiency of other specified B group vitamins: Secondary | ICD-10-CM | POA: Diagnosis not present

## 2023-06-12 DIAGNOSIS — E7800 Pure hypercholesterolemia, unspecified: Secondary | ICD-10-CM

## 2023-06-12 DIAGNOSIS — M8580 Other specified disorders of bone density and structure, unspecified site: Secondary | ICD-10-CM | POA: Diagnosis not present

## 2023-06-12 NOTE — Patient Instructions (Signed)
You have a left bicep tendon rupture  You also have mild osteopenia (mild bone loss which is not as severe as osteoporosis)  Please continue all other medications as before, and refills have been done if requested.  Please have the pharmacy call with any other refills you may need.  Please continue your efforts at being more active, low cholesterol diet, and weight control.  Please keep your appointments with your specialists as you may have planned  You will be contacted regarding the referral for: [facility] for the left upper arm

## 2023-06-12 NOTE — Progress Notes (Unsigned)
Patient ID: Briana Schroeder, female   DOB: June 20, 1946, 77 y.o.   MRN: [id number]        Chief Complaint: follow up left biceps tendon rupture, osteopenia, htn, hyperglycemia, low b12       HPI:  Briana Schroeder is a 77 y.o. female here with c/o 3 days onset sudden bunched up muscle left bicep but not sure how it happened as denies heavy lifting or other traums.  Pt denies chest pain, increased sob or doe, wheezing, orthopnea, PND, increased LE swelling, palpitations, dizziness or syncope.   Pt denies polydipsia, polyuria, or new focal neuro s/s.    Pt denies fever, wt loss, night sweats, loss of appetite, or other constitutional symptoms Has osteopenia by DXA jan 2023, continues to deny specific tx for now.  Pt denies chest pain, increased sob or doe, wheezing, orthopnea, PND, increased LE swelling, palpitations, dizziness or syncope.   Pt denies polydipsia, polyuria, or new focal neuro s/s.          Wt Readings from Last 3 Encounters:  06/12/23 123 lb 12.8 oz (56.2 kg)  04/02/23 126 lb (57.2 kg)  02/19/23 126 lb (57.2 kg)   BP Readings from Last 3 Encounters:  06/12/23 130/82  04/02/23 134/82  02/19/23 126/77         Past Medical History:  Diagnosis Date   ALLERGIC RHINITIS 08/02/2009   Allergy    ANXIETY 10/18/2008   ARM PAIN, RIGHT 04/04/2009   Arthritis    BACK PAIN 08/02/2009   CHEST PAIN 12/05/2009   COLONIC POLYPS, HX OF 10/18/2008   DIZZINESS 12/05/2009   EPIGASTRIC PAIN 01/09/2010   FATIGUE 09/06/2009   GERD 12/06/2009   HOARSENESS 09/06/2009   HYPERLIPIDEMIA 10/18/2008   HYPERTENSION 10/18/2008   IBS 10/18/2008   MYALGIA 03/21/2009   Osteopenia 12/2018   T score -1.7 FRAX 12% / 2.5% table from prior DEXA 2018   PULMONARY SARCOIDOSIS 10/18/2008   SINUSITIS- ACUTE-NOS 03/21/2009   VOCAL CORD NODULE 09/06/2009   Past Surgical History:  Procedure Laterality Date   ABDOMINAL HYSTERECTOMY  08/1992   TAH-DR G FOR FIBROIDS AND MENORRHAGIA   COLONOSCOPY  11-24-2009    3 polyps, +TA and HPP   hx of vocal cord nodules     MUSCLE BIOPSY Right 08/23/2022   Procedure: RIGHT DELTOID MUSCLE BIOPSY;  Surgeon: Almond Lint, MD;  Location: [facility];  Service: General;  Laterality: Right;   s/p right finger tendon surgury     UPPER GASTROINTESTINAL ENDOSCOPY  01-08-2010   normal    reports that she has never smoked. She has never used smokeless tobacco. She reports that she does not drink alcohol and does not use drugs. family history includes Breast cancer (age of onset: 58) in her mother; Diabetes in her mother; Hypertension in her mother and sister. No Known Allergies Current Outpatient Medications on File Prior to Visit  Medication Sig Dispense Refill   amLODipine (NORVASC) 10 MG tablet Take 1 tablet by mouth once daily 90 tablet 3   ascorbic acid (VITAMIN C) 500 MG tablet Take 500 mg by mouth daily.     cetirizine (ZYRTEC) 10 MG tablet Take 1 tablet by mouth once daily 30 tablet 5   cyclobenzaprine (FLEXERIL) 5 MG tablet Take 1 tablet (5 mg total) by mouth at bedtime as needed for muscle spasms. 90 tablet 3   gabapentin (NEURONTIN) 300 MG capsule Take 1 capsule (300 mg total) by mouth at bedtime. 90 capsule 1  losartan (COZAAR) 50 MG tablet Take 1 tablet (50 mg total) by mouth daily. 90 tablet 3   meloxicam (MOBIC) 15 MG tablet TAKE 1 TABLET BY MOUTH ONCE DAILY AS NEEDED 90 tablet 3   Methylcobalamin (B-12) 5000 MCG TBDP Take 5,000 mcg by mouth daily.     pantoprazole (PROTONIX) 40 MG tablet Take 1 tablet by mouth once daily 90 tablet 0   traMADol (ULTRAM) 50 MG tablet TAKE 1 TABLET BY MOUTH EVERY 6 HOURS AS NEEDED 60 tablet 2   VITAMIN D, CHOLECALCIFEROL, PO Take 1 tablet by mouth daily.     No current facility-administered medications on file prior to visit.        ROS:  All others reviewed and negative.  Objective        PE:  BP 130/82   Pulse 82   Temp 99.2 F (37.3 C)   Ht 5\' 3"  (1.6 m)   Wt 123 lb 12.8 oz (56.2 kg)   SpO2 97%   BMI 21.93  kg/m                 Constitutional: Pt appears in NAD               HENT: Head: NCAT.                Right Ear: External ear normal.                 Left Ear: External ear normal.                Eyes: . Pupils are equal, round, and reactive to light. Conjunctivae and EOM are normal               Nose: without d/c or deformity               Neck: Neck supple. Gross normal ROM               Cardiovascular: Normal rate and regular rhythm.                 Pulmonary/Chest: Effort normal and breath sounds without rales or wheezing.                Abd:  Soft, NT, ND, + BS, no organomegaly               Neurological: Pt is alert. At baseline orientation, motor grossly intact               Skin: Skin is warm. No rashes, no other new lesions, LE edema - none, left medial ruptured bicep tendon with retracted bicep non tender               Psychiatric: Pt behavior is normal without agitation   Micro: none  Cardiac tracings I have personally interpreted today:  none  Pertinent Radiological findings (summarize): none   Lab Results  Component Value Date   WBC 5.9 04/02/2023   HGB 11.9 (L) 04/02/2023   HCT 37.1 04/02/2023   PLT 341.0 04/02/2023   GLUCOSE 96 04/02/2023   CHOL 203 (H) 04/02/2023   TRIG 76.0 04/02/2023   HDL 76.30 04/02/2023   LDLDIRECT 83.3 11/17/2012   LDLCALC 111 (H) 04/02/2023   ALT 30 04/02/2023   AST 36 04/02/2023   NA 137 04/02/2023   K 4.1 04/02/2023   CL 102 04/02/2023   CREATININE 0.63 04/02/2023   BUN 20 04/02/2023   CO2 29 04/02/2023  TSH 0.76 04/02/2023   HGBA1C 6.1 04/02/2023   MICROALBUR 1.6 04/02/2023   Assessment/Plan:  Briana Schroeder is a 77 y.o. Black or African American [2] female with  has a past medical history of ALLERGIC RHINITIS (08/02/2009), Allergy, ANXIETY (10/18/2008), ARM PAIN, RIGHT (04/04/2009), Arthritis, BACK PAIN (08/02/2009), CHEST PAIN (12/05/2009), COLONIC POLYPS, HX OF (10/18/2008), DIZZINESS (12/05/2009), EPIGASTRIC PAIN  (01/09/2010), FATIGUE (09/06/2009), GERD (12/06/2009), HOARSENESS (09/06/2009), HYPERLIPIDEMIA (10/18/2008), HYPERTENSION (10/18/2008), IBS (10/18/2008), MYALGIA (03/21/2009), Osteopenia (12/2018), PULMONARY SARCOIDOSIS (10/18/2008), SINUSITIS- ACUTE-NOS (03/21/2009), and VOCAL CORD NODULE (09/06/2009).  Rupture of left biceps tendon New onset, little pain, now for ortho referral  Osteopenia Mild to mod, declines tx such as fosamax,  to f/u any worsening symptoms or concerns  Hyperglycemia Lab Results  Component Value Date   HGBA1C 6.1 04/02/2023   Stable, pt to continue current medical treatment  - diet, wt control   Essential hypertension BP Readings from Last 3 Encounters:  06/12/23 130/82  04/02/23 134/82  02/19/23 126/77   Stable, pt to continue medical treatment norvasc 10 every day, losartan 50 qd   B12 deficiency Lab Results  Component Value Date   VITAMINB12 1,444 (H) 04/02/2023   Stable, cont oral replacement - b12 1000 mcg qd   Hyperlipidemia Lab Results  Component Value Date   LDLCALC 111 (H) 04/02/2023   uncontrolled, pt for lower chol diet, declines statin for now  Followup: Return if symptoms worsen or fail to improve.  Oliver Barre, MD 06/13/2023 9:44 PM [facility] [place] [facility] - [facility]

## 2023-06-13 ENCOUNTER — Encounter: Payer: Self-pay | Admitting: Internal Medicine

## 2023-06-13 DIAGNOSIS — S46212A Strain of muscle, fascia and tendon of other parts of biceps, left arm, initial encounter: Secondary | ICD-10-CM | POA: Insufficient documentation

## 2023-06-13 DIAGNOSIS — I70223 Atherosclerosis of native arteries of extremities with rest pain, bilateral legs: Secondary | ICD-10-CM | POA: Insufficient documentation

## 2023-06-13 NOTE — Assessment & Plan Note (Signed)
Lab Results  Component Value Date   HGBA1C 6.1 04/02/2023   Stable, pt to continue current medical treatment   diet,wt control

## 2023-06-13 NOTE — Assessment & Plan Note (Signed)
New onset, little pain, now for ortho referral

## 2023-06-13 NOTE — Assessment & Plan Note (Signed)
Lab Results  Component Value Date   LDLCALC 111 (H) 04/02/2023   uncontrolled, pt for lower chol diet, declines statin for now

## 2023-06-13 NOTE — Assessment & Plan Note (Signed)
Lab Results  Component Value Date   VITAMINB12 1,444 (H) 04/02/2023   Stable, cont oral replacement - b12 1000 mcg qd

## 2023-06-13 NOTE — Assessment & Plan Note (Signed)
Mild to mod, declines tx such as fosamax,  to f/u any worsening symptoms or concerns

## 2023-06-13 NOTE — Assessment & Plan Note (Signed)
BP Readings from Last 3 Encounters:  06/12/23 130/82  04/02/23 134/82  02/19/23 126/77   Stable, pt to continue medical treatment norvasc 10 every day, losartan 50 qd

## 2023-07-06 ENCOUNTER — Other Ambulatory Visit: Payer: Self-pay | Admitting: Internal Medicine

## 2023-07-08 ENCOUNTER — Other Ambulatory Visit: Payer: Self-pay

## 2023-07-18 DIAGNOSIS — M66812 Spontaneous rupture of other tendons, left shoulder: Secondary | ICD-10-CM | POA: Diagnosis not present

## 2023-07-18 DIAGNOSIS — M79662 Pain in left lower leg: Secondary | ICD-10-CM | POA: Diagnosis not present

## 2023-07-20 ENCOUNTER — Other Ambulatory Visit: Payer: Self-pay | Admitting: Internal Medicine

## 2023-07-22 ENCOUNTER — Other Ambulatory Visit: Payer: Self-pay

## 2023-07-25 ENCOUNTER — Ambulatory Visit (INDEPENDENT_AMBULATORY_CARE_PROVIDER_SITE_OTHER): Payer: Medicare Other | Admitting: Internal Medicine

## 2023-07-25 ENCOUNTER — Encounter: Payer: Self-pay | Admitting: Internal Medicine

## 2023-07-25 VITALS — BP 120/68 | HR 70 | Temp 98.2°F | Ht 63.0 in | Wt 121.0 lb

## 2023-07-25 DIAGNOSIS — R011 Cardiac murmur, unspecified: Secondary | ICD-10-CM | POA: Diagnosis not present

## 2023-07-25 DIAGNOSIS — I10 Essential (primary) hypertension: Secondary | ICD-10-CM

## 2023-07-25 DIAGNOSIS — R739 Hyperglycemia, unspecified: Secondary | ICD-10-CM | POA: Diagnosis not present

## 2023-07-25 DIAGNOSIS — E7800 Pure hypercholesterolemia, unspecified: Secondary | ICD-10-CM | POA: Diagnosis not present

## 2023-07-25 DIAGNOSIS — H6123 Impacted cerumen, bilateral: Secondary | ICD-10-CM

## 2023-07-25 DIAGNOSIS — E559 Vitamin D deficiency, unspecified: Secondary | ICD-10-CM

## 2023-07-25 DIAGNOSIS — E538 Deficiency of other specified B group vitamins: Secondary | ICD-10-CM | POA: Diagnosis not present

## 2023-07-25 LAB — URINALYSIS, ROUTINE W REFLEX MICROSCOPIC
Bilirubin Urine: NEGATIVE
Hgb urine dipstick: NEGATIVE
Ketones, ur: NEGATIVE
Nitrite: NEGATIVE
Specific Gravity, Urine: 1.02 (ref 1.000–1.030)
Total Protein, Urine: NEGATIVE
Urine Glucose: NEGATIVE
Urobilinogen, UA: 0.2 (ref 0.0–1.0)
pH: 6 (ref 5.0–8.0)

## 2023-07-25 LAB — HEPATIC FUNCTION PANEL
ALT: 32 U/L (ref 0–35)
AST: 39 U/L — ABNORMAL HIGH (ref 0–37)
Albumin: 4.2 g/dL (ref 3.5–5.2)
Alkaline Phosphatase: 32 U/L — ABNORMAL LOW (ref 39–117)
Bilirubin, Direct: 0 mg/dL (ref 0.0–0.3)
Total Bilirubin: 0.3 mg/dL (ref 0.2–1.2)
Total Protein: 7.8 g/dL (ref 6.0–8.3)

## 2023-07-25 LAB — LIPID PANEL
Cholesterol: 200 mg/dL (ref 0–200)
HDL: 69.7 mg/dL (ref >39.00)
LDL Cholesterol: 105 mg/dL — ABNORMAL HIGH (ref 0–99)
NonHDL: 129.97
Total CHOL/HDL Ratio: 3
Triglycerides: 127 mg/dL (ref 0.0–149.0)
VLDL: 25.4 mg/dL (ref 0.0–40.0)

## 2023-07-25 LAB — BASIC METABOLIC PANEL
BUN: 19 mg/dL (ref 6–23)
CO2: 31 meq/L (ref 19–32)
Calcium: 9.9 mg/dL (ref 8.4–10.5)
Chloride: 102 meq/L (ref 96–112)
Creatinine, Ser: 0.81 mg/dL (ref 0.40–1.20)
GFR: 69.33 mL/min (ref >60.00)
Glucose, Bld: 82 mg/dL (ref 70–99)
Potassium: 4.7 meq/L (ref 3.5–5.1)
Sodium: 140 meq/L (ref 135–145)

## 2023-07-25 LAB — CBC WITH DIFFERENTIAL/PLATELET
Basophils Absolute: 0.1 10*3/uL (ref 0.0–0.1)
Basophils Relative: 1 % (ref 0.0–3.0)
Eosinophils Absolute: 0.1 10*3/uL (ref 0.0–0.7)
Eosinophils Relative: 1.9 % (ref 0.0–5.0)
HCT: 38.3 % (ref 36.0–46.0)
Hemoglobin: 12.6 g/dL (ref 12.0–15.0)
Lymphocytes Relative: 17.1 % (ref 12.0–46.0)
Lymphs Abs: 1 10*3/uL (ref 0.7–4.0)
MCHC: 32.8 g/dL (ref 30.0–36.0)
MCV: 92 fL (ref 78.0–100.0)
Monocytes Absolute: 0.5 10*3/uL (ref 0.1–1.0)
Monocytes Relative: 8.6 % (ref 3.0–12.0)
Neutro Abs: 4 10*3/uL (ref 1.4–7.7)
Neutrophils Relative %: 71.4 % (ref 43.0–77.0)
Platelets: 345 10*3/uL (ref 150.0–400.0)
RBC: 4.16 Mil/uL (ref 3.87–5.11)
RDW: 16.8 % — ABNORMAL HIGH (ref 11.5–15.5)
WBC: 5.6 10*3/uL (ref 4.0–10.5)

## 2023-07-25 LAB — HEMOGLOBIN A1C: Hgb A1c MFr Bld: 6.3 % (ref 4.6–6.5)

## 2023-07-25 LAB — VITAMIN B12: Vitamin B-12: 1300 pg/mL — ABNORMAL HIGH (ref 211–911)

## 2023-07-25 LAB — VITAMIN D 25 HYDROXY (VIT D DEFICIENCY, FRACTURES): VITD: 69.69 ng/mL (ref 30.00–100.00)

## 2023-07-25 LAB — TSH: TSH: 0.93 u[IU]/mL (ref 0.35–5.50)

## 2023-07-25 NOTE — Progress Notes (Signed)
The test results show that your current treatment is OK, as the tests are stable.  Please continue the same plan.  There is no other need for change of treatment or further evaluation based on these results, at this time.  thanks 

## 2023-07-25 NOTE — Progress Notes (Signed)
Patient ID: Briana Schroeder, female   DOB: [birth year], 78 y.o.   MRN: [id number]        Chief Complaint: follow up heart murmur, bilat ear wax, hyperglycemia, low vit d       HPI:  Briana Schroeder is a 78 y.o. female here overall doing ok, Pt denies chest pain, increased sob or doe, wheezing, orthopnea, PND, increased LE swelling, palpitations, dizziness or syncope.   Pt denies polydipsia, polyuria, or new focal neuro s/s.    Pt denies fever, night sweats, loss of appetite, or other constitutional symptoms , but has lost wt recently.  Has bilat ear wax impaction with reduced hearing.  Has seen ortho with left bicep tendon rupture - no surgury for now.         Wt Readings from Last 3 Encounters:  07/25/23 121 lb (54.9 kg)  06/12/23 123 lb 12.8 oz (56.2 kg)  04/02/23 126 lb (57.2 kg)   BP Readings from Last 3 Encounters:  07/25/23 120/68  06/12/23 130/82  04/02/23 134/82         Past Medical History:  Diagnosis Date   ALLERGIC RHINITIS 08/02/2009   Allergy    ANXIETY 10/18/2008   ARM PAIN, RIGHT 04/04/2009   Arthritis    BACK PAIN 08/02/2009   CHEST PAIN 12/05/2009   COLONIC POLYPS, HX OF 10/18/2008   DIZZINESS 12/05/2009   EPIGASTRIC PAIN 01/09/2010   FATIGUE 09/06/2009   GERD 12/06/2009   HOARSENESS 09/06/2009   HYPERLIPIDEMIA 10/18/2008   HYPERTENSION 10/18/2008   IBS 10/18/2008   MYALGIA 03/21/2009   Osteopenia 12/2018   T score -1.7 FRAX 12% / 2.5% table from prior DEXA 2018   PULMONARY SARCOIDOSIS 10/18/2008   SINUSITIS- ACUTE-NOS 03/21/2009   VOCAL CORD NODULE 09/06/2009   Past Surgical History:  Procedure Laterality Date   ABDOMINAL HYSTERECTOMY  08/1992   TAH-DR G FOR FIBROIDS AND MENORRHAGIA   COLONOSCOPY  11-24-2009   3 polyps, +TA and HPP   hx of vocal cord nodules     MUSCLE BIOPSY Right 08/23/2022   Procedure: RIGHT DELTOID MUSCLE BIOPSY;  Surgeon: Almond Lint, MD;  Location: [facility];  Service: General;  Laterality: Right;   s/p right finger tendon  surgury     UPPER GASTROINTESTINAL ENDOSCOPY  01-08-2010   normal    reports that she has never smoked. She has never used smokeless tobacco. She reports that she does not drink alcohol and does not use drugs. family history includes Breast cancer (age of onset: 5) in her mother; Diabetes in her mother; Hypertension in her mother and sister. No Known Allergies Current Outpatient Medications on File Prior to Visit  Medication Sig Dispense Refill   amLODipine (NORVASC) 10 MG tablet Take 1 tablet by mouth once daily 90 tablet 3   ascorbic acid (VITAMIN C) 500 MG tablet Take 500 mg by mouth daily.     cetirizine (ZYRTEC) 10 MG tablet Take 1 tablet by mouth once daily 30 tablet 0   cyclobenzaprine (FLEXERIL) 5 MG tablet Take 1 tablet (5 mg total) by mouth at bedtime as needed for muscle spasms. 90 tablet 3   gabapentin (NEURONTIN) 300 MG capsule Take 1 capsule (300 mg total) by mouth at bedtime. 90 capsule 1   losartan (COZAAR) 50 MG tablet Take 1 tablet (50 mg total) by mouth daily. 90 tablet 3   meloxicam (MOBIC) 15 MG tablet TAKE 1 TABLET BY MOUTH ONCE DAILY AS NEEDED 90 tablet 3   Methylcobalamin (  B-12) 5000 MCG TBDP Take 5,000 mcg by mouth daily.     pantoprazole (PROTONIX) 40 MG tablet Take 1 tablet by mouth once daily 90 tablet 0   traMADol (ULTRAM) 50 MG tablet TAKE 1 TABLET BY MOUTH EVERY 6 HOURS AS NEEDED 60 tablet 2   VITAMIN D, CHOLECALCIFEROL, PO Take 1 tablet by mouth daily.     No current facility-administered medications on file prior to visit.        ROS:  All others reviewed and negative.  Objective        PE:  BP 120/68 (BP Location: Right Arm, Patient Position: Sitting, Cuff Size: Normal)   Pulse 70   Temp 98.2 F (36.8 C) (Oral)   Ht 5\' 3"  (1.6 m)   Wt 121 lb (54.9 kg)   SpO2 97%   BMI 21.43 kg/m                 Constitutional: Pt appears in NAD               HENT: Head: NCAT.                Right Ear: External ear normal.                 Left Ear: External ear  normal.                Eyes: . Pupils are equal, round, and reactive to light. Conjunctivae and EOM are normal               Nose: without d/c or deformity               Neck: Neck supple. Gross normal ROM               Cardiovascular: Normal rate and regular rhythm.  With gr 2/6 sys murmur                Pulmonary/Chest: Effort normal and breath sounds without rales or wheezing.                Abd:  Soft, NT, ND, + BS, no organomegaly               Neurological: Pt is alert. At baseline orientation, motor grossly intact               Skin: Skin is warm. No rashes, no other new lesions, LE edema - none               Psychiatric: Pt behavior is normal without agitation   Micro: none  Cardiac tracings I have personally interpreted today:  none  Pertinent Radiological findings (summarize): none   Lab Results  Component Value Date   WBC 5.6 07/25/2023   HGB 12.6 07/25/2023   HCT 38.3 07/25/2023   PLT 345.0 07/25/2023   GLUCOSE 82 07/25/2023   CHOL 200 07/25/2023   TRIG 127.0 07/25/2023   HDL 69.70 07/25/2023   LDLDIRECT 83.3 11/17/2012   LDLCALC 105 (H) 07/25/2023   ALT 32 07/25/2023   AST 39 (H) 07/25/2023   NA 140 07/25/2023   K 4.7 07/25/2023   CL 102 07/25/2023   CREATININE 0.81 07/25/2023   BUN 19 07/25/2023   CO2 31 07/25/2023   TSH 0.93 07/25/2023   HGBA1C 6.3 07/25/2023   MICROALBUR 1.6 04/02/2023   Assessment/Plan:  Briana Schroeder is a 78 y.o. Black or African American [2] female with  has a past  medical history of ALLERGIC RHINITIS (08/02/2009), Allergy, ANXIETY (10/18/2008), ARM PAIN, RIGHT (04/04/2009), Arthritis, BACK PAIN (08/02/2009), CHEST PAIN (12/05/2009), COLONIC POLYPS, HX OF (10/18/2008), DIZZINESS (12/05/2009), EPIGASTRIC PAIN (01/09/2010), FATIGUE (09/06/2009), GERD (12/06/2009), HOARSENESS (09/06/2009), HYPERLIPIDEMIA (10/18/2008), HYPERTENSION (10/18/2008), IBS (10/18/2008), MYALGIA (03/21/2009), Osteopenia (12/2018), PULMONARY SARCOIDOSIS  (10/18/2008), SINUSITIS- ACUTE-NOS (03/21/2009), and VOCAL CORD NODULE (09/06/2009).  B12 deficiency Lab Results  Component Value Date   VITAMINB12 1,300 (H) 07/25/2023   Stable, cont oral replacement - b12 1000 mcg qd   Bilateral impacted cerumen Resolved today,  to f/u any worsening symptoms or concerns  Essential hypertension BP Readings from Last 3 Encounters:  07/25/23 120/68  06/12/23 130/82  04/02/23 134/82   Stable, pt to continue medical treatment norvasc 10 every day, losartan 50 every day,    Hyperglycemia Lab Results  Component Value Date   HGBA1C 6.3 07/25/2023   Stable, pt to continue current medical treatment  - diet, wt control   Hyperlipidemia Lab Results  Component Value Date   LDLCALC 105 (H) 07/25/2023   Uncontrolled, for lower chol diet, for loewr chol diet, declines statin   Murmur For echo r/o AS  Vitamin D deficiency Last vitamin D Lab Results  Component Value Date   VD25OH 69.69 07/25/2023   Stable, cont oral replacement  Followup: Return in about 6 months (around 01/22/2024).  Oliver Barre, MD 07/29/2023 8:17 PM [facility]

## 2023-07-25 NOTE — Patient Instructions (Signed)
Your ears were irrigated today  Please continue all other medications as before, and refills have been done if requested.  Please have the pharmacy call with any other refills you may need.  Please continue your efforts at being more active, low cholesterol diet, and weight control..  Please keep your appointments with your specialists as you may have planned  You will be contacted regarding the referral for: Echocardiogram (heart ultrasound to check the heart muscle and valves)  Please go to the LAB at the blood drawing area for the tests to be done  You will be contacted by phone if any changes need to be made immediately.  Otherwise, you will receive a letter about your results with an explanation, but please check with MyChart first.  Please make an Appointment to return in 6 months, or sooner if needed

## 2023-07-29 ENCOUNTER — Encounter: Payer: Self-pay | Admitting: Internal Medicine

## 2023-07-29 DIAGNOSIS — R011 Cardiac murmur, unspecified: Secondary | ICD-10-CM | POA: Insufficient documentation

## 2023-07-29 DIAGNOSIS — E559 Vitamin D deficiency, unspecified: Secondary | ICD-10-CM | POA: Insufficient documentation

## 2023-07-29 NOTE — Assessment & Plan Note (Signed)
Lab Results  Component Value Date   HGBA1C 6.3 07/25/2023   Stable, pt to continue current medical treatment  - diet, wt control

## 2023-07-29 NOTE — Assessment & Plan Note (Signed)
Last vitamin D Lab Results  Component Value Date   VD25OH 69.69 07/25/2023   Stable, cont oral replacement

## 2023-07-29 NOTE — Assessment & Plan Note (Signed)
Lab Results  Component Value Date   LDLCALC 105 (H) 07/25/2023   Uncontrolled, for lower chol diet, for loewr chol diet, declines statin

## 2023-07-29 NOTE — Assessment & Plan Note (Signed)
BP Readings from Last 3 Encounters:  07/25/23 120/68  06/12/23 130/82  04/02/23 134/82   Stable, pt to continue medical treatment norvasc 10 every day, losartan 50 every day,

## 2023-07-29 NOTE — Assessment & Plan Note (Signed)
Resolved today,  to f/u any worsening symptoms or concerns

## 2023-07-29 NOTE — Assessment & Plan Note (Signed)
Lab Results  Component Value Date   VITAMINB12 1,300 (H) 07/25/2023   Stable, cont oral replacement - b12 1000 mcg qd

## 2023-07-29 NOTE — Assessment & Plan Note (Signed)
For echo - r/o AS

## 2023-08-15 ENCOUNTER — Ambulatory Visit (HOSPITAL_COMMUNITY): Payer: Medicare Other | Attending: Internal Medicine

## 2023-08-15 ENCOUNTER — Other Ambulatory Visit: Payer: Self-pay

## 2023-08-15 ENCOUNTER — Other Ambulatory Visit: Payer: Self-pay | Admitting: Internal Medicine

## 2023-08-15 DIAGNOSIS — R011 Cardiac murmur, unspecified: Secondary | ICD-10-CM | POA: Diagnosis not present

## 2023-08-15 LAB — ECHOCARDIOGRAM COMPLETE
Area-P 1/2: 2.72 cm2
P 1/2 time: 499 ms
S' Lateral: 2.6 cm

## 2023-08-16 ENCOUNTER — Encounter: Payer: Self-pay | Admitting: Internal Medicine

## 2023-08-22 ENCOUNTER — Other Ambulatory Visit: Payer: Self-pay

## 2023-08-22 ENCOUNTER — Other Ambulatory Visit: Payer: Self-pay | Admitting: Internal Medicine

## 2023-08-26 ENCOUNTER — Encounter: Payer: Self-pay | Admitting: Neurology

## 2023-08-26 ENCOUNTER — Ambulatory Visit (INDEPENDENT_AMBULATORY_CARE_PROVIDER_SITE_OTHER): Payer: Medicare Other | Admitting: Neurology

## 2023-08-26 VITALS — BP 134/77 | HR 99 | Ht 63.0 in | Wt 123.0 lb

## 2023-08-26 DIAGNOSIS — R202 Paresthesia of skin: Secondary | ICD-10-CM

## 2023-08-26 DIAGNOSIS — M5416 Radiculopathy, lumbar region: Secondary | ICD-10-CM | POA: Diagnosis not present

## 2023-08-26 MED ORDER — GABAPENTIN 300 MG PO CAPS: 600.0000 mg | ORAL_CAPSULE | Freq: Every day | ORAL | 1 refills | Status: DC

## 2023-08-26 NOTE — Progress Notes (Signed)
 Follow-up Visit   Date: 08/26/2023    Briana Schroeder MRN: [id number] DOB: October 18, 1945    Briana Schroeder is a 78 y.o. right-handed female with GERD, hypertension, and polyarthralgia returning to the clinic for follow-up of bilateral feet parethesias.  The patient was accompanied to the clinic by self.  IMPRESSION/PLAN: Bilateral feet paresthesias.  NCS/EMG did not show large fiber neuropathy.  It suggested possible bilateral L5 radiculopathy. No benefit with PT.   - Increase gabapentin to 600mg  at bedtime - Continue home exercises - If symptoms get worse, then consider MRI lumbar spine  Polyarthralgia followed by Dr. Deanne Coffer, rheumatologist.   Return to clinic in 6 months  --------------------------------------------- UPDATE 11/29/2022:  She is here for EDX.  She continues to have tingling involving both feet.  Prior NCS/EMG performed at [facility] showed findings consistent with right peroneal mononeuropathy, however these findings did match her clinical presentation.  She does not have foot weakness.  She denies low back pain.  UPDATE 02/19/2023:  Since her last visit, her feet tingling has improved.  She takes gabapentin 300mg  at bedtime which helps.  Her primary issue today is achy and throbbing left hip pain.  She denies weakness.  Pain is improved with tramadol, which is prescribed by her PCP.    UPDATE 08/26/2023:  She is here for 6 month follow-up visit.  She continues to have tingling in the feet, which is worse at night time.  She takes gabapentin 300mg  at bedtime which helps some.  No weakness.  She has a lot of generalized joint pain and is followed by rheumatology.    Medications:  Current Outpatient Medications on File Prior to Visit  Medication Sig Dispense Refill   amLODipine (NORVASC) 10 MG tablet Take 1 tablet by mouth once daily 90 tablet 3   ascorbic acid (VITAMIN C) 500 MG tablet Take 500 mg by mouth daily.     cetirizine (ZYRTEC) 10 MG tablet Take 1  tablet by mouth once daily 30 tablet 0   cyclobenzaprine (FLEXERIL) 5 MG tablet Take 1 tablet (5 mg total) by mouth at bedtime as needed for muscle spasms. 90 tablet 3   gabapentin (NEURONTIN) 300 MG capsule Take 1 capsule (300 mg total) by mouth at bedtime. 90 capsule 1   losartan (COZAAR) 50 MG tablet Take 1 tablet by mouth once daily 90 tablet 0   meloxicam (MOBIC) 15 MG tablet TAKE 1 TABLET BY MOUTH ONCE DAILY AS NEEDED 90 tablet 3   Methylcobalamin (B-12) 5000 MCG TBDP Take 5,000 mcg by mouth daily.     pantoprazole (PROTONIX) 40 MG tablet Take 1 tablet by mouth once daily 90 tablet 0   traMADol (ULTRAM) 50 MG tablet TAKE 1 TABLET BY MOUTH EVERY 6 HOURS AS NEEDED 60 tablet 2   VITAMIN D, CHOLECALCIFEROL, PO Take 1 tablet by mouth daily.     No current facility-administered medications on file prior to visit.    Allergies: No Known Allergies  Vital Signs:  BP 134/77   Pulse 99   Ht 5\' 3"  (1.6 m)   Wt 123 lb (55.8 kg)   SpO2 100%   BMI 21.79 kg/m    Neurological Exam: MENTAL STATUS including orientation to time, place, person, recent and remote memory, attention span and concentration, language, and fund of knowledge is normal.  Speech is not dysarthric.  CRANIAL NERVES:   Normal conjugate, extra-ocular eye movements in all directions of gaze.  No ptosis.  Face is symmetric.  MOTOR:  Motor strength is 5/5 in all extremities.  No atrophy, fasciculations or abnormal movements.  No pronator drift.  Tone is normal.    COORDINATION/GAIT:  Normal finger-to- nose-finger.  Intact rapid alternating movements bilaterally.  Gait slightly antalgic, unassisted, stable.  Data: NCS/EMG of the legs 11/29/2022: Chronic L5 radiculopathy affecting bilateral lower extremities, mild and worse on the right. There is no evidence of a large fiber sensorimotor polyneuropathy affecting the lower extremities.   Thank you for allowing me to participate in patient's care.  If I can answer any additional  questions, I would be pleased to do so.    Sincerely,    Aahan Marques K. Allena Katz, DO

## 2023-09-07 ENCOUNTER — Other Ambulatory Visit: Payer: Self-pay | Admitting: Internal Medicine

## 2023-09-09 ENCOUNTER — Other Ambulatory Visit: Payer: Self-pay

## 2023-09-17 ENCOUNTER — Other Ambulatory Visit: Payer: Self-pay

## 2023-09-17 ENCOUNTER — Other Ambulatory Visit: Payer: Self-pay | Admitting: Internal Medicine

## 2023-10-02 ENCOUNTER — Other Ambulatory Visit: Payer: Self-pay

## 2023-10-02 ENCOUNTER — Other Ambulatory Visit: Payer: Self-pay | Admitting: Internal Medicine

## 2023-10-09 DIAGNOSIS — H25813 Combined forms of age-related cataract, bilateral: Secondary | ICD-10-CM | POA: Diagnosis not present

## 2023-10-09 DIAGNOSIS — H04123 Dry eye syndrome of bilateral lacrimal glands: Secondary | ICD-10-CM | POA: Diagnosis not present

## 2023-10-12 ENCOUNTER — Other Ambulatory Visit: Payer: Self-pay | Admitting: Neurology

## 2023-10-14 ENCOUNTER — Other Ambulatory Visit: Payer: Self-pay | Admitting: Internal Medicine

## 2023-10-14 ENCOUNTER — Other Ambulatory Visit: Payer: Self-pay

## 2023-10-16 ENCOUNTER — Ambulatory Visit: Admitting: Internal Medicine

## 2023-10-16 ENCOUNTER — Encounter: Payer: Self-pay | Admitting: Internal Medicine

## 2023-10-16 VITALS — BP 128/72 | HR 70 | Temp 98.0°F | Ht 63.0 in | Wt 120.0 lb

## 2023-10-16 DIAGNOSIS — E7800 Pure hypercholesterolemia, unspecified: Secondary | ICD-10-CM

## 2023-10-16 DIAGNOSIS — G8929 Other chronic pain: Secondary | ICD-10-CM

## 2023-10-16 DIAGNOSIS — M25512 Pain in left shoulder: Secondary | ICD-10-CM

## 2023-10-16 DIAGNOSIS — M79605 Pain in left leg: Secondary | ICD-10-CM | POA: Diagnosis not present

## 2023-10-16 DIAGNOSIS — M25511 Pain in right shoulder: Secondary | ICD-10-CM

## 2023-10-16 DIAGNOSIS — M353 Polymyalgia rheumatica: Secondary | ICD-10-CM | POA: Insufficient documentation

## 2023-10-16 LAB — CBC WITH DIFFERENTIAL/PLATELET
Basophils Absolute: 0 10*3/uL (ref 0.0–0.1)
Basophils Relative: 0.9 % (ref 0.0–3.0)
Eosinophils Absolute: 0.3 10*3/uL (ref 0.0–0.7)
Eosinophils Relative: 5.5 % — ABNORMAL HIGH (ref 0.0–5.0)
HCT: 34.6 % — ABNORMAL LOW (ref 36.0–46.0)
Hemoglobin: 11.5 g/dL — ABNORMAL LOW (ref 12.0–15.0)
Lymphocytes Relative: 24.2 % (ref 12.0–46.0)
Lymphs Abs: 1.3 10*3/uL (ref 0.7–4.0)
MCHC: 33.1 g/dL (ref 30.0–36.0)
MCV: 92.1 fl (ref 78.0–100.0)
Monocytes Absolute: 0.6 10*3/uL (ref 0.1–1.0)
Monocytes Relative: 10.6 % (ref 3.0–12.0)
Neutro Abs: 3.1 10*3/uL (ref 1.4–7.7)
Neutrophils Relative %: 58.8 % (ref 43.0–77.0)
Platelets: 311 10*3/uL (ref 150.0–400.0)
RBC: 3.76 Mil/uL — ABNORMAL LOW (ref 3.87–5.11)
RDW: 14.6 % (ref 11.5–15.5)
WBC: 5.2 10*3/uL (ref 4.0–10.5)

## 2023-10-16 LAB — HEPATIC FUNCTION PANEL
ALT: 39 U/L — ABNORMAL HIGH (ref 0–35)
AST: 47 U/L — ABNORMAL HIGH (ref 0–37)
Albumin: 4 g/dL (ref 3.5–5.2)
Alkaline Phosphatase: 43 U/L (ref 39–117)
Bilirubin, Direct: 0.1 mg/dL (ref 0.0–0.3)
Total Bilirubin: 0.4 mg/dL (ref 0.2–1.2)
Total Protein: 7.3 g/dL (ref 6.0–8.3)

## 2023-10-16 LAB — BASIC METABOLIC PANEL WITH GFR
BUN: 17 mg/dL (ref 6–23)
CO2: 27 meq/L (ref 19–32)
Calcium: 9.7 mg/dL (ref 8.4–10.5)
Chloride: 104 meq/L (ref 96–112)
Creatinine, Ser: 0.66 mg/dL (ref 0.40–1.20)
GFR: 83.65 mL/min (ref >60.00)
Glucose, Bld: 102 mg/dL — ABNORMAL HIGH (ref 70–99)
Potassium: 4.3 meq/L (ref 3.5–5.1)
Sodium: 138 meq/L (ref 135–145)

## 2023-10-16 LAB — CK: Total CK: 987 U/L — ABNORMAL HIGH (ref 7–177)

## 2023-10-16 LAB — SEDIMENTATION RATE: Sed Rate: 37 mm/h — ABNORMAL HIGH (ref 0–30)

## 2023-10-16 LAB — C-REACTIVE PROTEIN: CRP: <1 mg/dL (ref 0.5–20.0)

## 2023-10-16 MED ORDER — ROSUVASTATIN CALCIUM 10 MG PO TABS: 10.0000 mg | ORAL_TABLET | Freq: Every day | ORAL | 3 refills | Status: AC

## 2023-10-16 MED ORDER — PREDNISONE 10 MG PO TABS: ORAL_TABLET | ORAL | 2 refills | Status: DC

## 2023-10-16 NOTE — Telephone Encounter (Unsigned)
 Copied from CRM [id number]. Topic: General - Call Back - No Documentation >> Oct 16, 2023 11:35 AM Shereese L wrote: Reason for ION:[id number] IS REQUEST A CALL BACK FOR THE MEDICAL ALERT BRACELET

## 2023-10-16 NOTE — Patient Instructions (Addendum)
 Please take all new medication as prescribed - the crestor 10 mg per day for cholesterol, and prednisone low dose  Please continue all other medications as before, and refills have been done if requested.  Please have the pharmacy call with any other refills you may need.  Please continue your efforts at being more active, low cholesterol diet, and weight control.  You are otherwise up to date with prevention measures today.  Please keep your appointments with your specialists as you may have planned  You will be contacted regarding the referral for: Dr Brunilda Capra - orthopedic, and Dr Bascom Lily - rheumatology  You will be contacted regarding the referral for: left leg circulation test for blood flow  Please go to the LAB at the blood drawing area for the tests to be done  You will be contacted by phone if any changes need to be made immediately.  Otherwise, you will receive a letter about your results with an explanation, but please check with MyChart first.

## 2023-10-16 NOTE — Progress Notes (Signed)
 Patient ID: Briana Schroeder, female   DOB: 1945/10/20, 78 y.o.   MRN: [id number]        Chief Complaint: follow up hld, left leg pain, bilateral shoulder pain, PMR       HPI:  Briana Schroeder is a 78 y.o. female here overall doing ok, but has several concerns, Pt denies chest pain, increased sob or doe, wheezing, orthopnea, PND, increased LE swelling, palpitations, dizziness or syncope, but has left leg pain with ambulation , better with rest  Also has worsening bilateral shoulder pain, hx of PMR and lost to f/u with rheum for several years.  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     Wt Readings from Last 3 Encounters:  10/16/23 120 lb (54.4 kg)  08/26/23 123 lb (55.8 kg)  07/25/23 121 lb (54.9 kg)   BP Readings from Last 3 Encounters:  10/16/23 128/72  08/26/23 134/77  07/25/23 120/68         Past Medical History:  Diagnosis Date   ALLERGIC RHINITIS 08/02/2009   Allergy    ANXIETY 10/18/2008   ARM PAIN, RIGHT 04/04/2009   Arthritis    BACK PAIN 08/02/2009   CHEST PAIN 12/05/2009   COLONIC POLYPS, HX OF 10/18/2008   DIZZINESS 12/05/2009   EPIGASTRIC PAIN 01/09/2010   FATIGUE 09/06/2009   GERD 12/06/2009   HOARSENESS 09/06/2009   HYPERLIPIDEMIA 10/18/2008   HYPERTENSION 10/18/2008   IBS 10/18/2008   MYALGIA 03/21/2009   Osteopenia 12/2018   T score -1.7 FRAX 12% / 2.5% table from prior DEXA 2018   PULMONARY SARCOIDOSIS 10/18/2008   SINUSITIS- ACUTE-NOS 03/21/2009   VOCAL CORD NODULE 09/06/2009   Past Surgical History:  Procedure Laterality Date   ABDOMINAL HYSTERECTOMY  08/1992   TAH-DR G FOR FIBROIDS AND MENORRHAGIA   COLONOSCOPY  11-24-2009   3 polyps, +TA and HPP   hx of vocal cord nodules     MUSCLE BIOPSY Right 08/23/2022   Procedure: RIGHT DELTOID MUSCLE BIOPSY;  Surgeon: Lockie Rima, MD;  Location: [facility];  Service: General;  Laterality: Right;   s/p right finger tendon surgury     UPPER GASTROINTESTINAL ENDOSCOPY   01-08-2010   normal    reports that she has never smoked. She has never used smokeless tobacco. She reports that she does not drink alcohol and does not use drugs. family history includes Breast cancer (age of onset: 45) in her mother; Diabetes in her mother; Hypertension in her mother and sister. No Known Allergies Current Outpatient Medications on File Prior to Visit  Medication Sig Dispense Refill   amLODipine  (NORVASC ) 10 MG tablet Take 1 tablet by mouth once daily 90 tablet 3   ascorbic acid (VITAMIN C) 500 MG tablet Take 500 mg by mouth daily.     cetirizine  (ZYRTEC ) 10 MG tablet Take 1 tablet by mouth once daily 30 tablet 0   cyclobenzaprine  (FLEXERIL ) 5 MG tablet Take 1 tablet (5 mg total) by mouth at bedtime as needed for muscle spasms. 90 tablet 3   gabapentin  (NEURONTIN ) 300 MG capsule Take 2 capsules (600 mg total) by mouth at bedtime. 180 capsule 1   losartan  (COZAAR ) 50 MG tablet Take 1 tablet by mouth once daily 90 tablet 0   meloxicam  (MOBIC ) 15 MG tablet TAKE 1 TABLET BY MOUTH ONCE DAILY AS NEEDED 90 tablet 3   Methylcobalamin (B-12) 5000 MCG TBDP Take 5,000 mcg by mouth daily.     pantoprazole  (  PROTONIX ) 40 MG tablet Take 1 tablet by mouth once daily 90 tablet 0   RESTASIS 0.05 % ophthalmic emulsion SMARTSIG:1 In Eye(s) Twice Daily     traMADol  (ULTRAM ) 50 MG tablet TAKE 1 TABLET BY MOUTH EVERY 6 HOURS AS NEEDED 60 tablet 2   VITAMIN D , CHOLECALCIFEROL, PO Take 1 tablet by mouth daily.     No current facility-administered medications on file prior to visit.        ROS:  All others reviewed and negative.  Objective        PE:  BP 128/72 (BP Location: Left Arm, Patient Position: Sitting, Cuff Size: Normal)   Pulse 70   Temp 98 F (36.7 C) (Oral)   Ht 5\' 3"  (1.6 m)   Wt 120 lb (54.4 kg)   SpO2 100%   BMI 21.26 kg/m                 Constitutional: Pt appears in NAD               HENT: Head: NCAT.                Right Ear: External ear normal.                 Left  Ear: External ear normal.                Eyes: . Pupils are equal, round, and reactive to light. Conjunctivae and EOM are normal               Nose: without d/c or deformity               Neck: Neck supple. Gross normal ROM               Cardiovascular: Normal rate and regular rhythm.                 Pulmonary/Chest: Effort normal and breath sounds without rales or wheezing.                Abd:  Soft, NT, ND, + BS, no organomegaly               Neurological: Pt is alert. At baseline orientation, motor grossly intact               Skin: Skin is warm. No rashes, no other new lesions, LE edema - none               Psychiatric: Pt behavior is normal without agitation   Micro: none  Cardiac tracings I have personally interpreted today:  none  Pertinent Radiological findings (summarize): none   Lab Results  Component Value Date   WBC 5.2 10/16/2023   HGB 11.5 (L) 10/16/2023   HCT 34.6 (L) 10/16/2023   PLT 311.0 10/16/2023   GLUCOSE 102 (H) 10/16/2023   CHOL 200 07/25/2023   TRIG 127.0 07/25/2023   HDL 69.70 07/25/2023   LDLDIRECT 83.3 11/17/2012   LDLCALC 105 (H) 07/25/2023   ALT 39 (H) 10/16/2023   AST 47 (H) 10/16/2023   NA 138 10/16/2023   K 4.3 10/16/2023   CL 104 10/16/2023   CREATININE 0.66 10/16/2023   BUN 17 10/16/2023   CO2 27 10/16/2023   TSH 0.93 07/25/2023   HGBA1C 6.3 07/25/2023   MICROALBUR 1.6 04/02/2023   Assessment/Plan:  Briana Schroeder is a 78 y.o. Black or African American [2] female with  has a past medical history  of ALLERGIC RHINITIS (08/02/2009), Allergy, ANXIETY (10/18/2008), ARM PAIN, RIGHT (04/04/2009), Arthritis, BACK PAIN (08/02/2009), CHEST PAIN (12/05/2009), COLONIC POLYPS, HX OF (10/18/2008), DIZZINESS (12/05/2009), EPIGASTRIC PAIN (01/09/2010), FATIGUE (09/06/2009), GERD (12/06/2009), HOARSENESS (09/06/2009), HYPERLIPIDEMIA (10/18/2008), HYPERTENSION (10/18/2008), IBS (10/18/2008), MYALGIA (03/21/2009), Osteopenia (12/2018), PULMONARY SARCOIDOSIS  (10/18/2008), SINUSITIS- ACUTE-NOS (03/21/2009), and VOCAL CORD NODULE (09/06/2009).  PMR (polymyalgia rheumatica) (HCC) For prednisone  10 mg every day, refer back Dr Bascom Lily rheum.    Chronic pain of both shoulders Worsening recent, has likely PMR but also reduced ROM left > right , now for ortho referral as well  Hyperlipidemia Lab Results  Component Value Date   LDLCALC 105 (H) 07/25/2023   Uncontrolled, pt to start crestor  10 mg qd   Left leg pain Hx suggestive of claudication - for arterial dopplers  Followup: Return if symptoms worsen or fail to improve.  Rosalia Colonel, MD 10/18/2023 6:13 PM [facility] - [facility]

## 2023-10-16 NOTE — Progress Notes (Signed)
 The test results show that your current treatment is OK, as the tests are stable.  Please continue the same plan.  There is no other need for change of treatment or further evaluation based on these results, at this time.  thanks

## 2023-10-18 ENCOUNTER — Encounter: Payer: Self-pay | Admitting: Internal Medicine

## 2023-10-18 DIAGNOSIS — M79605 Pain in left leg: Secondary | ICD-10-CM | POA: Insufficient documentation

## 2023-10-18 NOTE — Assessment & Plan Note (Signed)
 Hx suggestive of claudication - for arterial dopplers

## 2023-10-18 NOTE — Assessment & Plan Note (Signed)
 Lab Results  Component Value Date   LDLCALC 105 (H) 07/25/2023   Uncontrolled, pt to start crestor  10 mg qd

## 2023-10-18 NOTE — Assessment & Plan Note (Signed)
 Worsening recent, has likely PMR but also reduced ROM left > right , now for ortho referral as well

## 2023-10-18 NOTE — Assessment & Plan Note (Signed)
 For prednisone  10 mg every day, refer back Dr Bascom Lily rheum.

## 2023-10-20 ENCOUNTER — Other Ambulatory Visit: Payer: Self-pay | Admitting: Neurology

## 2023-10-23 ENCOUNTER — Other Ambulatory Visit: Payer: Self-pay | Admitting: Neurology

## 2023-10-23 NOTE — Telephone Encounter (Signed)
 Please refill gabapentin  currently on sched

## 2023-10-25 ENCOUNTER — Other Ambulatory Visit: Payer: Self-pay | Admitting: Internal Medicine

## 2023-11-10 ENCOUNTER — Other Ambulatory Visit: Payer: Self-pay | Admitting: Internal Medicine

## 2023-11-11 ENCOUNTER — Other Ambulatory Visit: Payer: Self-pay

## 2023-11-14 ENCOUNTER — Ambulatory Visit (HOSPITAL_COMMUNITY)
Admission: RE | Admit: 2023-11-14 | Discharge: 2023-11-14 | Disposition: A | Discharge status: Home / Self Care | Source: Ambulatory Visit | Attending: Internal Medicine | Admitting: Internal Medicine

## 2023-11-14 DIAGNOSIS — M79605 Pain in left leg: Secondary | ICD-10-CM | POA: Diagnosis present

## 2023-11-14 LAB — VAS US ABI WITH/WO TBI
Left ABI: 1.11
Right ABI: 1.12

## 2023-11-16 ENCOUNTER — Other Ambulatory Visit: Payer: Self-pay | Admitting: Internal Medicine

## 2023-11-19 ENCOUNTER — Telehealth: Payer: Self-pay | Admitting: Internal Medicine

## 2023-11-19 NOTE — Telephone Encounter (Signed)
 Copied from CRM [id number]. Topic: Clinical - Medical Advice >> Nov 19, 2023  3:58 PM Melissa C wrote: Reason for CRM: patient calling again regarding obtaining a medical alert for herself. Please advise with patient

## 2023-11-19 NOTE — Telephone Encounter (Signed)
 That is not a service that we are involved with; this is normally done by the patient such as [employer].

## 2023-11-20 NOTE — Telephone Encounter (Signed)
 Called and left voice mail

## 2023-11-26 ENCOUNTER — Telehealth: Payer: Self-pay | Admitting: Neurology

## 2023-11-26 DIAGNOSIS — Z79899 Other long term (current) drug therapy: Secondary | ICD-10-CM | POA: Diagnosis not present

## 2023-11-26 DIAGNOSIS — R202 Paresthesia of skin: Secondary | ICD-10-CM | POA: Diagnosis not present

## 2023-11-26 DIAGNOSIS — M1990 Unspecified osteoarthritis, unspecified site: Secondary | ICD-10-CM | POA: Diagnosis not present

## 2023-11-26 DIAGNOSIS — R748 Abnormal levels of other serum enzymes: Secondary | ICD-10-CM | POA: Diagnosis not present

## 2023-11-26 DIAGNOSIS — M353 Polymyalgia rheumatica: Secondary | ICD-10-CM | POA: Diagnosis not present

## 2023-11-26 DIAGNOSIS — R768 Other specified abnormal immunological findings in serum: Secondary | ICD-10-CM | POA: Diagnosis not present

## 2023-11-26 DIAGNOSIS — G5731 Lesion of lateral popliteal nerve, right lower limb: Secondary | ICD-10-CM | POA: Diagnosis not present

## 2023-11-26 NOTE — Telephone Encounter (Signed)
 Pt. Still feeling tingling feet after taking medication, please call back

## 2023-11-28 MED ORDER — GABAPENTIN 300 MG PO CAPS: 900.0000 mg | ORAL_CAPSULE | Freq: Every day | ORAL | 1 refills | Status: DC

## 2023-11-28 NOTE — Telephone Encounter (Signed)
 There is still room to increase her gabapentin  to 900mg  at bedtime (daily maximum dose is 3200mg ).  I would try to go higher on the gabapentin  before adding another medication.

## 2023-11-28 NOTE — Telephone Encounter (Signed)
 Called patient and she informed me that she is taking Gabapentin  600 mg at bedtime. Patient has also been doing her home exercises. Patient wanted to let Dr. Lydia Sams know that in the day time she can feel her toes.   Patient wanted to know if maybe there is another medication she can take?   Patient aware I will contact her back once I receive Dr.Patel's response.

## 2023-11-28 NOTE — Telephone Encounter (Signed)
 Called patient and informed her of Dr. Basilio Both recommendations below. Patient would like to give the Gabapentin  900 mg at bedtime a try. Patient aware an updated Rx has been sent in to her [facility] pharmacy. Patient had no further questions or concerns.

## 2023-12-09 ENCOUNTER — Other Ambulatory Visit: Payer: Self-pay

## 2023-12-09 ENCOUNTER — Other Ambulatory Visit: Payer: Self-pay | Admitting: Internal Medicine

## 2023-12-13 ENCOUNTER — Ambulatory Visit (INDEPENDENT_AMBULATORY_CARE_PROVIDER_SITE_OTHER)

## 2023-12-13 ENCOUNTER — Ambulatory Visit: Payer: Self-pay

## 2023-12-13 ENCOUNTER — Encounter (HOSPITAL_COMMUNITY): Payer: Self-pay

## 2023-12-13 ENCOUNTER — Ambulatory Visit (HOSPITAL_COMMUNITY)
Admission: EM | Admit: 2023-12-13 | Discharge: 2023-12-13 | Disposition: A | Discharge status: Home / Self Care | Attending: Physician Assistant | Admitting: Physician Assistant

## 2023-12-13 DIAGNOSIS — M19031 Primary osteoarthritis, right wrist: Secondary | ICD-10-CM | POA: Diagnosis not present

## 2023-12-13 DIAGNOSIS — M1811 Unilateral primary osteoarthritis of first carpometacarpal joint, right hand: Secondary | ICD-10-CM | POA: Diagnosis not present

## 2023-12-13 DIAGNOSIS — M25561 Pain in right knee: Secondary | ICD-10-CM | POA: Diagnosis not present

## 2023-12-13 DIAGNOSIS — H16143 Punctate keratitis, bilateral: Secondary | ICD-10-CM | POA: Diagnosis not present

## 2023-12-13 DIAGNOSIS — M25531 Pain in right wrist: Secondary | ICD-10-CM

## 2023-12-13 DIAGNOSIS — M85861 Other specified disorders of bone density and structure, right lower leg: Secondary | ICD-10-CM | POA: Diagnosis not present

## 2023-12-13 NOTE — Telephone Encounter (Signed)
 FYI Only or Action Required?: Action required by provider  Patient was last seen in primary care on 10/16/2023 by Roslyn Coombe, MD. Called Nurse Triage reporting Fall. Symptoms began yesterday. Interventions attempted: Rest, hydration, or home remedies. Symptoms are: unchanged.  Triage Disposition: See HCP Within 4 Hours (Or PCP Triage)  Patient/caregiver understands and will follow disposition?: YesCopied from CRM [id number]. Topic: Clinical - Red Word Triage >> Dec 13, 2023  8:04 AM Precious C wrote: [facility] that prompted transfer to Nurse Triage: Pt called in because of a FALL that happened yesterday evening, says her risk is hurting and pain down her leg and skinned here knee as well Pt says tylenol  for the pain about 30 minutes ago. Pain is on the scale of 8/10. Reason for Disposition  [1] MODERATE weakness (i.e., interferes with work, school, normal activities) AND [2] new-onset or worsening  Answer Assessment - Initial Assessment Questions 1. MECHANISM: How did the fall happen?     Just walking 2. DOMESTIC VIOLENCE AND ELDER ABUSE SCREENING: Did you fall because someone pushed you or tried to hurt you? If Yes, ask: Are you safe now?     no 3. ONSET: When did the fall happen? (e.g., minutes, hours, or days ago)     yesterday 4. LOCATION: What part of the body hit the ground? (e.g., back, buttocks, head, hips, knees, hands, head, stomach)     Right knee/arm  5. INJURY: Did you hurt (injure) yourself when you fell? If Yes, ask: What did you injure? Tell me more about this? (e.g., body area; type of injury; pain severity)     yes 6. PAIN: Is there any pain? If Yes, ask: How bad is the pain? (e.g., Scale 1-10; or mild,  moderate, severe)   - NONE (0): No pain   - MILD (1-3): Doesn't interfere with normal activities    - MODERATE (4-7): Interferes with normal activities or awakens from sleep    - SEVERE (8-10): Excruciating pain, unable to do any normal activities       8-9 7. SIZE: For cuts, bruises, or swelling, ask: How large is it? (e.g., inches or centimeters)      Scrape on knee, knot on right arm, right leg looks swollen around knee  9. OTHER SYMPTOMS: Do you have any other symptoms? (e.g., dizziness, fever, weakness; new onset or worsening).      denies 10. CAUSE: What do you think caused the fall (or falling)? (e.g., tripped, dizzy spell)       Not sure     Pt just fell walking. Leg just gave away. Pt able to get herself up. Pt denies hitting head. Pt is walking to get soreness less. The more walking is done, the less the pain is. No office appt today. RN advised pt go to UC and call of office f/u for next week.  Protocols used: Falls and [facility]

## 2023-12-13 NOTE — ED Triage Notes (Signed)
 Pt states fell on concrete yesterday. C/o pain to rt wrist and rt knee. States took tylenol  with relief.

## 2023-12-13 NOTE — Discharge Instructions (Signed)
 We will call you later today if they see any concerns on the wrist x-ray.  The radiologist did not see any fractures or concerns on your knee x-ray.  We recommend following up with your primary care provider within 1 week if symptoms are not improving.  We recommend rest, ice, compression, and elevation to help with your symptoms.  You can continue to take Tylenol  as needed for pain relief.  Please return or go to the emergency department if you have significant worsening of symptoms, loss of sensation, pain out of proportion, are unable to walk, or if you have any other concerns.

## 2023-12-13 NOTE — ED Provider Notes (Signed)
 [facility]    CSN: [id number] Arrival date & time: 12/13/23  4098      History   Chief Complaint Chief Complaint  Patient presents with   Fall    HPI Briana Schroeder is a 78 y.o. female.   Patient is a 78 year old female who presents to the urgent care today with concerns of right wrist and knee pain.  She reports that last night, she fell and landed on the ground.  She denies hitting her head and has not had any neurologic symptoms including vomiting, changes in vision, slurred speech, facial drooping, headache, or other concerns.  She tried putting some heat on both of those areas.  She took some Tylenol  this morning which helped with the pain.  She denies any loss of sensation, decreased range of motion, pain out of proportion, fever, or other concerns at this time.    Past Medical History:  Diagnosis Date   ALLERGIC RHINITIS 08/02/2009   Allergy    ANXIETY 10/18/2008   ARM PAIN, RIGHT 04/04/2009   Arthritis    BACK PAIN 08/02/2009   CHEST PAIN 12/05/2009   COLONIC POLYPS, HX OF 10/18/2008   DIZZINESS 12/05/2009   EPIGASTRIC PAIN 01/09/2010   FATIGUE 09/06/2009   GERD 12/06/2009   HOARSENESS 09/06/2009   HYPERLIPIDEMIA 10/18/2008   HYPERTENSION 10/18/2008   IBS 10/18/2008   MYALGIA 03/21/2009   Osteopenia 12/2018   T score -1.7 FRAX 12% / 2.5% table from prior DEXA 2018   PULMONARY SARCOIDOSIS 10/18/2008   SINUSITIS- ACUTE-NOS 03/21/2009   VOCAL CORD NODULE 09/06/2009    Patient Active Problem List   Diagnosis Date Noted   Left leg pain 10/18/2023   PMR (polymyalgia rheumatica) (HCC) 10/16/2023   Murmur 07/29/2023   Vitamin D  deficiency 07/29/2023   Bilateral impacted cerumen 07/25/2023   Rupture of left biceps tendon 06/13/2023   Rest pain of both lower extremities due to atherosclerosis (HCC) 06/13/2023   Right ear impacted cerumen 01/15/2023   Abnormal LFTs 01/15/2023   Polymyalgia (HCC) 09/01/2022   Upper extremity weakness 07/30/2022    Chest pain 04/28/2022   Chronic pain of both shoulders 04/28/2022   Anemia 04/28/2022   B12 deficiency 04/28/2022   Pain and swelling of left lower leg 04/28/2022   Arthritis 03/31/2022   Muscle cramps 01/26/2022   Chest pain, pleuritic 12/27/2021   RUQ pain 10/02/2021   Insomnia 08/09/2021   Left lumbar radiculopathy 05/12/2021   Heart murmur 05/12/2021   Pruritic rash 01/28/2021   Hives 08/22/2020   Traumatic incomplete tear of right rotator cuff 07/26/2020   Aortic atherosclerosis (HCC) 04/07/2020   Right shoulder pain 04/07/2020   Lipoma of right thigh 10/08/2019   Foot cramps 10/08/2019   Rectocele 09/29/2019   Weight loss 09/05/2018   Hyperglycemia 06/17/2018   Left sided sciatica 06/27/2017   Left-sided chest pain 05/15/2017   Radiculitis of left cervical region 10/10/2016   Abnormal auditory perception of left ear 03/08/2016   Hearing loss in right ear 12/15/2014   Elevated CK 12/02/2014   Bilateral hearing loss 05/21/2013   Hypokalemia 11/17/2012   Paresthesia 05/19/2012   Abnormal LFTs (liver function tests) 05/19/2012   Hearing loss, bilateral 05/19/2012   Preventative health care 05/15/2011   Left shoulder pain 05/15/2011   Osteopenia    Fibroid    Hearing loss sensory, bilateral 02/27/2011   EPIGASTRIC PAIN 01/09/2010   GERD 12/06/2009   DIZZINESS 12/05/2009   Hyperlipidemia 09/06/2009   FATIGUE 09/06/2009  HOARSENESS 09/06/2009   Allergic rhinitis 08/02/2009   PULMONARY SARCOIDOSIS 10/18/2008   Anxiety state 10/18/2008   Essential hypertension 10/18/2008   IBS 10/18/2008   History of colonic polyps 10/18/2008    Past Surgical History:  Procedure Laterality Date   ABDOMINAL HYSTERECTOMY  08/1992   TAH-DR G FOR FIBROIDS AND MENORRHAGIA   COLONOSCOPY  11-24-2009   3 polyps, +TA and HPP   hx of vocal cord nodules     MUSCLE BIOPSY Right 08/23/2022   Procedure: RIGHT DELTOID MUSCLE BIOPSY;  Surgeon: Lockie Rima, MD;  Location: [facility];  Service:  General;  Laterality: Right;   s/p right finger tendon surgury     UPPER GASTROINTESTINAL ENDOSCOPY  01-08-2010   normal    OB History     Gravida  2   Para  2   Term      Preterm      AB      Living  2      SAB      IAB      Ectopic      Multiple      Live Births               Home Medications    Prior to Admission medications   Medication Sig Start Date End Date Taking? Authorizing Provider  amLODipine  (NORVASC ) 10 MG tablet Take 1 tablet by mouth once daily 03/26/23   Roslyn Coombe, MD  ascorbic acid (VITAMIN C) 500 MG tablet Take 500 mg by mouth daily.    [provider]  cetirizine  (ZYRTEC ) 10 MG tablet Take 1 tablet by mouth once daily 12/09/23   Roslyn Coombe, MD  cyclobenzaprine  (FLEXERIL ) 5 MG tablet Take 1 tablet (5 mg total) by mouth at bedtime as needed for muscle spasms. 01/26/22   Roslyn Coombe, MD  gabapentin  (NEURONTIN ) 300 MG capsule Take 3 capsules (900 mg total) by mouth at bedtime. 11/28/23   Patel, Donika K, DO  losartan  (COZAAR ) 50 MG tablet Take 1 tablet by mouth once daily 11/18/23   Roslyn Coombe, MD  meloxicam  (MOBIC ) 15 MG tablet TAKE 1 TABLET BY MOUTH ONCE DAILY AS NEEDED 09/09/23   Roslyn Coombe, MD  Methylcobalamin (B-12) 5000 MCG TBDP Take 5,000 mcg by mouth daily.    [provider]  pantoprazole  (PROTONIX ) 40 MG tablet Take 1 tablet by mouth once daily 10/02/23   Roslyn Coombe, MD  predniSONE  (DELTASONE ) 10 MG tablet 1 tab by mouth once daily 10/16/23   John, James W, MD  RESTASIS 0.05 % ophthalmic emulsion SMARTSIG:1 In Eye(s) Twice Daily 10/09/23   [provider]  rosuvastatin  (CRESTOR ) 10 MG tablet Take 1 tablet (10 mg total) by mouth daily. 10/16/23   Roslyn Coombe, MD  traMADol  (ULTRAM ) 50 MG tablet TAKE 1 TABLET BY MOUTH EVERY 6 HOURS AS NEEDED 10/28/23   Roslyn Coombe, MD  VITAMIN D , CHOLECALCIFEROL, PO Take 1 tablet by mouth daily.    [provider]    Family History Family History  Problem  Relation Age of Onset   Diabetes Mother    Hypertension Mother    Breast cancer Mother 78   Hypertension Sister    Colon cancer Neg Hx    Colon polyps Neg Hx    Rectal cancer Neg Hx    Stomach cancer Neg Hx    Esophageal cancer Neg Hx     Social History Social History   Tobacco  Use   Smoking status: Never   Smokeless tobacco: Never  Vaping Use   Vaping status: Never Used  Substance Use Topics   Alcohol use: No    Alcohol/week: 0.0 standard drinks of alcohol   Drug use: No     Allergies   Patient has no known allergies.   Review of Systems Review of Systems See HPI for open ROS.  Physical Exam Triage Vital Signs ED Triage Vitals  Encounter Vitals Group     BP 12/13/23 0954 115/87     Girls Systolic BP Percentile --      Girls Diastolic BP Percentile --      Boys Systolic BP Percentile --      Boys Diastolic BP Percentile --      Pulse Rate 12/13/23 0954 73     Resp 12/13/23 0954 16     Temp 12/13/23 0954 98.2 F (36.8 C)     Temp Source 12/13/23 0954 Oral     SpO2 12/13/23 0954 97 %     Weight --      Height --      Head Circumference --      Peak Flow --      Pain Score 12/13/23 0955 2     Pain Loc --      Pain Education --      Exclude from Growth Chart --    No data found.  Updated Vital Signs BP 115/87 (BP Location: Right Arm)   Pulse 73   Temp 98.2 F (36.8 C) (Oral)   Resp 16   SpO2 97%   Visual Acuity Right Eye Distance:   Left Eye Distance:   Bilateral Distance:    Right Eye Near:   Left Eye Near:    Bilateral Near:     Physical Exam General: Alert and oriented, well-developed/well-nourished, calm, cooperative, no acute distress HEENT: Normocephalic atraumatic, moist mucous membranes, no scleral icterus, trachea midline Lungs: Speaking full sentences, non-labored respirations, no distress Heart: Regular rate and rhythm Abdomen:  Soft, nondistended Musculoskeletal: Full range of motion in the right knee but pain with knee  flexion, pain to palpation of the right knee, pain to palpation of the right distal wrist over the distal ulna, full and equal grip strength bilaterally Pulses: 2+ radial/pedal bilaterally Neurologic: Awake, A&O x4, gait normal, full sensation to light touch in all extremities Integumentary: Warm, dry, normal for ethnicity, intact, no rash Psychiatric: Appropriate mood & affect  UC Treatments / Results  Labs (all labs ordered are listed, but only abnormal results are displayed) Labs Reviewed - No data to display  EKG   Radiology DG Wrist Complete Right Result Date: 12/13/2023 CLINICAL DATA:  Status post fall with wrist pain EXAM: RIGHT WRIST - COMPLETE 4 VIEW COMPARISON:  None Available. FINDINGS: Cortical irregularity and ill-defined sclerosis across the distal radial metaphysis. No acute dislocation. Degenerative changes of the wrist and thumb carpometacarpal joint. Soft tissues are unremarkable. IMPRESSION: Cortical irregularity and ill-defined sclerosis across the distal radial metaphysis, suspicious for a nondisplaced fracture. Electronically Signed   By: Limin  Xu M.D.   On: 12/13/2023 11:02   DG Knee Complete 4 Views Right Result Date: 12/13/2023 CLINICAL DATA:  Pain after fall EXAM: RIGHT KNEE - COMPLETE 4 VIEW COMPARISON:  X-ray 10/26/2022 FINDINGS: Osteopenia. No fracture or dislocation. Preserved joint spaces. No joint effusion lateral view. Hyperostosis along the patella. IMPRESSION: Osteopenia.  No acute osseous abnormality. Electronically Signed   By: Otho Blitz.D.  On: 12/13/2023 10:50    Procedures Procedures (including critical care time)  Medications Ordered in UC Medications - No data to display  Initial Impression / Assessment and Plan / UC Course  I have reviewed the triage vital signs and the nursing notes.  Pertinent labs & imaging results that were available during my care of the patient were reviewed by me and considered in my medical decision making (see  chart for details).     Presents with right wrist and knee pain.  Differential diagnosis includes: Sprain, fracture, dislocation, contusion, hematoma, septic arthritis, gout, tendon injury, meniscal injury, including other diagnoses.  History obtained from: Patient.  Plan at this time/rationale: X-ray of the right wrist and right knee.   All ordered tests including imaging and labs were independently reviewed and interpreted by myself. Notable findings: No fractures of the knee.  Small area of concern on the distal radius.  Plan: Patient presents with right wrist and knee pain.  X-rays obtained and radiologist reports no fractures or concerns of the knee on x-ray but cortical irregularity and ill-defined sclerosis across the distal radial metaphysis, suspicious for a nondisplaced fracture.  This finding was not where patient was having the pain and the pain was most prominent over the distal ulna.  However, patient was given a wrist brace for further support and treatment. Discussed RICE measures (rest, ice, compression, and elevation).  Doubt more concerning injury such as tibial plateau fracture or compartment syndrome. Recommended Tylenol  for pain relief as needed. We discussed return precautions, symptomatic treatment, and follow up with primary care doctor within 1 week for further evaluation, and the patient demonstrated understanding and agreement with this plan.  Disposition: Stable to discharge home.   All questions answered to the best of this examiner's ability. Advised to f/u with PCP for further eval and/or reassessment. Patient/parent agrees to plan.  An appropriate evaluation has been performed, and in my medical judgment there is currently no evidence of an immediate life-threatening or surgical condition. Discharge is therefore indicated at this time.  This document was created using the aid of voice recognition Scientist, clinical (histocompatibility and immunogenetics).  Final Clinical Impressions(s) / UC  Diagnoses   Final diagnoses:  Right wrist pain  Acute pain of right knee     Discharge Instructions      We will call you later today if they see any concerns on the wrist x-ray.  The radiologist did not see any fractures or concerns on your knee x-ray.  We recommend following up with your primary care provider within 1 week if symptoms are not improving.  We recommend rest, ice, compression, and elevation to help with your symptoms.  You can continue to take Tylenol  as needed for pain relief.  Please return or go to the emergency department if you have significant worsening of symptoms, loss of sensation, pain out of proportion, are unable to walk, or if you have any other concerns.    ED Prescriptions   None    PDMP not reviewed this encounter.   Edna Gouty, PA-C 12/13/23 1209

## 2023-12-16 ENCOUNTER — Telehealth: Payer: Self-pay | Admitting: Internal Medicine

## 2023-12-16 ENCOUNTER — Telehealth: Payer: Self-pay | Admitting: Neurology

## 2023-12-16 DIAGNOSIS — M25561 Pain in right knee: Secondary | ICD-10-CM

## 2023-12-16 NOTE — Telephone Encounter (Signed)
Ok this referral is done, thanks

## 2023-12-16 NOTE — Telephone Encounter (Signed)
 Pt called in and left message with after hours service. She is experiencing toe pain. Medication is not working.

## 2023-12-16 NOTE — Telephone Encounter (Signed)
 Copied from CRM [id number]. Topic: Referral - Request for Referral >> Dec 16, 2023  9:45 AM Lajean Pike wrote: Did the patient discuss referral with their provider in the last year? Yes (If No - schedule appointment) (If Yes - send message)  Appointment offered?   Type of order/referral and detailed reason for visit: Patient stated that she received a referral to a sports medicine doctor and she would prefer to receive a referral to an Orthopedic physician. She said the notes from her Urgent care appointment should be in the chart for review.   Preference of office, provider, location:   If referral order, have you been seen by this specialty before?  (If Yes, this issue or another issue? When? Where?  Can we respond through MyChart? Yes or phone call

## 2023-12-17 NOTE — Telephone Encounter (Signed)
 Called patient and she has been taking Gabapentin  900 mg at bedtime since May. Patient reports that her toe is tingling and she even feels it when she is standing up and moving. Tingling has been bothersome. Patient aware I will send her message to Dr. Lydia Sams and give her a call back.

## 2023-12-17 NOTE — Telephone Encounter (Signed)
 Please offer her follow-up on 6/24 to discuss medication options. Thanks.

## 2023-12-24 ENCOUNTER — Ambulatory Visit (INDEPENDENT_AMBULATORY_CARE_PROVIDER_SITE_OTHER): Admitting: Neurology

## 2023-12-24 ENCOUNTER — Encounter: Payer: Self-pay | Admitting: Neurology

## 2023-12-24 VITALS — BP 127/70 | HR 64 | Ht 63.0 in | Wt 128.0 lb

## 2023-12-24 DIAGNOSIS — R202 Paresthesia of skin: Secondary | ICD-10-CM | POA: Diagnosis not present

## 2023-12-24 DIAGNOSIS — M48061 Spinal stenosis, lumbar region without neurogenic claudication: Secondary | ICD-10-CM

## 2023-12-24 MED ORDER — DULOXETINE HCL 30 MG PO CPEP: 30.0000 mg | ORAL_CAPSULE | Freq: Every day | ORAL | 3 refills | Status: DC

## 2023-12-24 NOTE — Progress Notes (Signed)
 Follow-up Visit   Date: 12/24/2023    EDLA PARA MRN: [id number] DOB: February 18, 1946    Briana Schroeder is a 78 y.o. right-handed female with GERD, hypertension, and polyarthralgia returning to the clinic for follow-up of bilateral feet parethesias.  The patient was accompanied to the clinic by self.  IMPRESSION/PLAN: Bilateral painful feet paresthesias.  NCS/EMG did not show large fiber neuropathy.  It suggested possible bilateral L5 radiculopathy. No benefit with PT.  MRI lumbar spine shows severe spinal canal stenosis at L4-5. She does not have symptoms of neurogenic claudication.  - Previously tried: gabapentin  900mg /d (no improvement) - Start duloxetine 30mg  at bedtime  - Discussed skin biopsy to evaluate for small fiber neuropathy, which was declined  Polyarthralgia followed by Dr. Curt, rheumatologist.   Return to clinic in 6 months  --------------------------------------------- UPDATE 11/29/2022:  She is here for EDX.  She continues to have tingling involving both feet.  Prior NCS/EMG performed at [facility] showed findings consistent with right peroneal mononeuropathy, however these findings did match her clinical presentation.  She does not have foot weakness.  She denies low back pain.  UPDATE 02/19/2023:  Since her last visit, her feet tingling has improved.  She takes gabapentin  300mg  at bedtime which helps.  Her primary issue today is achy and throbbing left hip pain.  She denies weakness.  Pain is improved with tramadol , which is prescribed by her PCP.    UPDATE 08/26/2023:  She is here for 6 month follow-up visit.  She continues to have tingling in the feet, which is worse at night time.  She takes gabapentin  300mg  at bedtime which helps some.  No weakness.  She has a lot of generalized joint pain and is followed by rheumatology.    UPDATE 12/24/2023:  She is here for sooner follow-up visit because of worsening bilateral toe tingling, which makes it difficult  for her to sleep at night.  She is taking gabapentin  900mg  without any relief and would like to try something different.  MRI lumbar spine from 2024 shows spinal canal stenosis at L4-5. She denies leg heaviness, difficulty with walking, weakness, or low back pain.   Medications:  Current Outpatient Medications on File Prior to Visit  Medication Sig Dispense Refill   amLODipine  (NORVASC ) 10 MG tablet Take 1 tablet by mouth once daily 90 tablet 3   ascorbic acid (VITAMIN C) 500 MG tablet Take 500 mg by mouth daily.     cetirizine  (ZYRTEC ) 10 MG tablet Take 1 tablet by mouth once daily 30 tablet 0   cyclobenzaprine  (FLEXERIL ) 5 MG tablet Take 1 tablet (5 mg total) by mouth at bedtime as needed for muscle spasms. 90 tablet 3   gabapentin  (NEURONTIN ) 300 MG capsule Take 3 capsules (900 mg total) by mouth at bedtime. 180 capsule 1   losartan  (COZAAR ) 50 MG tablet Take 1 tablet by mouth once daily 90 tablet 2   meloxicam  (MOBIC ) 15 MG tablet TAKE 1 TABLET BY MOUTH ONCE DAILY AS NEEDED 90 tablet 3   Methylcobalamin (B-12) 5000 MCG TBDP Take 5,000 mcg by mouth daily.     pantoprazole  (PROTONIX ) 40 MG tablet Take 1 tablet by mouth once daily 90 tablet 0   RESTASIS 0.05 % ophthalmic emulsion SMARTSIG:1 In Eye(s) Twice Daily     rosuvastatin  (CRESTOR ) 10 MG tablet Take 1 tablet (10 mg total) by mouth daily. 90 tablet 3   traMADol  (ULTRAM ) 50 MG tablet TAKE 1 TABLET BY MOUTH EVERY 6  HOURS AS NEEDED 60 tablet 2   VITAMIN D , CHOLECALCIFEROL, PO Take 1 tablet by mouth daily.     predniSONE  (DELTASONE ) 10 MG tablet 1 tab by mouth once daily (Patient not taking: Reported on 12/24/2023) 30 tablet 2   No current facility-administered medications on file prior to visit.    Allergies: No Known Allergies  Vital Signs:  BP 127/70   Pulse 64   Ht 5' 3 (1.6 m)   Wt 128 lb (58.1 kg)   SpO2 99%   BMI 22.67 kg/m    Neurological Exam: MENTAL STATUS including orientation to time, place, person, recent and remote  memory, attention span and concentration, language, and fund of knowledge is normal.  Speech is not dysarthric.  CRANIAL NERVES:   Normal conjugate, extra-ocular eye movements in all directions of gaze.  No ptosis.  Face is symmetric.   MOTOR:  Motor strength is 5/5 in all extremities.  No atrophy, fasciculations or abnormal movements.  No pronator drift.  Tone is normal.    REFLEXES:  Reflexes are 2+/4 throughout, except 3+/4 at the knees.   SENSATION:  Intact to vibration throughout.   COORDINATION/GAIT:  Normal finger-to- nose-finger.  Intact rapid alternating movements bilaterally.  Gait slightly antalgic, unassisted, stable.  Data: NCS/EMG of the legs 11/29/2022: Chronic L5 radiculopathy affecting bilateral lower extremities, mild and worse on the right. There is no evidence of a large fiber sensorimotor polyneuropathy affecting the lower extremities.  MRI lumbar spine 05/15/2023: 1. L3-4: Advanced bilateral facet arthritis with degenerative anterolisthesis of 3 mm. Mild canal narrowing but no visible neural compression. The facet arthritis could be symptomatic. This has worsened since 2018. 2. L4-5: Advanced bilateral facet arthropathy with anterolisthesis of 9 mm. Bulging of the disc. Facet hypertrophy is more severe on the left than the right. There is multifactorial spinal stenosis at this level that could cause neural compression on either or both sides, particularly on the left. The facet arthritis could be painful. This has also worsened since 2018.  Thank you for allowing me to participate in patient's care.  If I can answer any additional questions, I would be pleased to do so.    Sincerely,    Ednamae Schiano K. Tobie, DO

## 2023-12-24 NOTE — Patient Instructions (Signed)
 Start duloxetine 30mg  at bedtime  Reduce gabapentin  by 1 tablet every 3 days, then stop.

## 2023-12-25 ENCOUNTER — Ambulatory Visit (INDEPENDENT_AMBULATORY_CARE_PROVIDER_SITE_OTHER): Admitting: Orthopaedic Surgery

## 2023-12-25 DIAGNOSIS — G8929 Other chronic pain: Secondary | ICD-10-CM

## 2023-12-25 DIAGNOSIS — M25561 Pain in right knee: Secondary | ICD-10-CM

## 2023-12-25 MED ORDER — LIDOCAINE HCL 1 % IJ SOLN
2.0000 mL | INTRAMUSCULAR | Status: AC | PRN
  Administered 2023-12-25: 2 mL

## 2023-12-25 MED ORDER — METHYLPREDNISOLONE ACETATE 40 MG/ML IJ SUSP
40.0000 mg | INTRAMUSCULAR | Status: AC | PRN
  Administered 2023-12-25: 40 mg via INTRA_ARTICULAR

## 2023-12-25 MED ORDER — BUPIVACAINE HCL 0.5 % IJ SOLN
2.0000 mL | INTRAMUSCULAR | Status: AC | PRN
  Administered 2023-12-25: 2 mL via INTRA_ARTICULAR

## 2023-12-25 NOTE — Progress Notes (Signed)
 Office Visit Note   Patient: Briana Schroeder           Date of Birth: March 02, 1946           MRN: [id number] Visit Date: 12/25/2023              Requested by: Norleen Lynwood ORN, MD [address] [place],  [place] [address] PCP: Norleen Lynwood ORN, MD   Assessment & Plan: Visit Diagnoses:  1. Chronic pain of right knee     Plan: History of Present Illness Briana Schroeder is a 78 year old female who presents with right knee pain following a fall.  She has experienced persistent right knee pain since a fall on June 12th. The pain is intermittent but consistent over the past eleven days, worsened by walking, and accompanied by swelling down her leg. During the fall, her legs gave way, causing direct impact on her kneecap. She is concerned about the impact of the knee pain on her ability to perform daily activities, including driving. She previously took aspirin daily but has since stopped. There is no current use of blood thinners.  Physical Exam MUSCULOSKELETAL: Right knee joint swollen with effusion. Right knee ligaments intact. SKIN: Healing abrasion on the front of the right knee.  Assessment and Plan Right knee effusion status post fall Effusion with discomfort and swelling post-fall. Major ligaments intact. Effusion likely causing discomfort and activity limitation. Discussed aspiration and cortisone injection versus conservative management. She chose aspiration and cortisone injection. - Aspirate 10 cc of nonbloody joint effusion from the knee. - Inject cortisone into the knee.  Probable osteoarthritis flare Probable osteoarthritis flare post-fall with knee pain and swelling. Discussed aspiration and cortisone injection versus conservative management. - Proceed with aspiration and cortisone injection.  Follow-Up Instructions: No follow-ups on file.   Orders:  Orders Placed This Encounter  Procedures   Large Joint Inj: R knee   No orders of the defined types were placed in  this encounter.     Procedures: Large Joint Inj: R knee on 12/25/2023 9:38 AM Indications: pain Details: 22 G needle  Arthrogram: No  Medications: 40 mg methylPREDNISolone  acetate 40 MG/ML; 2 mL lidocaine  1 %; 2 mL bupivacaine  0.5 % Aspirate: 10 mL clear Outcome: tolerated well, no immediate complications Consent was given by the patient. Patient was prepped and draped in the usual sterile fashion.       Clinical Data: No additional findings.   Subjective: Chief Complaint  Patient presents with   Right Knee - Pain    HPI  Review of Systems  Constitutional: Negative.   HENT: Negative.    Eyes: Negative.   Respiratory: Negative.    Cardiovascular: Negative.   Endocrine: Negative.   Musculoskeletal: Negative.   Neurological: Negative.   Hematological: Negative.   Psychiatric/Behavioral: Negative.    All other systems reviewed and are negative.    Objective: Vital Signs: There were no vitals taken for this visit.  Physical Exam Vitals and nursing note reviewed.  Constitutional:      Appearance: She is well-developed.  HENT:     Head: Atraumatic.     Nose: Nose normal.   Eyes:     Extraocular Movements: Extraocular movements intact.    Cardiovascular:     Pulses: Normal pulses.  Pulmonary:     Effort: Pulmonary effort is normal.  Abdominal:     Palpations: Abdomen is soft.   Musculoskeletal:     Cervical back: Neck supple.  Skin:    General: Skin is warm.     Capillary Refill: Capillary refill takes less than 2 seconds.   Neurological:     Mental Status: She is alert. Mental status is at baseline.   Psychiatric:        Behavior: Behavior normal.        Thought Content: Thought content normal.        Judgment: Judgment normal.     PMFS History: Patient Active Problem List   Diagnosis Date Noted   Left leg pain 10/18/2023   PMR (polymyalgia rheumatica) (HCC) 10/16/2023   Murmur 07/29/2023   Vitamin D  deficiency 07/29/2023    Bilateral impacted cerumen 07/25/2023   Rupture of left biceps tendon 06/13/2023   Rest pain of both lower extremities due to atherosclerosis (HCC) 06/13/2023   Right ear impacted cerumen 01/15/2023   Abnormal LFTs 01/15/2023   Polymyalgia (HCC) 09/01/2022   Upper extremity weakness 07/30/2022   Chest pain 04/28/2022   Chronic pain of both shoulders 04/28/2022   Anemia 04/28/2022   B12 deficiency 04/28/2022   Pain and swelling of left lower leg 04/28/2022   Arthritis 03/31/2022   Muscle cramps 01/26/2022   Chest pain, pleuritic 12/27/2021   RUQ pain 10/02/2021   Insomnia 08/09/2021   Left lumbar radiculopathy 05/12/2021   Heart murmur 05/12/2021   Pruritic rash 01/28/2021   Hives 08/22/2020   Traumatic incomplete tear of right rotator cuff 07/26/2020   Aortic atherosclerosis (HCC) 04/07/2020   Right shoulder pain 04/07/2020   Lipoma of right thigh 10/08/2019   Foot cramps 10/08/2019   Rectocele 09/29/2019   Weight loss 09/05/2018   Hyperglycemia 06/17/2018   Left sided sciatica 06/27/2017   Left-sided chest pain 05/15/2017   Radiculitis of left cervical region 10/10/2016   Abnormal auditory perception of left ear 03/08/2016   Hearing loss in right ear 12/15/2014   Elevated CK 12/02/2014   Bilateral hearing loss 05/21/2013   Hypokalemia 11/17/2012   Paresthesia 05/19/2012   Abnormal LFTs (liver function tests) 05/19/2012   Hearing loss, bilateral 05/19/2012   Preventative health care 05/15/2011   Left shoulder pain 05/15/2011   Osteopenia    Fibroid    Hearing loss sensory, bilateral 02/27/2011   EPIGASTRIC PAIN 01/09/2010   GERD 12/06/2009   DIZZINESS 12/05/2009   Hyperlipidemia 09/06/2009   FATIGUE 09/06/2009   HOARSENESS 09/06/2009   Allergic rhinitis 08/02/2009   PULMONARY SARCOIDOSIS 10/18/2008   Anxiety state 10/18/2008   Essential hypertension 10/18/2008   IBS 10/18/2008   History of colonic polyps 10/18/2008   Past Medical History:  Diagnosis Date    ALLERGIC RHINITIS 08/02/2009   Allergy    ANXIETY 10/18/2008   ARM PAIN, RIGHT 04/04/2009   Arthritis    BACK PAIN 08/02/2009   CHEST PAIN 12/05/2009   COLONIC POLYPS, HX OF 10/18/2008   DIZZINESS 12/05/2009   EPIGASTRIC PAIN 01/09/2010   FATIGUE 09/06/2009   GERD 12/06/2009   HOARSENESS 09/06/2009   HYPERLIPIDEMIA 10/18/2008   HYPERTENSION 10/18/2008   IBS 10/18/2008   MYALGIA 03/21/2009   Osteopenia 12/2018   T score -1.7 FRAX 12% / 2.5% table from prior DEXA 2018   PULMONARY SARCOIDOSIS 10/18/2008   SINUSITIS- ACUTE-NOS 03/21/2009   VOCAL CORD NODULE 09/06/2009    Family History  Problem Relation Age of Onset   Diabetes Mother    Hypertension Mother    Breast cancer Mother 104   Hypertension Sister    Colon cancer Neg Hx    Colon  polyps Neg Hx    Rectal cancer Neg Hx    Stomach cancer Neg Hx    Esophageal cancer Neg Hx     Past Surgical History:  Procedure Laterality Date   ABDOMINAL HYSTERECTOMY  08/1992   TAH-DR G FOR FIBROIDS AND MENORRHAGIA   COLONOSCOPY  11-24-2009   3 polyps, +TA and HPP   hx of vocal cord nodules     MUSCLE BIOPSY Right 08/23/2022   Procedure: RIGHT DELTOID MUSCLE BIOPSY;  Surgeon: Aron Shoulders, MD;  Location: [facility];  Service: General;  Laterality: Right;   s/p right finger tendon surgury     UPPER GASTROINTESTINAL ENDOSCOPY  01-08-2010   normal   Social History   Occupational History   Occupation: Water engineer  Tobacco Use   Smoking status: Never   Smokeless tobacco: Never  Vaping Use   Vaping status: Never Used  Substance and Sexual Activity   Alcohol use: No    Alcohol/week: 0.0 standard drinks of alcohol   Drug use: No   Sexual activity: Not Currently    Birth control/protection: Surgical    Comment: 1st intercourse 20 yo-1 partner

## 2023-12-27 ENCOUNTER — Telehealth: Payer: Self-pay | Admitting: Orthopaedic Surgery

## 2023-12-27 NOTE — Telephone Encounter (Signed)
 Patient aware of the below message

## 2023-12-27 NOTE — Telephone Encounter (Signed)
 Patient called and said that after the drainage her right knee still hurts and is there anything you think she should do? CB#[phone]

## 2023-12-27 NOTE — Telephone Encounter (Signed)
 Just need to give the cortisone shot a little more time to work.  Stay off of it and use ibuprofen and ice.

## 2023-12-30 ENCOUNTER — Telehealth: Payer: Self-pay | Admitting: Orthopaedic Surgery

## 2023-12-30 ENCOUNTER — Other Ambulatory Visit: Payer: Self-pay | Admitting: Internal Medicine

## 2023-12-30 ENCOUNTER — Other Ambulatory Visit: Payer: Self-pay

## 2023-12-30 NOTE — Telephone Encounter (Signed)
 Let's order CT scan to rule out fracture

## 2023-12-30 NOTE — Telephone Encounter (Signed)
 Patient called and said since last week she is in a lot of pain. She said she put ice packs on it and she can barely walk. CB#[phone]

## 2023-12-31 ENCOUNTER — Other Ambulatory Visit: Payer: Self-pay

## 2023-12-31 DIAGNOSIS — M25511 Pain in right shoulder: Secondary | ICD-10-CM | POA: Diagnosis not present

## 2023-12-31 DIAGNOSIS — M25561 Pain in right knee: Secondary | ICD-10-CM | POA: Diagnosis not present

## 2023-12-31 DIAGNOSIS — M25512 Pain in left shoulder: Secondary | ICD-10-CM | POA: Diagnosis not present

## 2023-12-31 DIAGNOSIS — G8929 Other chronic pain: Secondary | ICD-10-CM

## 2023-12-31 NOTE — Telephone Encounter (Signed)
 Called patient. Goes straight to VM. Will try again later. Just need to advise we put in order for CT scan per Dr. JERRI. They will call her to schedule appt.  See messages below.

## 2023-12-31 NOTE — Telephone Encounter (Signed)
 Order made

## 2024-01-05 ENCOUNTER — Other Ambulatory Visit: Payer: Self-pay | Admitting: Internal Medicine

## 2024-01-13 DIAGNOSIS — M25561 Pain in right knee: Secondary | ICD-10-CM | POA: Diagnosis not present

## 2024-02-05 DIAGNOSIS — M25561 Pain in right knee: Secondary | ICD-10-CM | POA: Diagnosis not present

## 2024-02-12 DIAGNOSIS — M1711 Unilateral primary osteoarthritis, right knee: Secondary | ICD-10-CM | POA: Diagnosis not present

## 2024-02-12 DIAGNOSIS — M25561 Pain in right knee: Secondary | ICD-10-CM | POA: Diagnosis not present

## 2024-02-19 ENCOUNTER — Ambulatory Visit (INDEPENDENT_AMBULATORY_CARE_PROVIDER_SITE_OTHER): Admitting: Neurology

## 2024-02-19 ENCOUNTER — Encounter: Payer: Self-pay | Admitting: Neurology

## 2024-02-19 VITALS — BP 118/71 | HR 76 | Ht 63.0 in | Wt 116.0 lb

## 2024-02-19 DIAGNOSIS — R202 Paresthesia of skin: Secondary | ICD-10-CM | POA: Diagnosis not present

## 2024-02-19 DIAGNOSIS — M48061 Spinal stenosis, lumbar region without neurogenic claudication: Secondary | ICD-10-CM

## 2024-02-19 MED ORDER — DULOXETINE HCL 30 MG PO CPEP: 30.0000 mg | ORAL_CAPSULE | Freq: Two times a day (BID) | ORAL | 3 refills | Status: AC

## 2024-02-19 NOTE — Progress Notes (Signed)
 Follow-up Visit   Date: 02/19/2024    Briana Schroeder MRN: [id number] DOB: 23-Dec-1945    Briana Schroeder is a 78 y.o. right-handed female with GERD, hypertension, and polyarthralgia returning to the clinic for follow-up of bilateral feet parethesias.  The patient was accompanied to the clinic by self.  IMPRESSION/PLAN: Bilateral painful paresthesias of the feet.  No evidence of large fiber neuropathy on NCS.  EMG suggests possible bilateral L5 radiculopathy. MRI lumbar spine shows severe spinal canal stenosis at L4-5 and she may have sensory symptoms related to this. She does not have symptoms of neurogenic claudication.  - Previously tried: gabapentin  900mg /d (no improvement), PT  - Increase duloxetine  to 30mg  twice daily - Referral to neurosurgery for their opinion on lumbar canal stenosis, however, patient informed that unlikely that intervention is indicated given that she is only mildly symptomatic  Polyarthralgia followed by Dr. Curt, rheumatologist.   Return to clinic in 6 months  --------------------------------------------- UPDATE 11/29/2022:  She is here for EDX.  She continues to have tingling involving both feet.  Prior NCS/EMG performed at [facility] showed findings consistent with right peroneal mononeuropathy, however these findings did match her clinical presentation.  She does not have foot weakness.  She denies low back pain.  UPDATE 02/19/2023:  Since her last visit, her feet tingling has improved.  She takes gabapentin  300mg  at bedtime which helps.  Her primary issue today is achy and throbbing left hip pain.  She denies weakness.  Pain is improved with tramadol , which is prescribed by her PCP.    UPDATE 08/26/2023:  She is here for 6 month follow-up visit.  She continues to have tingling in the feet, which is worse at night time.  She takes gabapentin  300mg  at bedtime which helps some.  No weakness.  She has a lot of generalized joint pain and is followed by  rheumatology.    UPDATE 12/24/2023:  She is here for sooner follow-up visit because of worsening bilateral toe tingling, which makes it difficult for her to sleep at night.  She is taking gabapentin  900mg  without any relief and would like to try something different.  MRI lumbar spine from 2024 shows spinal canal stenosis at L4-5. She denies leg heaviness, difficulty with walking, weakness, or low back pain.   UPDATE 02/19/2024:  She is here for follow-up visit.  She is tolerating cymbalta  30mg  and reports that tingling is somewhat better.  She continues to have painful tingling in the feet some nights.  She is doing better off gabapentin .   She has right knee pain which is followed by Dr. Kay.  She is planning for right TKA in November.  She denies leg heaviness, difficulty walking, weakness, or low back pain.    Medications:  Current Outpatient Medications on File Prior to Visit  Medication Sig Dispense Refill   amLODipine  (NORVASC ) 10 MG tablet Take 1 tablet by mouth once daily 90 tablet 3   ascorbic acid (VITAMIN C) 500 MG tablet Take 500 mg by mouth daily.     cetirizine  (ZYRTEC ) 10 MG tablet Take 1 tablet by mouth once daily 90 tablet 0   cyclobenzaprine  (FLEXERIL ) 5 MG tablet Take 1 tablet (5 mg total) by mouth at bedtime as needed for muscle spasms. 90 tablet 3   DULoxetine  (CYMBALTA ) 30 MG capsule Take 1 capsule (30 mg total) by mouth at bedtime. 30 capsule 3   losartan  (COZAAR ) 50 MG tablet Take 1 tablet by mouth once daily  90 tablet 2   meloxicam  (MOBIC ) 15 MG tablet TAKE 1 TABLET BY MOUTH ONCE DAILY AS NEEDED 90 tablet 3   Methylcobalamin (B-12) 5000 MCG TBDP Take 5,000 mcg by mouth daily.     pantoprazole  (PROTONIX ) 40 MG tablet Take 1 tablet by mouth once daily 90 tablet 0   RESTASIS 0.05 % ophthalmic emulsion SMARTSIG:1 In Eye(s) Twice Daily     rosuvastatin  (CRESTOR ) 10 MG tablet Take 1 tablet (10 mg total) by mouth daily. 90 tablet 3   traMADol  (ULTRAM ) 50 MG tablet TAKE 1 TABLET  BY MOUTH EVERY 6 HOURS AS NEEDED 60 tablet 2   VITAMIN D , CHOLECALCIFEROL, PO Take 1 tablet by mouth daily.     gabapentin  (NEURONTIN ) 300 MG capsule Take 3 capsules (900 mg total) by mouth at bedtime. (Patient not taking: Reported on 02/19/2024) 180 capsule 1   predniSONE  (DELTASONE ) 10 MG tablet 1 tab by mouth once daily (Patient not taking: Reported on 02/19/2024) 30 tablet 2   No current facility-administered medications on file prior to visit.    Allergies: No Known Allergies  Vital Signs:  BP 118/71   Pulse 76   Ht 5' 3 (1.6 m)   Wt 116 lb (52.6 kg)   SpO2 100%   BMI 20.55 kg/m    Neurological Exam: MENTAL STATUS including orientation to time, place, person, recent and remote memory, attention span and concentration, language, and fund of knowledge is normal.  Speech is not dysarthric.  CRANIAL NERVES:   Normal conjugate, extra-ocular eye movements in all directions of gaze.  No ptosis.  Face is symmetric.   MOTOR:  Motor strength is 5/5 in all extremities.  No atrophy, fasciculations or abnormal movements.  No pronator drift.  Tone is normal.    REFLEXES:  Reflexes are 2+/4 throughout, except 3+/4 at the knees.   SENSATION:  Intact to vibration throughout.   COORDINATION/GAIT:  Normal finger-to- nose-finger.  Intact rapid alternating movements bilaterally.  Gait slightly antalgic with right leg mildly externally rotated, unassisted, stable.  Data: NCS/EMG of the legs 11/29/2022: Chronic L5 radiculopathy affecting bilateral lower extremities, mild and worse on the right. There is no evidence of a large fiber sensorimotor polyneuropathy affecting the lower extremities.  MRI lumbar spine 05/15/2023: 1. L3-4: Advanced bilateral facet arthritis with degenerative anterolisthesis of 3 mm. Mild canal narrowing but no visible neural compression. The facet arthritis could be symptomatic. This has worsened since 2018. 2. L4-5: Advanced bilateral facet arthropathy with  anterolisthesis of 9 mm. Bulging of the disc. Facet hypertrophy is more severe on the left than the right. There is multifactorial spinal stenosis at this level that could cause neural compression on either or both sides, particularly on the left. The facet arthritis could be painful. This has also worsened since 2018.  Thank you for allowing me to participate in patient's care.  If I can answer any additional questions, I would be pleased to do so.    Sincerely,    Kalman Nylen K. Tobie, DO

## 2024-02-19 NOTE — Patient Instructions (Signed)
 Increase Cymbalta  to 30mg  twice daily  We will refer you to neurosurgery

## 2024-02-21 ENCOUNTER — Ambulatory Visit

## 2024-02-21 VITALS — BP 100/70 | HR 70 | Ht 61.5 in | Wt 116.0 lb

## 2024-02-21 DIAGNOSIS — M8580 Other specified disorders of bone density and structure, unspecified site: Secondary | ICD-10-CM | POA: Diagnosis not present

## 2024-02-21 DIAGNOSIS — Z0000 Encounter for general adult medical examination without abnormal findings: Secondary | ICD-10-CM | POA: Diagnosis not present

## 2024-02-21 DIAGNOSIS — Z780 Asymptomatic menopausal state: Secondary | ICD-10-CM | POA: Diagnosis not present

## 2024-02-21 DIAGNOSIS — Z1231 Encounter for screening mammogram for malignant neoplasm of breast: Secondary | ICD-10-CM

## 2024-02-21 NOTE — Progress Notes (Signed)
 Subjective:   Briana Schroeder is a 78 y.o. who presents for a Medicare Wellness preventive visit.  As a reminder, Annual Wellness Visits don't include a physical exam, and some assessments may be limited, especially if this visit is performed virtually. We may recommend an in-person follow-up visit with your provider if needed.  Visit Complete: In person   Persons Participating in Visit: Patient.  AWV Questionnaire: No: Patient Medicare AWV questionnaire was not completed prior to this visit.  Cardiac Risk Factors include: advanced age (>34men, >17 women);hypertension;dyslipidemia     Objective:    Today's Vitals   02/21/24 0923  BP: 100/70  Pulse: 70  SpO2: 100%  Weight: 116 lb (52.6 kg)  Height: 5' 1.5 (1.562 m)   Body mass index is 21.56 kg/m.     02/21/2024    9:29 AM 02/19/2024    8:35 AM 12/24/2023    9:40 AM 08/26/2023    9:22 AM 02/19/2023    9:23 AM 11/13/2022    9:49 AM 08/10/2022    9:00 AM  Advanced Directives  Does Patient Have a Medical Advance Directive? No No No Yes No No No  Type of Theme park manager;Living will       Current Medications (verified) Outpatient Encounter Medications as of 02/21/2024  Medication Sig   amLODipine  (NORVASC ) 10 MG tablet Take 1 tablet by mouth once daily   ascorbic acid (VITAMIN C) 500 MG tablet Take 500 mg by mouth daily.   cetirizine  (ZYRTEC ) 10 MG tablet Take 1 tablet by mouth once daily   cyclobenzaprine  (FLEXERIL ) 5 MG tablet Take 1 tablet (5 mg total) by mouth at bedtime as needed for muscle spasms.   losartan  (COZAAR ) 50 MG tablet Take 1 tablet by mouth once daily   meloxicam  (MOBIC ) 15 MG tablet TAKE 1 TABLET BY MOUTH ONCE DAILY AS NEEDED   Methylcobalamin (B-12) 5000 MCG TBDP Take 5,000 mcg by mouth daily.   pantoprazole  (PROTONIX ) 40 MG tablet Take 1 tablet by mouth once daily   RESTASIS 0.05 % ophthalmic emulsion SMARTSIG:1 In Eye(s) Twice Daily   rosuvastatin  (CRESTOR ) 10 MG  tablet Take 1 tablet (10 mg total) by mouth daily.   traMADol  (ULTRAM ) 50 MG tablet TAKE 1 TABLET BY MOUTH EVERY 6 HOURS AS NEEDED   VITAMIN D , CHOLECALCIFEROL, PO Take 1 tablet by mouth daily.   DULoxetine  (CYMBALTA ) 30 MG capsule Take 1 capsule (30 mg total) by mouth 2 (two) times daily. (Patient not taking: Reported on 02/21/2024)   predniSONE  (DELTASONE ) 10 MG tablet 1 tab by mouth once daily (Patient not taking: Reported on 02/21/2024)   No facility-administered encounter medications on file as of 02/21/2024.    Allergies (verified) Patient has no known allergies.   History: Past Medical History:  Diagnosis Date   ALLERGIC RHINITIS 08/02/2009   Allergy    ANXIETY 10/18/2008   ARM PAIN, RIGHT 04/04/2009   Arthritis    BACK PAIN 08/02/2009   CHEST PAIN 12/05/2009   COLONIC POLYPS, HX OF 10/18/2008   DIZZINESS 12/05/2009   EPIGASTRIC PAIN 01/09/2010   FATIGUE 09/06/2009   GERD 12/06/2009   HOARSENESS 09/06/2009   HYPERLIPIDEMIA 10/18/2008   HYPERTENSION 10/18/2008   IBS 10/18/2008   MYALGIA 03/21/2009   Osteopenia 12/2018   T score -1.7 FRAX 12% / 2.5% table from prior DEXA 2018   PULMONARY SARCOIDOSIS 10/18/2008   SINUSITIS- ACUTE-NOS 03/21/2009   VOCAL CORD NODULE 09/06/2009   Past Surgical  History:  Procedure Laterality Date   ABDOMINAL HYSTERECTOMY  08/1992   TAH-DR G FOR FIBROIDS AND MENORRHAGIA   COLONOSCOPY  11-24-2009   3 polyps, +TA and HPP   hx of vocal cord nodules     MUSCLE BIOPSY Right 08/23/2022   Procedure: RIGHT DELTOID MUSCLE BIOPSY;  Surgeon: Aron Shoulders, MD;  Location: [facility];  Service: General;  Laterality: Right;   s/p right finger tendon surgury     UPPER GASTROINTESTINAL ENDOSCOPY  01-08-2010   normal   Family History  Problem Relation Age of Onset   Diabetes Mother    Hypertension Mother    Breast cancer Mother 26   Hypertension Sister    Colon cancer Neg Hx    Colon polyps Neg Hx    Rectal cancer Neg Hx    Stomach cancer Neg Hx     Esophageal cancer Neg Hx    Social History   Socioeconomic History   Marital status: Married    Spouse name: Briana Schroeder   Number of children: 2   Years of education: Not on file   Highest education level: Associate degree: occupational, Scientist, product/process development, or vocational program  Occupational History   Occupation: Water engineer  Tobacco Use   Smoking status: Never   Smokeless tobacco: Never  Vaping Use   Vaping status: Never Used  Substance and Sexual Activity   Alcohol use: No    Alcohol/week: 0.0 standard drinks of alcohol   Drug use: No   Sexual activity: Not Currently    Birth control/protection: Surgical    Comment: 1st intercourse 20 yo-1 partner  Other Topics Concern   Not on file  Social History Narrative   Right Handed    Lives in a two story home. Lives with husband   Social Drivers of Corporate investment banker Strain: Low Risk  (02/21/2024)   Overall Financial Resource Strain (CARDIA)    Difficulty of Paying Living Expenses: Not hard at all  Food Insecurity: No Food Insecurity (02/21/2024)   Hunger Vital Sign    Worried About Running Out of Food in the Last Year: Never true    Ran Out of Food in the Last Year: Never true  Transportation Needs: No Transportation Needs (02/21/2024)   PRAPARE - Administrator, Civil Service (Medical): No    Lack of Transportation (Non-Medical): No  Physical Activity: Inactive (02/21/2024)   Exercise Vital Sign    Days of Exercise per Week: 0 days    Minutes of Exercise per Session: 0 min  Stress: No Stress Concern Present (02/21/2024)   Briana Schroeder of Occupational Health - Occupational Stress Questionnaire    Feeling of Stress: Not at all  Social Connections: Socially Integrated (02/21/2024)   Social Connection and Isolation Panel    Frequency of Communication with Friends and Family: More than three times a week    Frequency of Social Gatherings with Friends and Family: Never    Attends Religious Services:  More than 4 times per year    Active Member of [employer] or Organizations: Yes    Attends Banker Meetings: 1 to 4 times per year    Marital Status: Married    Tobacco Counseling Counseling given: Not Answered    Clinical Intake:  Pre-visit preparation completed: Yes  Pain : No/denies pain     BMI - recorded: 21.56 Nutritional Status: BMI of 19-24  Normal Nutritional Risks: None Diabetes: No  Lab Results  Component Value Date  HGBA1C 6.3 07/25/2023   HGBA1C 6.1 04/02/2023   HGBA1C 6.6 (H) 10/11/2021     How often do you need to have someone help you when you read instructions, pamphlets, or other written materials from your doctor or pharmacy?: 1 - Never  Interpreter Needed?: No  Information entered by :: Briana Schroeder, Briana Schroeder   Activities of Daily Living     02/21/2024    9:17 AM  In your present state of health, do you have any difficulty performing the following activities:  Hearing? 0  Vision? 0  Difficulty concentrating or making decisions? 0  Walking or climbing stairs? 0  Dressing or bathing? 0  Doing errands, shopping? 0  Preparing Food and eating ? N  Using the Toilet? N  In the past six months, have you accidently leaked urine? N  Do you have problems with loss of bowel control? N  Managing your Medications? N  Managing your Finances? N  Housekeeping or managing your Housekeeping? N    Patient Care Team: Briana Schroeder ORN, MD as PCP - General Fontaine, Evalene SQUIBB, MD (Inactive) as Consulting Physician (Gynecology) Aneita Gwendlyn DASEN, MD (Inactive) as Consulting Physician (Gastroenterology) Magdalen Pasco RAMAN, DPM as Consulting Physician (Podiatry) Patel, Donika K, DO as Consulting Physician (Neurology)  I have updated your Care Teams any recent Medical Services you may have received from other providers in the past year.     Assessment:   This is a routine wellness examination for Briana Schroeder.  Hearing/Vision screen Hearing Screening -  Comments:: Denies hearing difficulties   Vision Screening - Comments:: Wears eyeglasses   Goals Addressed             This Visit's Progress    Increase water intake   On track    Will try to increase my daily intake of water and continue to eat healthy and stay physically active.        Depression Screen     02/21/2024    9:33 AM 10/16/2023    8:44 AM 07/25/2023    9:59 AM 04/02/2023    8:38 AM 01/15/2023   11:01 AM 08/28/2022   10:09 AM 03/29/2022    3:48 PM  PHQ 2/9 Scores  PHQ - 2 Score 0 0 0 0 0 0   PHQ- 9 Score 3     0   Exception Documentation       Patient refusal    Fall Risk     02/21/2024    9:30 AM 02/19/2024    8:35 AM 12/24/2023    9:40 AM 10/16/2023    8:51 AM 08/26/2023    9:22 AM  Fall Risk   Falls in the past year? 1 1 1 1 1   Number falls in past yr: 0 0 0 0 0  Injury with Fall? 0 1 1 0 0  Risk for fall due to :    History of fall(s)   Follow up Falls evaluation completed;Falls prevention discussed Falls evaluation completed Falls evaluation completed Falls evaluation completed Falls evaluation completed    MEDICARE RISK AT HOME:  Medicare Risk at Home Any stairs in or around the home?: Yes (has a chair lift) If so, are there any without handrails?: No Home free of loose throw rugs in walkways, pet beds, electrical cords, etc?: Yes Adequate lighting in your home to reduce risk of falls?: Yes Life alert?: No Use of a cane, walker or w/c?: No Grab bars in the bathroom?: Yes Shower chair or  bench in shower?: Yes Elevated toilet seat or a handicapped toilet?: Yes  TIMED UP AND GO:  Was the test performed?  Yes  Length of time to ambulate 10 feet: 20 sec Gait slow and steady without use of assistive device  Cognitive Function: Declined/Normal: No cognitive concerns noted by patient or family. Patient alert, oriented, able to answer questions appropriately and recall recent events. No signs of memory loss or confusion.        11/17/2019    9:32 AM   6CIT Screen  What Year? 0 points  What month? 0 points  What time? 0 points  Count back from 20 0 points  Months in reverse 0 points  Repeat phrase 2 points  Total Score 2 points    Immunizations Immunization History  Administered Date(s) Administered   PFIZER(Purple Top)SARS-COV-2 Vaccination 08/10/2019, 09/07/2019, 06/09/2020, 07/07/2021   PPD Test 12/21/2022, 01/04/2023   Pneumococcal Conjugate-13 05/21/2013   Pneumococcal Polysaccharide-23 09/06/2009, 11/06/2017   Td 10/18/2008   Zoster, Live 10/18/2008    Screening Tests Health Maintenance  Topic Date Due   Zoster Vaccines- Shingrix (1 of 2) 09[birth year]27/1964   DTaP/Tdap/Td (2 - Tdap) 10/19/2018   COVID-19 Vaccine (5 - 2024-25 season) 03/03/2023   Medicare Annual Wellness (AWV)  02/20/2025   Pneumococcal Vaccine: 50+ Years  Completed   DEXA SCAN  Completed   Hepatitis C Screening  Completed   HPV VACCINES  Aged Out   Meningococcal B Vaccine  Aged Out   Colonoscopy  Discontinued    Health Maintenance  Health Maintenance Due  Topic Date Due   Zoster Vaccines- Shingrix (1 of 2) 09[birth year]27/1964   DTaP/Tdap/Td (2 - Tdap) 10/19/2018   COVID-19 Vaccine (5 - 2024-25 season) 03/03/2023   Health Maintenance Items Addressed: Mammogram ordered, DEXA ordered, See Nurse Notes at the end of this note  Additional Screening:  Vision Screening: Recommended annual ophthalmology exams for early detection of glaucoma and other disorders of the eye. Would you like a referral to an eye doctor? No    Dental Screening: Recommended annual dental exams for proper oral hygiene  Community Resource Referral / Chronic Care Management: CRR required this visit?  No   CCM required this visit?  No   Plan:    I have personally reviewed and noted the following in the patient's chart:   Medical and social history Use of alcohol, tobacco or illicit drugs  Current medications and supplements including opioid prescriptions. Patient is currently  taking opioid prescriptions. Information provided to patient regarding non-opioid alternatives. Patient advised to discuss non-opioid treatment plan with their provider. Functional ability and status Nutritional status Physical activity Advanced directives List of other physicians Hospitalizations, surgeries, and ER visits in previous 12 months Vitals Screenings to include cognitive, depression, and falls Referrals and appointments  In addition, I have reviewed and discussed with patient certain preventive protocols, quality metrics, and best practice recommendations. A written personalized care plan for preventive services as well as general preventive health recommendations were provided to patient.   Briana Schroeder, CMA   02/21/2024   After Visit Summary: (In Person-Printed) AVS printed and given to the patient  Notes: Patient is due for a mammogram and a DEXA, which orders have been placed today.  She declines any vaccines at this time, (Tdap and Shingrix).  Patient had no other concerns to address today.

## 2024-02-21 NOTE — Patient Instructions (Signed)
 Ms. Haverstick , Thank you for taking time out of your busy schedule to complete your Annual Wellness Visit with me. I enjoyed our conversation and look forward to speaking with you again next year. I, as well as your care team,  appreciate your ongoing commitment to your health goals. Please review the following plan we discussed and let me know if I can assist you in the future. Your Game plan/ To Do List    Referrals: If you haven't heard from the office you've been referred to, please reach out to them at the phone provided.  You have an order for:  [x]   3D Mammogram  [x]   Bone Density     Please call for appointment:  [facility] [address] [place], [place] [address] [phone]  [facility] [place] [facility] [address] [place], [place] [address] [phone]    Make sure to wear two-piece clothing.  No lotions, powders, or deodorants the day of the appointment. Make sure to bring picture ID and insurance card.  Bring list of medications you are currently taking including any supplements.    Follow up Visits: We will see or speak with you next year for your Next Medicare AWV with our clinical staff Have you seen your provider in the last 6 months (3 months if uncontrolled diabetes)? Yes.  Last office visit on 10/16/2023.  Clinician Recommendations:  Aim for 30 minutes of exercise or brisk walking, 6-8 glasses of water, and 5 servings of fruits and vegetables each day. Keep up the good work.      This is a list of the screenings recommended for you:  Health Maintenance  Topic Date Due   Zoster (Shingles) Vaccine (1 of 2) 09[birth year]27/1964   DTaP/Tdap/Td vaccine (2 - Tdap) 10/19/2018   COVID-19 Vaccine (5 - 2024-25 season) 03/03/2023   Medicare Annual Wellness Visit  02/20/2025   Pneumococcal Vaccine for age over 26  Completed   DEXA scan (bone density measurement)  Completed   Hepatitis C Screening  Completed   HPV Vaccine  Aged Out    Meningitis B Vaccine  Aged Out   Colon Cancer Screening  Discontinued    Advanced directives: (Declined) Advance directive discussed with you today. Even though you declined this today, please call our office should you change your mind, and we can give you the proper paperwork for you to fill out. Advance Care Planning is important because it:  [x]  Makes sure you receive the medical care that is consistent with your values, goals, and preferences  [x]  It provides guidance to your family and loved ones and reduces their decisional burden about whether or not they are making the right decisions based on your wishes.  Follow the link provided in your after visit summary or read over the paperwork we have mailed to you to help you started getting your Advance Directives in place. If you need assistance in completing these, please reach out to us  so that we can help you!  See attachments for Preventive Care and Fall Prevention Tips.

## 2024-02-26 ENCOUNTER — Telehealth: Payer: Self-pay

## 2024-02-26 NOTE — Telephone Encounter (Signed)
 Copied from CRM [id number]. Topic: General - Other >> Feb 25, 2024  4:06 PM Lavanda D wrote: Reason for CRM: Megan with [facility] is calling due to a faxed clearance missing most recent labs and office visit notes. She would like this faxed over at the soonest convenience.   CB#: [phone] Fax: [phone]

## 2024-02-28 NOTE — Telephone Encounter (Signed)
 Has since been resent with pertinent lab results and office note

## 2024-03-06 ENCOUNTER — Telehealth: Payer: Self-pay | Admitting: Radiology

## 2024-03-06 NOTE — Telephone Encounter (Signed)
 Copied from CRM [id number]. Topic: Clinical - Medical Advice >> Mar 06, 2024  4:31 PM Anairis L wrote: Reason for CRM: Returned call, claims office just called her.

## 2024-03-09 DIAGNOSIS — M48061 Spinal stenosis, lumbar region without neurogenic claudication: Secondary | ICD-10-CM | POA: Diagnosis not present

## 2024-03-10 ENCOUNTER — Other Ambulatory Visit: Payer: Self-pay | Admitting: Internal Medicine

## 2024-03-10 DIAGNOSIS — H04123 Dry eye syndrome of bilateral lacrimal glands: Secondary | ICD-10-CM | POA: Diagnosis not present

## 2024-03-10 DIAGNOSIS — H25043 Posterior subcapsular polar age-related cataract, bilateral: Secondary | ICD-10-CM | POA: Diagnosis not present

## 2024-03-10 DIAGNOSIS — H2513 Age-related nuclear cataract, bilateral: Secondary | ICD-10-CM | POA: Diagnosis not present

## 2024-03-10 DIAGNOSIS — H18413 Arcus senilis, bilateral: Secondary | ICD-10-CM | POA: Diagnosis not present

## 2024-03-10 DIAGNOSIS — H2512 Age-related nuclear cataract, left eye: Secondary | ICD-10-CM | POA: Diagnosis not present

## 2024-03-17 DIAGNOSIS — M545 Low back pain, unspecified: Secondary | ICD-10-CM | POA: Diagnosis not present

## 2024-03-17 DIAGNOSIS — R2689 Other abnormalities of gait and mobility: Secondary | ICD-10-CM | POA: Diagnosis not present

## 2024-03-17 DIAGNOSIS — M6281 Muscle weakness (generalized): Secondary | ICD-10-CM | POA: Diagnosis not present

## 2024-03-24 DIAGNOSIS — M545 Low back pain, unspecified: Secondary | ICD-10-CM | POA: Diagnosis not present

## 2024-03-24 DIAGNOSIS — M6281 Muscle weakness (generalized): Secondary | ICD-10-CM | POA: Diagnosis not present

## 2024-03-24 DIAGNOSIS — R2689 Other abnormalities of gait and mobility: Secondary | ICD-10-CM | POA: Diagnosis not present

## 2024-03-26 ENCOUNTER — Other Ambulatory Visit: Payer: Self-pay | Admitting: Internal Medicine

## 2024-03-26 DIAGNOSIS — R2689 Other abnormalities of gait and mobility: Secondary | ICD-10-CM | POA: Diagnosis not present

## 2024-03-26 DIAGNOSIS — M545 Low back pain, unspecified: Secondary | ICD-10-CM | POA: Diagnosis not present

## 2024-03-26 DIAGNOSIS — M6281 Muscle weakness (generalized): Secondary | ICD-10-CM | POA: Diagnosis not present

## 2024-03-31 DIAGNOSIS — R2689 Other abnormalities of gait and mobility: Secondary | ICD-10-CM | POA: Diagnosis not present

## 2024-03-31 DIAGNOSIS — M6281 Muscle weakness (generalized): Secondary | ICD-10-CM | POA: Diagnosis not present

## 2024-03-31 DIAGNOSIS — M545 Low back pain, unspecified: Secondary | ICD-10-CM | POA: Diagnosis not present

## 2024-04-02 ENCOUNTER — Other Ambulatory Visit: Payer: Self-pay | Admitting: Internal Medicine

## 2024-04-02 ENCOUNTER — Other Ambulatory Visit: Payer: Self-pay

## 2024-04-02 DIAGNOSIS — R2689 Other abnormalities of gait and mobility: Secondary | ICD-10-CM | POA: Diagnosis not present

## 2024-04-02 DIAGNOSIS — M6281 Muscle weakness (generalized): Secondary | ICD-10-CM | POA: Diagnosis not present

## 2024-04-02 DIAGNOSIS — M545 Low back pain, unspecified: Secondary | ICD-10-CM | POA: Diagnosis not present

## 2024-04-07 ENCOUNTER — Other Ambulatory Visit: Payer: Self-pay | Admitting: Neurology

## 2024-04-07 ENCOUNTER — Ambulatory Visit: Payer: Self-pay

## 2024-04-07 DIAGNOSIS — R2689 Other abnormalities of gait and mobility: Secondary | ICD-10-CM | POA: Diagnosis not present

## 2024-04-07 DIAGNOSIS — M6281 Muscle weakness (generalized): Secondary | ICD-10-CM | POA: Diagnosis not present

## 2024-04-07 DIAGNOSIS — M545 Low back pain, unspecified: Secondary | ICD-10-CM | POA: Diagnosis not present

## 2024-04-07 NOTE — Telephone Encounter (Signed)
 FYI Only or Action Required?: FYI only for provider.  Patient was last seen in primary care on 10/16/2023 by Norleen Lynwood ORN, MD.  Called Nurse Triage reporting Diarrhea.  Symptoms began 1 to 2 weeks.  Interventions attempted: OTC medications: peto-bismuth.  Symptoms are: unchanged.  Triage Disposition: See PCP When Office is Open (Within 3 Days)  Patient/caregiver understands and will follow disposition?: Yes    Pt having dark green diarrhea and mucus in throat.   Reason for Disposition  [1] Sinus congestion (pressure, fullness) AND [2] present > 10 days  [1] MILD diarrhea (e.g., 1-3 or more stools than normal in past 24 hours) AND [2] present >  7 days  (Exception: Chronic diarrhea that is not worse.)  Answer Assessment - Initial Assessment Questions 1. DIARRHEA SEVERITY: How bad is the diarrhea? How many more stools have you had in the past 24 hours than normal?      unsure 2. ONSET: When did the diarrhea begin?      X 1 to 2 weeks 3. STOOL DESCRIPTION:  How loose or watery is the diarrhea? What is the stool color? Is there any blood or mucous in the stool?     Watery loose,dark green color 4. VOMITING: Are you also vomiting? If Yes, ask: How many times in the past 24 hours?      no 5. ABDOMEN PAIN: Are you having any abdomen pain? If Yes, ask: What does it feel like? (e.g., crampy, dull, intermittent, constant)      no 6. ABDOMEN PAIN SEVERITY: If present, ask: How bad is the pain?  (e.g., Scale 1-10; mild, moderate, or severe)     no 7. ORAL INTAKE: If vomiting, Have you been able to drink liquids? How much liquids have you had in the past 24 hours?     Yes  8. HYDRATION: Any signs of dehydration? (e.g., dry mouth [not just dry lips], too weak to stand, dizziness, new weight loss) When did you last urinate?     Weak to stand - pt fell last Thursday 9. EXPOSURE: Have you traveled to a foreign country recently? Have you been exposed to anyone  with diarrhea? Could you have eaten any food that was spoiled?     na 10. ANTIBIOTIC USE: Are you taking antibiotics now or have you taken antibiotics in the past 2 months?       no 11. OTHER SYMPTOMS: Do you have any other symptoms? (e.g., fever, blood in stool)       no 12. PREGNANCY: Is there any chance you are pregnant? When was your last menstrual period?       na  Answer Assessment - Initial Assessment Questions 1. LOCATION: Where does it hurt?      No pain 2. ONSET: When did the sinus pain start?  (e.g., hours, days)      X ongoing  3. SEVERITY: How bad is the pain?   (Scale 0-10; or none, mild, moderate or severe)     moderate 4. RECURRENT SYMPTOM: Have you ever had sinus problems before? If Yes, ask: When was the last time? and What happened that time?      yes 5. NASAL CONGESTION: Is the nose blocked? If Yes, ask: Can you open it or must you breathe through your mouth?     stuffy 6. NASAL DISCHARGE: Do you have discharge from your nose? If so ask, What color?     no 7. FEVER: Do you have a fever? If  Yes, ask: What is it, how was it measured, and when did it start?      no 8. OTHER SYMPTOMS: Do you have any other symptoms? (e.g., sore throat, cough, earache, difficulty breathing)     Cough with clear mucous 9. PREGNANCY: Is there any chance you are pregnant? When was your last menstrual period?     na  Protocols used: Diarrhea-A-AH, Sinus Pain or Congestion-A-AH

## 2024-04-08 ENCOUNTER — Ambulatory Visit: Payer: Self-pay | Admitting: Emergency Medicine

## 2024-04-08 ENCOUNTER — Encounter: Payer: Self-pay | Admitting: Emergency Medicine

## 2024-04-08 ENCOUNTER — Ambulatory Visit (INDEPENDENT_AMBULATORY_CARE_PROVIDER_SITE_OTHER): Admitting: Emergency Medicine

## 2024-04-08 VITALS — BP 128/72 | HR 82 | Temp 98.6°F | Ht 61.5 in | Wt 115.0 lb

## 2024-04-08 DIAGNOSIS — R197 Diarrhea, unspecified: Secondary | ICD-10-CM

## 2024-04-08 DIAGNOSIS — I10 Essential (primary) hypertension: Secondary | ICD-10-CM | POA: Diagnosis not present

## 2024-04-08 LAB — CBC WITH DIFFERENTIAL/PLATELET
Basophils Absolute: 0.1 K/uL (ref 0.0–0.1)
Basophils Relative: 1.1 % (ref 0.0–3.0)
Eosinophils Absolute: 0.2 K/uL (ref 0.0–0.7)
Eosinophils Relative: 4.2 % (ref 0.0–5.0)
HCT: 32.7 % — ABNORMAL LOW (ref 36.0–46.0)
Hemoglobin: 10.8 g/dL — ABNORMAL LOW (ref 12.0–15.0)
Lymphocytes Relative: 28.9 % (ref 12.0–46.0)
Lymphs Abs: 1.6 K/uL (ref 0.7–4.0)
MCHC: 33.1 g/dL (ref 30.0–36.0)
MCV: 91.6 fl (ref 78.0–100.0)
Monocytes Absolute: 0.5 K/uL (ref 0.1–1.0)
Monocytes Relative: 8.9 % (ref 3.0–12.0)
Neutro Abs: 3.2 K/uL (ref 1.4–7.7)
Neutrophils Relative %: 56.9 % (ref 43.0–77.0)
Platelets: 347 K/uL (ref 150.0–400.0)
RBC: 3.57 Mil/uL — ABNORMAL LOW (ref 3.87–5.11)
RDW: 15.9 % — ABNORMAL HIGH (ref 11.5–15.5)
WBC: 5.6 K/uL (ref 4.0–10.5)

## 2024-04-08 LAB — COMPREHENSIVE METABOLIC PANEL WITH GFR
ALT: 39 U/L — ABNORMAL HIGH (ref 0–35)
AST: 49 U/L — ABNORMAL HIGH (ref 0–37)
Albumin: 3.9 g/dL (ref 3.5–5.2)
Alkaline Phosphatase: 26 U/L — ABNORMAL LOW (ref 39–117)
BUN: 12 mg/dL (ref 6–23)
CO2: 31 meq/L (ref 19–32)
Calcium: 9.1 mg/dL (ref 8.4–10.5)
Chloride: 102 meq/L (ref 96–112)
Creatinine, Ser: 0.53 mg/dL (ref 0.40–1.20)
GFR: 87.89 mL/min (ref >60.00)
Glucose, Bld: 108 mg/dL — ABNORMAL HIGH (ref 70–99)
Potassium: 3.2 meq/L — ABNORMAL LOW (ref 3.5–5.1)
Sodium: 141 meq/L (ref 135–145)
Total Bilirubin: 0.6 mg/dL (ref 0.2–1.2)
Total Protein: 7.1 g/dL (ref 6.0–8.3)

## 2024-04-08 LAB — TSH: TSH: 0.86 u[IU]/mL (ref 0.35–5.50)

## 2024-04-08 NOTE — Assessment & Plan Note (Signed)
 Clinically stable.  No red flag signs or symptoms Benign abdominal examination.  Afebrile. Differential diagnosis discussed Recommend blood work and stool testing today Diet and nutrition discussed Recommend GI evaluation and possible colonoscopy. ED precautions given Advised to follow-up with PCP next week

## 2024-04-08 NOTE — Assessment & Plan Note (Signed)
 BP Readings from Last 3 Encounters:  04/08/24 128/72  02/21/24 100/70  02/19/24 118/71  Well-controlled hypertension On auscultation patient has irregular rhythm EKG shows sinus rhythm with occasional PACs.  No acute ischemic changes.  No changes compatible with electrolyte imbalances.

## 2024-04-08 NOTE — Progress Notes (Signed)
 Briana Schroeder 78 y.o.   Chief Complaint  Patient presents with   Diarrhea    Patient states she's been having dark green diarrhea for about 2-3 weeks. And states her mucus has been stuck at her throat says it gets sticky when she is eating and hard to swallow food. She says pepto helped a little with the diarrhea and has been taking mucinex  for the mucus states it helps temporally     HISTORY OF PRESENT ILLNESS: This is a 78 y.o. female complaining of dark green-colored diarrhea for about 2 to 3 weeks Denies abdominal pain.  Able to eat and drink.  Denies nausea or vomiting Also increased mucus in her throat No other associated symptoms No other complaints or medical concerns today.  Diarrhea  Pertinent negatives include no abdominal pain, chills, coughing, fever, headaches or vomiting.     Prior to Admission medications   Medication Sig Start Date End Date Taking? Authorizing Provider  amLODipine  (NORVASC ) 10 MG tablet Take 1 tablet by mouth once daily 03/10/24  Yes Norleen Lynwood ORN, MD  ascorbic acid (VITAMIN C) 500 MG tablet Take 500 mg by mouth daily.   Yes [provider]  cetirizine  (ZYRTEC ) 10 MG tablet Take 1 tablet by mouth once daily 04/02/24  Yes Norleen Lynwood ORN, MD  cyclobenzaprine  (FLEXERIL ) 5 MG tablet Take 1 tablet (5 mg total) by mouth at bedtime as needed for muscle spasms. 01/26/22  Yes Norleen Lynwood ORN, MD  losartan  (COZAAR ) 50 MG tablet Take 1 tablet by mouth once daily 11/18/23  Yes Norleen Lynwood ORN, MD  meloxicam  (MOBIC ) 15 MG tablet TAKE 1 TABLET BY MOUTH ONCE DAILY AS NEEDED 09/09/23  Yes Norleen Lynwood ORN, MD  Methylcobalamin (B-12) 5000 MCG TBDP Take 5,000 mcg by mouth daily.   Yes [provider]  pantoprazole  (PROTONIX ) 40 MG tablet Take 1 tablet by mouth once daily 03/30/24  Yes Norleen Lynwood ORN, MD  RESTASIS 0.05 % ophthalmic emulsion SMARTSIG:1 In Eye(s) Twice Daily 10/09/23  Yes [provider]  rosuvastatin  (CRESTOR ) 10 MG tablet Take 1 tablet (10  mg total) by mouth daily. 10/16/23  Yes Norleen Lynwood ORN, MD  traMADol  (ULTRAM ) 50 MG tablet TAKE 1 TABLET BY MOUTH EVERY 6 HOURS AS NEEDED 10/28/23  Yes Norleen Lynwood ORN, MD  VITAMIN D , CHOLECALCIFEROL, PO Take 1 tablet by mouth daily.   Yes [provider]  DULoxetine  (CYMBALTA ) 30 MG capsule Take 1 capsule (30 mg total) by mouth 2 (two) times daily. Patient not taking: Reported on 04/08/2024 02/19/24   Patel, Donika K, DO  predniSONE  (DELTASONE ) 10 MG tablet 1 tab by mouth once daily Patient not taking: Reported on 04/08/2024 10/16/23   Norleen Lynwood ORN, MD    No Known Allergies  Patient Active Problem List   Diagnosis Date Noted   Left leg pain 10/18/2023   PMR (polymyalgia rheumatica) 10/16/2023   Murmur 07/29/2023   Vitamin D  deficiency 07/29/2023   Bilateral impacted cerumen 07/25/2023   Rupture of left biceps tendon 06/13/2023   Rest pain of both lower extremities due to atherosclerosis (HCC) 06/13/2023   Right ear impacted cerumen 01/15/2023   Abnormal LFTs 01/15/2023   Polymyalgia 09/01/2022   Upper extremity weakness 07/30/2022   Chest pain 04/28/2022   Chronic pain of both shoulders 04/28/2022   Anemia 04/28/2022   B12 deficiency 04/28/2022   Pain and swelling of left lower leg 04/28/2022   Arthritis 03/31/2022   Muscle cramps 01/26/2022   Chest  pain, pleuritic 12/27/2021   RUQ pain 10/02/2021   Insomnia 08/09/2021   Left lumbar radiculopathy 05/12/2021   Heart murmur 05/12/2021   Pruritic rash 01/28/2021   Hives 08/22/2020   Traumatic incomplete tear of right rotator cuff 07/26/2020   Aortic atherosclerosis 04/07/2020   Right shoulder pain 04/07/2020   Lipoma of right thigh 10/08/2019   Foot cramps 10/08/2019   Rectocele 09/29/2019   Weight loss 09/05/2018   Hyperglycemia 06/17/2018   Left sided sciatica 06/27/2017   Left-sided chest pain 05/15/2017   Radiculitis of left cervical region 10/10/2016   Abnormal auditory perception of left ear 03/08/2016   Hearing  loss in right ear 12/15/2014   Elevated CK 12/02/2014   Bilateral hearing loss 05/21/2013   Hypokalemia 11/17/2012   Paresthesia 05/19/2012   Abnormal LFTs (liver function tests) 05/19/2012   Hearing loss, bilateral 05/19/2012   Preventative health care 05/15/2011   Left shoulder pain 05/15/2011   Osteopenia    Fibroid    Hearing loss sensory, bilateral 02/27/2011   EPIGASTRIC PAIN 01/09/2010   GERD 12/06/2009   DIZZINESS 12/05/2009   Hyperlipidemia 09/06/2009   FATIGUE 09/06/2009   HOARSENESS 09/06/2009   Allergic rhinitis 08/02/2009   PULMONARY SARCOIDOSIS 10/18/2008   Anxiety state 10/18/2008   Essential hypertension 10/18/2008   IBS 10/18/2008   History of colonic polyps 10/18/2008    Past Medical History:  Diagnosis Date   ALLERGIC RHINITIS 08/02/2009   Allergy    ANXIETY 10/18/2008   ARM PAIN, RIGHT 04/04/2009   Arthritis    BACK PAIN 08/02/2009   CHEST PAIN 12/05/2009   COLONIC POLYPS, HX OF 10/18/2008   DIZZINESS 12/05/2009   EPIGASTRIC PAIN 01/09/2010   FATIGUE 09/06/2009   GERD 12/06/2009   HOARSENESS 09/06/2009   HYPERLIPIDEMIA 10/18/2008   HYPERTENSION 10/18/2008   IBS 10/18/2008   MYALGIA 03/21/2009   Osteopenia 12/2018   T score -1.7 FRAX 12% / 2.5% table from prior DEXA 2018   PULMONARY SARCOIDOSIS 10/18/2008   SINUSITIS- ACUTE-NOS 03/21/2009   VOCAL CORD NODULE 09/06/2009    Past Surgical History:  Procedure Laterality Date   ABDOMINAL HYSTERECTOMY  08/1992   TAH-DR G FOR FIBROIDS AND MENORRHAGIA   COLONOSCOPY  11-24-2009   3 polyps, +TA and HPP   hx of vocal cord nodules     MUSCLE BIOPSY Right 08/23/2022   Procedure: RIGHT DELTOID MUSCLE BIOPSY;  Surgeon: Aron Shoulders, MD;  Location: [facility];  Service: General;  Laterality: Right;   s/p right finger tendon surgury     UPPER GASTROINTESTINAL ENDOSCOPY  01-08-2010   normal    Social History   Socioeconomic History   Marital status: Married    Spouse name: Lynwood   Number of children:  2   Years of education: Not on file   Highest education level: Associate degree: occupational, Scientist, product/process development, or vocational program  Occupational History   Occupation: Water engineer  Tobacco Use   Smoking status: Never   Smokeless tobacco: Never  Vaping Use   Vaping status: Never Used  Substance and Sexual Activity   Alcohol use: No    Alcohol/week: 0.0 standard drinks of alcohol   Drug use: No   Sexual activity: Not Currently    Birth control/protection: Surgical    Comment: 1st intercourse 20 yo-1 partner  Other Topics Concern   Not on file  Social History Narrative   Right Handed    Lives in a two story home. Lives with husband   Social  Drivers of Health   Financial Resource Strain: Low Risk  (02/21/2024)   Overall Financial Resource Strain (CARDIA)    Difficulty of Paying Living Expenses: Not hard at all  Food Insecurity: No Food Insecurity (02/21/2024)   Hunger Vital Sign    Worried About Running Out of Food in the Last Year: Never true    Ran Out of Food in the Last Year: Never true  Transportation Needs: No Transportation Needs (02/21/2024)   PRAPARE - Administrator, Civil Service (Medical): No    Lack of Transportation (Non-Medical): No  Physical Activity: Inactive (02/21/2024)   Exercise Vital Sign    Days of Exercise per Week: 0 days    Minutes of Exercise per Session: 0 min  Stress: No Stress Concern Present (02/21/2024)   [employer] of Occupational Health - Occupational Stress Questionnaire    Feeling of Stress: Not at all  Social Connections: Socially Integrated (02/21/2024)   Social Connection and Isolation Panel    Frequency of Communication with Friends and Family: More than three times a week    Frequency of Social Gatherings with Friends and Family: Never    Attends Religious Services: More than 4 times per year    Active Member of [employer] or Organizations: Yes    Attends Banker Meetings: 1 to 4 times per year     Marital Status: Married  Catering manager Violence: Not At Risk (02/21/2024)   Humiliation, Afraid, Rape, and Kick questionnaire    Fear of Current or Ex-Partner: No    Emotionally Abused: No    Physically Abused: No    Sexually Abused: No    Family History  Problem Relation Age of Onset   Diabetes Mother    Hypertension Mother    Breast cancer Mother 22   Hypertension Sister    Colon cancer Neg Hx    Colon polyps Neg Hx    Rectal cancer Neg Hx    Stomach cancer Neg Hx    Esophageal cancer Neg Hx      Review of Systems  Constitutional: Negative.  Negative for chills and fever.  HENT: Negative.  Negative for congestion and sore throat.   Respiratory: Negative.  Negative for cough and shortness of breath.   Cardiovascular:  Negative for chest pain and palpitations.  Gastrointestinal:  Positive for diarrhea. Negative for abdominal pain, nausea and vomiting.  Genitourinary: Negative.  Negative for dysuria and hematuria.  Skin: Negative.  Negative for rash.  Neurological: Negative.  Negative for dizziness and headaches.  All other systems reviewed and are negative.   Vitals:   04/08/24 0902  BP: 128/72  Pulse: 82  Temp: 98.6 F (37 C)  SpO2: 99%    Physical Exam Vitals reviewed.  Constitutional:      Appearance: Normal appearance.  HENT:     Head: Normocephalic.     Mouth/Throat:     Mouth: Mucous membranes are moist.     Pharynx: Oropharynx is clear.  Eyes:     Extraocular Movements: Extraocular movements intact.     Pupils: Pupils are equal, round, and reactive to light.  Cardiovascular:     Rate and Rhythm: Normal rate. Rhythm irregular.     Pulses: Normal pulses.     Heart sounds: Normal heart sounds.  Pulmonary:     Effort: Pulmonary effort is normal.     Breath sounds: Normal breath sounds.  Abdominal:     Palpations: Abdomen is soft.  Tenderness: There is no abdominal tenderness.  Musculoskeletal:     Cervical back: No tenderness.   Lymphadenopathy:     Cervical: No cervical adenopathy.  Skin:    General: Skin is warm and dry.     Capillary Refill: Capillary refill takes less than 2 seconds.  Neurological:     General: No focal deficit present.     Mental Status: She is alert and oriented to person, place, and time.  Psychiatric:        Mood and Affect: Mood normal.        Behavior: Behavior normal.    EKG: Normal sinus rhythm with a ventricular rate of 78 and occasional PACs.  No acute ischemic changes  ASSESSMENT & PLAN: A total of 40 minutes was spent with the patient and counseling/coordination of care regarding preparing for this visit, review of most recent office visit notes, review of multiple chronic medical conditions and their management, differential diagnosis of the diarrhea and its management, need for workup, review of all medications, review of most recent bloodwork results, review of health maintenance items, education on nutrition, prognosis, documentation, and need for follow up.   Problem List Items Addressed This Visit       Cardiovascular and Mediastinum   Essential hypertension   BP Readings from Last 3 Encounters:  04/08/24 128/72  02/21/24 100/70  02/19/24 118/71  Well-controlled hypertension On auscultation patient has irregular rhythm EKG shows sinus rhythm with occasional PACs.  No acute ischemic changes.  No changes compatible with electrolyte imbalances.       Relevant Orders   EKG 12-Lead     Other   Diarrhea - Primary   Clinically stable.  No red flag signs or symptoms Benign abdominal examination.  Afebrile. Differential diagnosis discussed Recommend blood work and stool testing today Diet and nutrition discussed Recommend GI evaluation and possible colonoscopy. ED precautions given Advised to follow-up with PCP next week      Relevant Orders   CBC with Differential/Platelet   Comprehensive metabolic panel with GFR   TSH   GI Profile, Stool, PCR      Patient Instructions  Diarrhea, Adult Diarrhea is when you pass loose and sometimes watery poop (stool) often. Diarrhea can make you feel weak and cause you to lose water in your body (get dehydrated). Losing water in your body can cause you to: Feel tired and thirsty. Have a dry mouth. Go pee (urinate) less often. Diarrhea often lasts 2-3 days. It can last longer if it is a sign of something more serious. Be sure to treat your diarrhea as told by your doctor. Follow these instructions at home: Eating and drinking     Follow these instructions as told by your doctor: Take an ORS (oral rehydration solution). This is a drink that helps you replace fluids and minerals your body lost. It is sold at pharmacies and stores. Drink enough fluid to keep your pee (urine) pale yellow. Drink fluids such as: Water. You can also get fluids by sucking on ice chips. Diluted fruit juice. Low-calorie sports drinks. Milk. Avoid drinking fluids that have a lot of sugar or caffeine in them. These include soda, energy drinks, and regular sports drinks. Avoid alcohol. Eat bland, easy-to-digest foods in small amounts as you are able. These foods include: Bananas. Applesauce. Rice. Low-fat (lean) meats. Toast. Crackers. Avoid spicy or fatty foods.  Medicines Take over-the-counter and prescription medicines only as told by your doctor. If you were prescribed antibiotics, take them  as told by your doctor. Do not stop taking them even if you start to feel better. General instructions  Wash your hands often using soap and water for 20 seconds. If soap and water are not available, use hand sanitizer. Others in your home should wash their hands as well. Wash your hands: After using the toilet or changing a diaper. Before preparing, cooking, or serving food. While caring for a sick person. While visiting someone in a hospital. Rest at home while you get better. Take a warm bath to help with any  burning or pain from having diarrhea. Watch your condition for any changes. Contact a doctor if: You have a fever. Your diarrhea gets worse. You have new symptoms. You vomit every time you eat or drink. You feel light-headed, dizzy, or you have a headache. You have muscle cramps. You have signs of losing too much water in your body, such as: Dark pee, very little pee, or no pee. Cracked lips. Dry mouth. Sunken eyes. Sleepiness. Weakness. You have bloody or black poop or poop that looks like tar. You have very bad pain, cramping, or bloating in your belly (abdomen). Your skin feels cold and clammy. You feel confused. Get help right away if: You have chest pain. Your heart is beating very quickly. You have trouble breathing or you are breathing very quickly. You feel very weak or you faint. These symptoms may be an emergency. Get help right away. Call 911. Do not wait to see if the symptoms will go away. Do not drive yourself to the hospital. This information is not intended to replace advice given to you by your health care provider. Make sure you discuss any questions you have with your health care provider. Document Revised: 12/05/2021 Document Reviewed: 12/05/2021 Elsevier Patient Education  2024 Elsevier Inc.    Emil Schaumann, MD [facility]

## 2024-04-08 NOTE — Patient Instructions (Signed)
 Diarrhea, Adult Diarrhea is when you pass loose and sometimes watery poop (stool) often. Diarrhea can make you feel weak and cause you to lose water in your body (get dehydrated). Losing water in your body can cause you to: Feel tired and thirsty. Have a dry mouth. Go pee (urinate) less often. Diarrhea often lasts 2-3 days. It can last longer if it is a sign of something more serious. Be sure to treat your diarrhea as told by your doctor. Follow these instructions at home: Eating and drinking     Follow these instructions as told by your doctor: Take an ORS (oral rehydration solution). This is a drink that helps you replace fluids and minerals your body lost. It is sold at pharmacies and stores. Drink enough fluid to keep your pee (urine) pale yellow. Drink fluids such as: Water. You can also get fluids by sucking on ice chips. Diluted fruit juice. Low-calorie sports drinks. Milk. Avoid drinking fluids that have a lot of sugar or caffeine in them. These include soda, energy drinks, and regular sports drinks. Avoid alcohol. Eat bland, easy-to-digest foods in small amounts as you are able. These foods include: Bananas. Applesauce. Rice. Low-fat (lean) meats. Toast. Crackers. Avoid spicy or fatty foods.  Medicines Take over-the-counter and prescription medicines only as told by your doctor. If you were prescribed antibiotics, take them as told by your doctor. Do not stop taking them even if you start to feel better. General instructions  Wash your hands often using soap and water for 20 seconds. If soap and water are not available, use hand sanitizer. Others in your home should wash their hands as well. Wash your hands: After using the toilet or changing a diaper. Before preparing, cooking, or serving food. While caring for a sick person. While visiting someone in a hospital. Rest at home while you get better. Take a warm bath to help with any burning or pain from having  diarrhea. Watch your condition for any changes. Contact a doctor if: You have a fever. Your diarrhea gets worse. You have new symptoms. You vomit every time you eat or drink. You feel light-headed, dizzy, or you have a headache. You have muscle cramps. You have signs of losing too much water in your body, such as: Dark pee, very little pee, or no pee. Cracked lips. Dry mouth. Sunken eyes. Sleepiness. Weakness. You have bloody or black poop or poop that looks like tar. You have very bad pain, cramping, or bloating in your belly (abdomen). Your skin feels cold and clammy. You feel confused. Get help right away if: You have chest pain. Your heart is beating very quickly. You have trouble breathing or you are breathing very quickly. You feel very weak or you faint. These symptoms may be an emergency. Get help right away. Call 911. Do not wait to see if the symptoms will go away. Do not drive yourself to the hospital. This information is not intended to replace advice given to you by your health care provider. Make sure you discuss any questions you have with your health care provider. Document Revised: 12/05/2021 Document Reviewed: 12/05/2021 Elsevier Patient Education  2024 [employer].

## 2024-04-09 DIAGNOSIS — R2689 Other abnormalities of gait and mobility: Secondary | ICD-10-CM | POA: Diagnosis not present

## 2024-04-09 DIAGNOSIS — M545 Low back pain, unspecified: Secondary | ICD-10-CM | POA: Diagnosis not present

## 2024-04-09 DIAGNOSIS — M6281 Muscle weakness (generalized): Secondary | ICD-10-CM | POA: Diagnosis not present

## 2024-04-10 LAB — GI PROFILE, STOOL, PCR

## 2024-04-14 DIAGNOSIS — R2689 Other abnormalities of gait and mobility: Secondary | ICD-10-CM | POA: Diagnosis not present

## 2024-04-14 DIAGNOSIS — M6281 Muscle weakness (generalized): Secondary | ICD-10-CM | POA: Diagnosis not present

## 2024-04-14 DIAGNOSIS — M545 Low back pain, unspecified: Secondary | ICD-10-CM | POA: Diagnosis not present

## 2024-04-17 ENCOUNTER — Encounter: Payer: Self-pay | Admitting: Internal Medicine

## 2024-04-17 ENCOUNTER — Ambulatory Visit (INDEPENDENT_AMBULATORY_CARE_PROVIDER_SITE_OTHER): Admitting: Internal Medicine

## 2024-04-17 ENCOUNTER — Other Ambulatory Visit: Payer: Self-pay | Admitting: Internal Medicine

## 2024-04-17 ENCOUNTER — Other Ambulatory Visit: Payer: Self-pay | Admitting: Neurology

## 2024-04-17 ENCOUNTER — Ambulatory Visit: Payer: Self-pay | Admitting: Internal Medicine

## 2024-04-17 VITALS — BP 126/82 | HR 61 | Temp 98.2°F | Ht 61.5 in

## 2024-04-17 DIAGNOSIS — E7800 Pure hypercholesterolemia, unspecified: Secondary | ICD-10-CM

## 2024-04-17 DIAGNOSIS — D649 Anemia, unspecified: Secondary | ICD-10-CM

## 2024-04-17 DIAGNOSIS — E538 Deficiency of other specified B group vitamins: Secondary | ICD-10-CM | POA: Diagnosis not present

## 2024-04-17 DIAGNOSIS — W19XXXA Unspecified fall, initial encounter: Secondary | ICD-10-CM | POA: Diagnosis not present

## 2024-04-17 DIAGNOSIS — I10 Essential (primary) hypertension: Secondary | ICD-10-CM | POA: Diagnosis not present

## 2024-04-17 DIAGNOSIS — R739 Hyperglycemia, unspecified: Secondary | ICD-10-CM | POA: Diagnosis not present

## 2024-04-17 DIAGNOSIS — Z01818 Encounter for other preprocedural examination: Secondary | ICD-10-CM | POA: Diagnosis not present

## 2024-04-17 DIAGNOSIS — E559 Vitamin D deficiency, unspecified: Secondary | ICD-10-CM

## 2024-04-17 LAB — HEPATIC FUNCTION PANEL
ALT: 37 U/L — ABNORMAL HIGH (ref 0–35)
AST: 43 U/L — ABNORMAL HIGH (ref 0–37)
Albumin: 3.8 g/dL (ref 3.5–5.2)
Alkaline Phosphatase: 29 U/L — ABNORMAL LOW (ref 39–117)
Bilirubin, Direct: 0.2 mg/dL (ref 0.0–0.3)
Total Bilirubin: 0.6 mg/dL (ref 0.2–1.2)
Total Protein: 7.1 g/dL (ref 6.0–8.3)

## 2024-04-17 LAB — LIPID PANEL
Cholesterol: 121 mg/dL (ref 0–200)
HDL: 58.6 mg/dL (ref >39.00)
LDL Cholesterol: 50 mg/dL (ref 0–99)
NonHDL: 62.85
Total CHOL/HDL Ratio: 2
Triglycerides: 66 mg/dL (ref 0.0–149.0)
VLDL: 13.2 mg/dL (ref 0.0–40.0)

## 2024-04-17 LAB — CBC WITH DIFFERENTIAL/PLATELET
Basophils Absolute: 0.1 K/uL (ref 0.0–0.1)
Basophils Relative: 1.4 % (ref 0.0–3.0)
Eosinophils Absolute: 0.2 K/uL (ref 0.0–0.7)
Eosinophils Relative: 5.1 % — ABNORMAL HIGH (ref 0.0–5.0)
HCT: 33.4 % — ABNORMAL LOW (ref 36.0–46.0)
Hemoglobin: 11.2 g/dL — ABNORMAL LOW (ref 12.0–15.0)
Lymphocytes Relative: 24.4 % (ref 12.0–46.0)
Lymphs Abs: 1 K/uL (ref 0.7–4.0)
MCHC: 33.4 g/dL (ref 30.0–36.0)
MCV: 91.4 fl (ref 78.0–100.0)
Monocytes Absolute: 0.3 K/uL (ref 0.1–1.0)
Monocytes Relative: 7.7 % (ref 3.0–12.0)
Neutro Abs: 2.6 K/uL (ref 1.4–7.7)
Neutrophils Relative %: 61.4 % (ref 43.0–77.0)
Platelets: 332 K/uL (ref 150.0–400.0)
RBC: 3.65 Mil/uL — ABNORMAL LOW (ref 3.87–5.11)
RDW: 15.7 % — ABNORMAL HIGH (ref 11.5–15.5)
WBC: 4.3 K/uL (ref 4.0–10.5)

## 2024-04-17 LAB — URINALYSIS, ROUTINE W REFLEX MICROSCOPIC
Bilirubin Urine: NEGATIVE
Ketones, ur: NEGATIVE
Nitrite: NEGATIVE
Specific Gravity, Urine: 1.025 (ref 1.000–1.030)
Urine Glucose: NEGATIVE
Urobilinogen, UA: 1 (ref 0.0–1.0)
pH: 6 (ref 5.0–8.0)

## 2024-04-17 LAB — IBC PANEL
Iron: 44 ug/dL (ref 42–145)
Saturation Ratios: 19.5 % — ABNORMAL LOW (ref 20.0–50.0)
TIBC: 225.4 ug/dL — ABNORMAL LOW (ref 250.0–450.0)
Transferrin: 161 mg/dL — ABNORMAL LOW (ref 212.0–360.0)

## 2024-04-17 LAB — TSH: TSH: 0.66 u[IU]/mL (ref 0.35–5.50)

## 2024-04-17 LAB — VITAMIN D 25 HYDROXY (VIT D DEFICIENCY, FRACTURES): VITD: 68.14 ng/mL (ref 30.00–100.00)

## 2024-04-17 LAB — BASIC METABOLIC PANEL WITH GFR
BUN: 11 mg/dL (ref 6–23)
CO2: 30 meq/L (ref 19–32)
Calcium: 9.3 mg/dL (ref 8.4–10.5)
Chloride: 103 meq/L (ref 96–112)
Creatinine, Ser: 0.58 mg/dL (ref 0.40–1.20)
GFR: 85.99 mL/min (ref >60.00)
Glucose, Bld: 94 mg/dL (ref 70–99)
Potassium: 3.3 meq/L — ABNORMAL LOW (ref 3.5–5.1)
Sodium: 142 meq/L (ref 135–145)

## 2024-04-17 LAB — HEMOGLOBIN A1C: Hgb A1c MFr Bld: 6 % (ref 4.6–6.5)

## 2024-04-17 LAB — MICROALBUMIN / CREATININE URINE RATIO
Creatinine,U: 171.1 mg/dL
Microalb Creat Ratio: 25.1 mg/g (ref 0.0–30.0)
Microalb, Ur: 4.3 mg/dL — ABNORMAL HIGH (ref 0.0–1.9)

## 2024-04-17 LAB — FERRITIN: Ferritin: 198.7 ng/mL (ref 10.0–291.0)

## 2024-04-17 LAB — VITAMIN B12: Vitamin B-12: >1500 pg/mL — ABNORMAL HIGH (ref 211–911)

## 2024-04-17 MED ORDER — CEPHALEXIN 500 MG PO CAPS: 500.0000 mg | ORAL_CAPSULE | Freq: Three times a day (TID) | ORAL | 0 refills | Status: DC

## 2024-04-17 NOTE — Patient Instructions (Signed)
 Please consider using your husbands walker at home for safety and balance, at least until the right knee surgury is done  Please continue all other medications as before, and refills have been done if requested.  Please have the pharmacy call with any other refills you may need.  Please continue your efforts at being more active, low cholesterol diet, and weight control.  Please keep your appointments with your specialists as you may have planned  Please go to the LAB at the blood drawing area for the tests to be done  You will be contacted by phone if any changes need to be made immediately.  Otherwise, you will receive a letter about your results with an explanation, but please check with MyChart first.  Please make an Appointment to return in 6 months, or sooner if needed

## 2024-04-17 NOTE — Assessment & Plan Note (Signed)
 BP Readings from Last 3 Encounters:  04/17/24 126/82  04/08/24 128/72  02/21/24 100/70   Stable, pt to continue medical treatment norvasc  10 mg every day, losartan  50 qd

## 2024-04-17 NOTE — Assessment & Plan Note (Signed)
 Pt cleared for surgury right knee TKR nov 11, no meds to hold

## 2024-04-17 NOTE — Assessment & Plan Note (Signed)
 No overt bleeding, ok for cbc and iron with labs

## 2024-04-17 NOTE — Assessment & Plan Note (Signed)
 Lab Results  Component Value Date   LDLCALC 50 04/17/2024   Stable, pt to continue current statin crestor  10 mg qd

## 2024-04-17 NOTE — Assessment & Plan Note (Signed)
 Last vitamin D  Lab Results  Component Value Date   VD25OH 68.14 04/17/2024   Stable, cont oral replacement

## 2024-04-17 NOTE — Assessment & Plan Note (Signed)
 Lab Results  Component Value Date   VITAMINB12 >1500 (H) 04/17/2024   Stable, cont oral replacement - b12 1000 mcg qd

## 2024-04-17 NOTE — Progress Notes (Signed)
 Patient ID: ZYLPHA POYNOR, female   DOB: [birth year], 78 y.o.   MRN: [id number]        Chief Complaint: follow up HTN, HLD and hyperglycemia, low vit d, end stage right knee DJD       HPI:  Briana Schroeder is a 78 y.o. female here with c/o persistent right knee pain with intermittent swelling and no giveaways or falls except incidentally falling today with giveway right knee, fortunately denies any pain, did not hit head, ended up on right side.  Fortunately she has right knee TKR for nov 11.   No falls at home.  Does have husband walker she could use.  Also due for oct 31 left cataract removal, then right eye in dec 2025.  No overt bleeding bruising.         Wt Readings from Last 3 Encounters:  04/08/24 115 lb (52.2 kg)  02/21/24 116 lb (52.6 kg)  02/19/24 116 lb (52.6 kg)   BP Readings from Last 3 Encounters:  04/17/24 126/82  04/08/24 128/72  02/21/24 100/70         Past Medical History:  Diagnosis Date   ALLERGIC RHINITIS 08/02/2009   Allergy    ANXIETY 10/18/2008   ARM PAIN, RIGHT 04/04/2009   Arthritis    BACK PAIN 08/02/2009   CHEST PAIN 12/05/2009   COLONIC POLYPS, HX OF 10/18/2008   DIZZINESS 12/05/2009   EPIGASTRIC PAIN 01/09/2010   FATIGUE 09/06/2009   GERD 12/06/2009   HOARSENESS 09/06/2009   HYPERLIPIDEMIA 10/18/2008   HYPERTENSION 10/18/2008   IBS 10/18/2008   MYALGIA 03/21/2009   Osteopenia 12/2018   T score -1.7 FRAX 12% / 2.5% table from prior DEXA 2018   PULMONARY SARCOIDOSIS 10/18/2008   SINUSITIS- ACUTE-NOS 03/21/2009   VOCAL CORD NODULE 09/06/2009   Past Surgical History:  Procedure Laterality Date   ABDOMINAL HYSTERECTOMY  08/1992   TAH-DR G FOR FIBROIDS AND MENORRHAGIA   COLONOSCOPY  11-24-2009   3 polyps, +TA and HPP   hx of vocal cord nodules     MUSCLE BIOPSY Right 08/23/2022   Procedure: RIGHT DELTOID MUSCLE BIOPSY;  Surgeon: Aron Shoulders, MD;  Location: [facility];  Service: General;  Laterality: Right;   s/p right finger tendon surgury      UPPER GASTROINTESTINAL ENDOSCOPY  01-08-2010   normal    reports that she has never smoked. She has never used smokeless tobacco. She reports that she does not drink alcohol and does not use drugs. family history includes Breast cancer (age of onset: 61) in her mother; Diabetes in her mother; Hypertension in her mother and sister. No Known Allergies Current Outpatient Medications on File Prior to Visit  Medication Sig Dispense Refill   amLODipine  (NORVASC ) 10 MG tablet Take 1 tablet by mouth once daily 90 tablet 0   ascorbic acid (VITAMIN C) 500 MG tablet Take 500 mg by mouth daily.     cetirizine  (ZYRTEC ) 10 MG tablet Take 1 tablet by mouth once daily 90 tablet 0   cyclobenzaprine  (FLEXERIL ) 5 MG tablet Take 1 tablet (5 mg total) by mouth at bedtime as needed for muscle spasms. 90 tablet 3   losartan  (COZAAR ) 50 MG tablet Take 1 tablet by mouth once daily 90 tablet 2   meloxicam  (MOBIC ) 15 MG tablet TAKE 1 TABLET BY MOUTH ONCE DAILY AS NEEDED 90 tablet 3   Methylcobalamin (B-12) 5000 MCG TBDP Take 5,000 mcg by mouth daily.     pantoprazole  (PROTONIX ) 40 MG  tablet Take 1 tablet by mouth once daily 90 tablet 0   RESTASIS 0.05 % ophthalmic emulsion SMARTSIG:1 In Eye(s) Twice Daily     rosuvastatin  (CRESTOR ) 10 MG tablet Take 1 tablet (10 mg total) by mouth daily. 90 tablet 3   traMADol  (ULTRAM ) 50 MG tablet TAKE 1 TABLET BY MOUTH EVERY 6 HOURS AS NEEDED 60 tablet 2   VITAMIN D , CHOLECALCIFEROL, PO Take 1 tablet by mouth daily.     DULoxetine  (CYMBALTA ) 30 MG capsule Take 1 capsule (30 mg total) by mouth 2 (two) times daily. (Patient not taking: Reported on 04/17/2024) 180 capsule 3   predniSONE  (DELTASONE ) 10 MG tablet 1 tab by mouth once daily (Patient not taking: Reported on 04/17/2024) 30 tablet 2   No current facility-administered medications on file prior to visit.        ROS:  All others reviewed and negative.  Objective        PE:  BP 126/82 (BP Location: Left Arm, Patient  Position: Sitting, Cuff Size: Normal)   Pulse 61   Temp 98.2 F (36.8 C) (Oral)   Ht 5' 1.5 (1.562 m)   SpO2 99%   BMI 21.38 kg/m                 Constitutional: Pt appears in NAD               HENT: Head: NCAT.                Right Ear: External ear normal.                 Left Ear: External ear normal.                Eyes: . Pupils are equal, round, and reactive to light. Conjunctivae and EOM are normal               Nose: without d/c or deformity               Neck: Neck supple. Gross normal ROM               Cardiovascular: Normal rate and regular rhythm.                 Pulmonary/Chest: Effort normal and breath sounds without rales or wheezing.                Abd:  Soft, NT, ND, + BS, no organomegaly               Neurological: Pt is alert. At baseline orientation, motor grossly intact               Skin: Skin is warm. No rashes, no other new lesions, LE edema - none; right knee without effusion or laxity               Psychiatric: Pt behavior is normal without agitation   Micro: none  Cardiac tracings I have personally interpreted today:  none  Pertinent Radiological findings (summarize): none   Lab Results  Component Value Date   WBC 4.3 04/17/2024   HGB 11.2 (L) 04/17/2024   HCT 33.4 (L) 04/17/2024   PLT 332.0 04/17/2024   GLUCOSE 94 04/17/2024   CHOL 121 04/17/2024   TRIG 66.0 04/17/2024   HDL 58.60 04/17/2024   LDLDIRECT 83.3 11/17/2012   LDLCALC 50 04/17/2024   ALT 37 (H) 04/17/2024   AST 43 (H) 04/17/2024   NA 142 04/17/2024  K 3.3 (L) 04/17/2024   CL 103 04/17/2024   CREATININE 0.58 04/17/2024   BUN 11 04/17/2024   CO2 30 04/17/2024   TSH 0.66 04/17/2024   HGBA1C 6.0 04/17/2024   Assessment/Plan:  Briana Schroeder is a 78 y.o. Black or African American [2] female with  has a past medical history of ALLERGIC RHINITIS (08/02/2009), Allergy, ANXIETY (10/18/2008), ARM PAIN, RIGHT (04/04/2009), Arthritis, BACK PAIN (08/02/2009), CHEST PAIN (12/05/2009),  COLONIC POLYPS, HX OF (10/18/2008), DIZZINESS (12/05/2009), EPIGASTRIC PAIN (01/09/2010), FATIGUE (09/06/2009), GERD (12/06/2009), HOARSENESS (09/06/2009), HYPERLIPIDEMIA (10/18/2008), HYPERTENSION (10/18/2008), IBS (10/18/2008), MYALGIA (03/21/2009), Osteopenia (12/2018), PULMONARY SARCOIDOSIS (10/18/2008), SINUSITIS- ACUTE-NOS (03/21/2009), and VOCAL CORD NODULE (09/06/2009).  Vitamin D  deficiency Last vitamin D  Lab Results  Component Value Date   VD25OH 68.14 04/17/2024   Stable, cont oral replacement   Hyperlipidemia Lab Results  Component Value Date   LDLCALC 50 04/17/2024   Stable, pt to continue current statin crestor  10 mg qd   Hyperglycemia Lab Results  Component Value Date   HGBA1C 6.0 04/17/2024   Stable, pt to continue current medical treatment  - diet, wt control   Essential hypertension BP Readings from Last 3 Encounters:  04/17/24 126/82  04/08/24 128/72  02/21/24 100/70   Stable, pt to continue medical treatment norvasc  10 mg every day, losartan  50 qd   B12 deficiency Lab Results  Component Value Date   VITAMINB12 >1500 (H) 04/17/2024   Stable, cont oral replacement - b12 1000 mcg qd   Anemia No overt bleeding, ok for cbc and iron with labs  Preop exam for internal medicine Pt cleared for surgury right knee TKR nov 11, no meds to hold  Fall Incidental today, possibly related to right knee arthritis, no apparent injury or pain, did not hit head,  to f/u any worsening symptoms or concerns, and pt encouraged to use husband walker at home even before her surgury  Followup: Return in about 6 months (around 10/16/2024).  Lynwood Rush, MD 04/17/2024 1:05 PM  [facility]

## 2024-04-17 NOTE — Assessment & Plan Note (Addendum)
 Incidental today, possibly related to right knee arthritis, no apparent injury or pain, did not hit head,  to f/u any worsening symptoms or concerns, and pt encouraged to use husband walker at home even before her surgury

## 2024-04-17 NOTE — Assessment & Plan Note (Signed)
 Lab Results  Component Value Date   HGBA1C 6.0 04/17/2024   Stable, pt to continue current medical treatment  - diet, wt control

## 2024-04-23 ENCOUNTER — Telehealth: Payer: Self-pay | Admitting: Neurology

## 2024-04-23 NOTE — Telephone Encounter (Signed)
 Pt is asking for DULoxetine  (CYMBALTA ) 30 MG capsule Pt would to get refil and continue with Rx [id number] - [place] ([place]), [place] - [address]

## 2024-04-23 NOTE — Telephone Encounter (Signed)
 Called [facility] and checked on Cymbalta  30mg  capsule 2 times a day prescription. Was informed that they do have it and they have processed the refill for patient. Will be ready on an hour  Called patient and left a detailed message per DPR that her prescription will be ready in about an hour at her Pharmacy. Left contact information incase patient had any questions or concerns.

## 2024-05-01 DIAGNOSIS — H2511 Age-related nuclear cataract, right eye: Secondary | ICD-10-CM | POA: Diagnosis not present

## 2024-05-01 DIAGNOSIS — H5371 Glare sensitivity: Secondary | ICD-10-CM | POA: Diagnosis not present

## 2024-05-01 DIAGNOSIS — H2512 Age-related nuclear cataract, left eye: Secondary | ICD-10-CM | POA: Diagnosis not present

## 2024-05-04 ENCOUNTER — Other Ambulatory Visit: Payer: Self-pay | Admitting: Internal Medicine

## 2024-05-04 ENCOUNTER — Encounter: Payer: Self-pay | Admitting: Radiology

## 2024-05-04 ENCOUNTER — Telehealth: Payer: Self-pay

## 2024-05-04 NOTE — Telephone Encounter (Signed)
 Copied from CRM [id number]. Topic: Clinical - Medication Question >> May 04, 2024 10:13 AM Maisie C wrote: Reason for CRM: pt called to ask if pcp wants her to continue taking cephalexin since he didn't give her refills on it. Please call and advise.

## 2024-05-07 ENCOUNTER — Other Ambulatory Visit: Payer: Self-pay | Admitting: Internal Medicine

## 2024-05-08 ENCOUNTER — Ambulatory Visit (INDEPENDENT_AMBULATORY_CARE_PROVIDER_SITE_OTHER): Admitting: Family Medicine

## 2024-05-08 ENCOUNTER — Ambulatory Visit: Payer: Self-pay

## 2024-05-08 ENCOUNTER — Other Ambulatory Visit: Payer: Self-pay | Admitting: Internal Medicine

## 2024-05-08 ENCOUNTER — Encounter: Payer: Self-pay | Admitting: Family Medicine

## 2024-05-08 VITALS — BP 134/80 | HR 62 | Temp 97.7°F | Ht 61.5 in | Wt 111.4 lb

## 2024-05-08 DIAGNOSIS — R197 Diarrhea, unspecified: Secondary | ICD-10-CM

## 2024-05-08 DIAGNOSIS — N3000 Acute cystitis without hematuria: Secondary | ICD-10-CM

## 2024-05-08 DIAGNOSIS — R634 Abnormal weight loss: Secondary | ICD-10-CM | POA: Diagnosis not present

## 2024-05-08 LAB — COMPREHENSIVE METABOLIC PANEL WITH GFR
ALT: 37 U/L — ABNORMAL HIGH (ref 0–35)
AST: 51 U/L — ABNORMAL HIGH (ref 0–37)
Albumin: 4 g/dL (ref 3.5–5.2)
Alkaline Phosphatase: 30 U/L — ABNORMAL LOW (ref 39–117)
BUN: 14 mg/dL (ref 6–23)
CO2: 28 meq/L (ref 19–32)
Calcium: 9.7 mg/dL (ref 8.4–10.5)
Chloride: 100 meq/L (ref 96–112)
Creatinine, Ser: 0.52 mg/dL (ref 0.40–1.20)
GFR: 88.25 mL/min (ref >60.00)
Glucose, Bld: 85 mg/dL (ref 70–99)
Potassium: 3.8 meq/L (ref 3.5–5.1)
Sodium: 138 meq/L (ref 135–145)
Total Bilirubin: 0.5 mg/dL (ref 0.2–1.2)
Total Protein: 7.3 g/dL (ref 6.0–8.3)

## 2024-05-08 LAB — CBC WITH DIFFERENTIAL/PLATELET
Basophils Absolute: 0.1 K/uL (ref 0.0–0.1)
Basophils Relative: 1.2 % (ref 0.0–3.0)
Eosinophils Absolute: 0.1 K/uL (ref 0.0–0.7)
Eosinophils Relative: 3.4 % (ref 0.0–5.0)
HCT: 35.5 % — ABNORMAL LOW (ref 36.0–46.0)
Hemoglobin: 11.6 g/dL — ABNORMAL LOW (ref 12.0–15.0)
Lymphocytes Relative: 30.1 % (ref 12.0–46.0)
Lymphs Abs: 1.3 K/uL (ref 0.7–4.0)
MCHC: 32.8 g/dL (ref 30.0–36.0)
MCV: 91.6 fl (ref 78.0–100.0)
Monocytes Absolute: 0.4 K/uL (ref 0.1–1.0)
Monocytes Relative: 9.1 % (ref 3.0–12.0)
Neutro Abs: 2.5 K/uL (ref 1.4–7.7)
Neutrophils Relative %: 56.2 % (ref 43.0–77.0)
Platelets: 292 K/uL (ref 150.0–400.0)
RBC: 3.87 Mil/uL (ref 3.87–5.11)
RDW: 14.9 % (ref 11.5–15.5)
WBC: 4.4 K/uL (ref 4.0–10.5)

## 2024-05-08 LAB — LIPASE: Lipase: 39 U/L (ref 11.0–59.0)

## 2024-05-08 NOTE — Telephone Encounter (Signed)
 Oh no, that medication was just a temporary medication for the urinary tract, and was not meant to be continued after finishing the original.  thanks

## 2024-05-08 NOTE — Telephone Encounter (Signed)
 Copied from CRM #[id number]. Topic: Clinical - Medication Refill >> May 08, 2024  8:55 AM Franky GRADE wrote: Medication: cephALEXin (KEFLEX) 500 MG capsule [[id number]]  Has the patient contacted their pharmacy? Yes, the pharmacy advised they were going to fax the request to the provider; however, patient has not heard back.  (Agent: If no, request that the patient contact the pharmacy for the refill. If patient does not wish to contact the pharmacy document the reason why and proceed with request.) (Agent: If yes, when and what did the pharmacy advise?)  This is the patient's preferred pharmacy:  [id number] - [place] ([place]), [place] - [address] [place] ([place])  [address] Phone: [phone] Fax: [phone]  Is this the correct pharmacy for this prescription? Yes If no, delete pharmacy and type the correct one.   Has the prescription been filled recently? Yes  Is the patient out of the medication? Yes  Has the patient been seen for an appointment in the last year OR does the patient have an upcoming appointment? Yes  Can we respond through MyChart? No, patient would prefer a call.   Agent: Please be advised that Rx refills may take up to 3 business days. We ask that you follow-up with your pharmacy.

## 2024-05-08 NOTE — Telephone Encounter (Signed)
 FYI Only or Action Required?: FYI only for provider: appointment scheduled on 05/08/2024.  Patient was last seen in primary care on 04/17/2024 by Norleen Lynwood ORN, MD.  Called Nurse Triage reporting Diarrhea.  Symptoms began several weeks ago.  Interventions attempted: OTC medications: Pepto bismol.  Symptoms are: gradually worsening.  Triage Disposition: See PCP When Office is Open (Within 3 Days)  Patient/caregiver understands and will follow disposition?: Yes        Message from Franky GRADE sent at 05/08/2024  8:54 AM EST  Reason for Triage: Patient is experiencing symptoms of diarrhea for the last 3 weeks now. With symptoms not improving.    Reason for Disposition  [1] MILD diarrhea (e.g., 1-3 or more stools than normal in past 24 hours) AND [2] present >  7 days  (Exception: Chronic diarrhea that is not worse.)  Answer Assessment - Initial Assessment Questions 1. DIARRHEA SEVERITY: How bad is the diarrhea? How many more stools have you had in the past 24 hours than normal?      3 times  2. ONSET: When did the diarrhea begin?      3 weeks  3. STOOL DESCRIPTION:  How loose or watery is the diarrhea? What is the stool color? Is there any blood or mucous in the stool?     Dark in color, loose and watery  4. VOMITING: Are you also vomiting? If Yes, ask: How many times in the past 24 hours?      0 5. ABDOMEN PAIN: Are you having any abdomen pain? If Yes, ask: What does it feel like? (e.g., crampy, dull, intermittent, constant)      Denies  6. ABDOMEN PAIN SEVERITY: If present, ask: How bad is the pain?  (e.g., Scale 1-10; mild, moderate, or severe)     None  7. ORAL INTAKE: If vomiting, Have you been able to drink liquids? How much liquids have you had in the past 24 hours?     Yes able to drink normally she states she has some mucus in her throat.  8. HYDRATION: Any signs of dehydration? (e.g., dry mouth [not just dry lips], too weak to stand,  dizziness, new weight loss) When did you last urinate?     Denies  9. ANTIBIOTIC USE: Are you taking antibiotics now or have you taken antibiotics in the past 2 months?       Yes 10. OTHER SYMPTOMS: Do you have any other symptoms? (e.g., fever, blood in stool)       Denies   Took Pepto bismal  Protocols used: [facility]

## 2024-05-08 NOTE — Telephone Encounter (Signed)
 Per chart, patient was evaluated in office today, 05/08/24.

## 2024-05-08 NOTE — Progress Notes (Signed)
 Acute Office Visit  Subjective:     Patient ID: Briana Schroeder, female    DOB: 07-Jun-1946, 78 y.o.   MRN: [id number]  Chief Complaint  Patient presents with   Diarrhea    Ongoing for 3 weeks. Has been trying pepto bismol OTC, when she does not use diarrhea comes right back. States stools are dark in color like a black/green. Denies seeing blood in stool, denies abdominal pain, N/V, and fevers.    HPI  Discussed the use of AI scribe software for clinical note transcription with the patient, who gave verbal consent to proceed.  History of Present Illness Briana Schroeder is a 78 year old female who presents with diarrhea and weight loss.  Diarrhea - Intermittent diarrhea for the past three to four weeks - Stools are dark in color, without visible blood - Diarrhea occurs regardless of food intake - Episodes frequently interrupt meals and daily activities - No relief with Pepto-Bismol or Pepcid  - No associated abdominal pain - No nausea or vomiting - Increased flatulence - Persistent mucus in the throat, especially when eating  Unintentional weight loss - Significant weight loss over the past several weeks - Weight loss is a source of concern for her health  Gastrointestinal evaluation - Stool test performed approximately one month ago for dark stools; results unknown - Referral to gastroenterology sent on October 10th, 2025, but she has not yet been evaluated  Antibiotic use - History of antibiotic prescription, taken only once - Uncertain about the intended duration of antibiotic therapy     ROS Per HPI      Objective:    BP 134/80   Pulse 62   Temp 97.7 F (36.5 C) (Temporal)   Ht 5' 1.5 (1.562 m)   Wt 111 lb 6.4 oz (50.5 kg)   SpO2 98%   BMI 20.71 kg/m    Physical Exam Vitals and nursing note reviewed.  Constitutional:      General: She is not in acute distress.    Appearance: Normal appearance. She is normal weight.  HENT:     Head:  Normocephalic and atraumatic.     Right Ear: External ear normal.     Left Ear: External ear normal.     Nose: Nose normal.     Mouth/Throat:     Mouth: Mucous membranes are moist.     Pharynx: Oropharynx is clear.  Eyes:     Extraocular Movements: Extraocular movements intact.     Pupils: Pupils are equal, round, and reactive to light.  Cardiovascular:     Rate and Rhythm: Normal rate and regular rhythm.     Pulses: Normal pulses.     Heart sounds: Normal heart sounds.  Pulmonary:     Effort: Pulmonary effort is normal. No respiratory distress.     Breath sounds: Normal breath sounds. No wheezing, rhonchi or rales.  Abdominal:     General: Abdomen is flat. Bowel sounds are normal. There is no distension.     Palpations: Abdomen is soft. There is no mass.     Tenderness: There is no abdominal tenderness. There is no right CVA tenderness, left CVA tenderness, guarding or rebound.  Musculoskeletal:        General: Normal range of motion.     Cervical back: Normal range of motion.     Right lower leg: No edema.     Left lower leg: No edema.  Lymphadenopathy:     Cervical: No cervical adenopathy.  Neurological:     General: No focal deficit present.     Mental Status: She is alert and oriented to person, place, and time.  Psychiatric:        Mood and Affect: Mood normal.        Thought Content: Thought content normal.     Results for orders placed or performed in visit on 05/08/24  CBC with Differential/Platelet  Result Value Ref Range   WBC 4.4 4.0 - 10.5 K/uL   RBC 3.87 3.87 - 5.11 Mil/uL   Hemoglobin 11.6 (L) 12.0 - 15.0 g/dL   HCT 64.4 (L) 63.9 - 53.9 %   MCV 91.6 78.0 - 100.0 fl   MCHC 32.8 30.0 - 36.0 g/dL   RDW 85.0 88.4 - 84.4 %   Platelets 292.0 150.0 - 400.0 K/uL   Neutrophils Relative % 56.2 43.0 - 77.0 %   Lymphocytes Relative 30.1 12.0 - 46.0 %   Monocytes Relative 9.1 3.0 - 12.0 %   Eosinophils Relative 3.4 0.0 - 5.0 %   Basophils Relative 1.2 0.0 - 3.0 %    Neutro Abs 2.5 1.4 - 7.7 K/uL   Lymphs Abs 1.3 0.7 - 4.0 K/uL   Monocytes Absolute 0.4 0.1 - 1.0 K/uL   Eosinophils Absolute 0.1 0.0 - 0.7 K/uL   Basophils Absolute 0.1 0.0 - 0.1 K/uL  Comprehensive metabolic panel with GFR  Result Value Ref Range   Sodium 138 135 - 145 mEq/L   Potassium 3.8 3.5 - 5.1 mEq/L   Chloride 100 96 - 112 mEq/L   CO2 28 19 - 32 mEq/L   Glucose, Bld 85 70 - 99 mg/dL   BUN 14 6 - 23 mg/dL   Creatinine, Ser 9.47 0.40 - 1.20 mg/dL   Total Bilirubin 0.5 0.2 - 1.2 mg/dL   Alkaline Phosphatase 30 (L) 39 - 117 U/L   AST 51 (H) 0 - 37 U/L   ALT 37 (H) 0 - 35 U/L   Total Protein 7.3 6.0 - 8.3 g/dL   Albumin 4.0 3.5 - 5.2 g/dL   GFR 11.74 >39.99 mL/min   Calcium  9.7 8.4 - 10.5 mg/dL  Lipase  Result Value Ref Range   Lipase 39.0 11.0 - 59.0 U/L        Assessment & Plan:   Assessment and Plan Assessment & Plan Diarrhea, Weight loss Chronic diarrhea with weight loss, dark stools possibly from Pepto-Bismol, no visible blood. Referral to GI specialist pending. Concerns about dehydration and anemia. - Ordered blood work for dehydration and anemia. - Sent stool kit for infection testing. - Provided GI specialist referral, advised scheduling. - Advised Imodium for diarrhea.  Acute Cystitis without hematuria - recently completed keflex - urine culture today     Orders Placed This Encounter  Procedures   Urine Culture    Standing Status:   Future    Number of Occurrences:   1    Expiration Date:   06/07/2024   CBC with Differential/Platelet    Release to patient:   Immediate [1]   Comprehensive metabolic panel with GFR    Release to patient:   Immediate [1]   Lipase   GI Profile, Stool, PCR     No orders of the defined types were placed in this encounter.   Return if symptoms worsen or fail to improve.  Corean LITTIE Ku, FNP

## 2024-05-08 NOTE — Patient Instructions (Addendum)
 We are checking labs today, will be in contact with any results that require further attention.  May try immodium over the counter to help with diarrhea.   Please call GI for further evaluation of your diarrhea.   We will check your urine today to make sure that the urinary tract infection has resolved to be sure that you do not need any further antibiotics.   Follow-up with me for new or worsening symptoms.

## 2024-05-08 NOTE — Telephone Encounter (Signed)
 Called and left voice mail

## 2024-05-09 LAB — URINE CULTURE: Result:: NO GROWTH

## 2024-05-10 LAB — GI PROFILE, STOOL, PCR

## 2024-05-12 ENCOUNTER — Ambulatory Visit: Payer: Self-pay | Admitting: Family Medicine

## 2024-05-12 ENCOUNTER — Other Ambulatory Visit

## 2024-05-12 ENCOUNTER — Ambulatory Visit (INDEPENDENT_AMBULATORY_CARE_PROVIDER_SITE_OTHER): Admitting: Gastroenterology

## 2024-05-12 ENCOUNTER — Encounter: Payer: Self-pay | Admitting: Gastroenterology

## 2024-05-12 VITALS — BP 136/80 | HR 84 | Ht 61.5 in | Wt 114.4 lb

## 2024-05-12 DIAGNOSIS — R194 Change in bowel habit: Secondary | ICD-10-CM

## 2024-05-12 DIAGNOSIS — R748 Abnormal levels of other serum enzymes: Secondary | ICD-10-CM

## 2024-05-12 DIAGNOSIS — E538 Deficiency of other specified B group vitamins: Secondary | ICD-10-CM

## 2024-05-12 DIAGNOSIS — D649 Anemia, unspecified: Secondary | ICD-10-CM

## 2024-05-12 DIAGNOSIS — Z860100 Personal history of colon polyps, unspecified: Secondary | ICD-10-CM

## 2024-05-12 DIAGNOSIS — K760 Fatty (change of) liver, not elsewhere classified: Secondary | ICD-10-CM

## 2024-05-12 DIAGNOSIS — R634 Abnormal weight loss: Secondary | ICD-10-CM | POA: Diagnosis not present

## 2024-05-12 DIAGNOSIS — R1310 Dysphagia, unspecified: Secondary | ICD-10-CM

## 2024-05-12 DIAGNOSIS — R197 Diarrhea, unspecified: Secondary | ICD-10-CM

## 2024-05-12 DIAGNOSIS — K219 Gastro-esophageal reflux disease without esophagitis: Secondary | ICD-10-CM | POA: Diagnosis not present

## 2024-05-12 LAB — PROTIME-INR
INR: 1.1 ratio — ABNORMAL HIGH (ref 0.8–1.0)
Prothrombin Time: 11.4 s (ref 9.6–13.1)

## 2024-05-12 NOTE — Patient Instructions (Addendum)
 _______________________________________________________  If your blood pressure at your visit was 140/90 or greater, please contact your primary care physician to follow up on this.  _______________________________________________________  If you are age 79 or older, your body mass index should be between 23-30. Your Body mass index is 21.26 kg/m. If this is out of the aforementioned range listed, please consider follow up with your Primary Care Provider.  If you are age 10 or younger, your body mass index should be between 19-25. Your Body mass index is 21.26 kg/m. If this is out of the aformentioned range listed, please consider follow up with your Primary Care Provider.   ________________________________________________________  The [facility] providers would like to encourage you to use MYCHART to communicate with providers for non-urgent requests or questions.  Due to long hold times on the telephone, sending your provider a message by [facility] may be a faster and more efficient way to get a response.  Please allow 48 business hours for a response.  Please remember that this is for non-urgent requests.  _______________________________________________________  [facility] is using a team-based approach to care.  Your team is made up of your doctor and two to three APPS. Our APPS (Nurse Practitioners and Physician Assistants) work with your physician to ensure care continuity for you. They are fully qualified to address your health concerns and develop a treatment plan. They communicate directly with your gastroenterologist to care for you. Seeing the Advanced Practice Practitioners on your physician's team can help you by facilitating care more promptly, often allowing for earlier appointments, access to diagnostic testing, procedures, and other specialty referrals.   Your provider has requested that you go to the basement level for lab work before leaving today. Press B on the  elevator. The lab is located at the first door on the left as you exit the elevator.  You have been scheduled for an appointment with Camie Furbish PA-C on 06-15-24 at 10am . Please arrive 10 minutes early for your appointment.  You have been scheduled for an abdominal ultrasound at [facility] (1st floor of hospital) on 06-01-24 at 9am. Please arrive 15 minutes prior to your appointment for registration. Make certain not to have anything to eat or drink midnight prior to your appointment. Should you need to reschedule your appointment, please contact radiology at [phone]. This test typically takes about 30 minutes to perform.  You have been scheduled for a Barium Esophogram at [facility] (1st floor of the hospital) on 06-01-24 at 10am. Please arrive 30 minutes prior to your appointment for registration. Make certain not to have anything to eat or drink 3 hours prior to your test. If you need to reschedule for any reason, please contact radiology at [phone] to do so. __________________________________________________________________ A barium swallow is an examination that concentrates on views of the esophagus. This tends to be a double contrast exam (barium and two liquids which, when combined, create a gas to distend the wall of the oesophagus) or single contrast (non-ionic iodine based). The study is usually tailored to your symptoms so a good history is essential. Attention is paid during the study to the form, structure and configuration of the esophagus, looking for functional disorders (such as aspiration, dysphagia, achalasia, motility and reflux) EXAMINATION You may be asked to change into a gown, depending on the type of swallow being performed. A radiologist and radiographer will perform the procedure. The radiologist will advise you of the type of contrast selected for your procedure  and direct you during the exam. You will be asked to stand, sit or lie in several  different positions and to hold a small amount of fluid in your mouth before being asked to swallow while the imaging is performed .In some instances you may be asked to swallow barium coated marshmallows to assess the motility of a solid food bolus. The exam can be recorded as a digital or video fluoroscopy procedure. POST PROCEDURE It will take 1-2 days for the barium to pass through your system. To facilitate this, it is important, unless otherwise directed, to increase your fluids for the next 24-48hrs and to resume your normal diet.  This test typically takes about 30 minutes to perform. __________________________________________________________________________________  It was a pleasure to see you today!  Thank you for trusting me with your gastrointestinal care!

## 2024-05-12 NOTE — Progress Notes (Unsigned)
 Briana Schroeder [id number] [birth year]   Chief Complaint: Diarrhea  Referring Provider: Norleen Lynwood ORN, MD Primary GI MD: Sampson (previous Dr. Aneita)  HPI: Briana Schroeder is a 78 y.o. female with past medical history of colon polyps, GERD, HLD, HTN, IBS, osteopenia, polymyalgia rheumatica, hysterectomy who presents today for a complaint of diarrhea and dysphagia.    Seen by PCP 04/08/2024 at which time endorsed dark green color diarrhea for about 2 to 3 weeks.  Labs and stool studies ordered at that time.  Referred to GI for further evaluation and possible colonoscopy.  Labs 04/08/2024: CBC consistent with chronic anemia, hemoglobin 10.8, mild elevation in liver enzymes with AST 49, ALT 39, TSH normal  GI pathogen panel was normal.  Labs 04/17/2024: Normal ferritin, low iron saturation ratio, low transferrin  Seen 05/08/2024 at family medicine for chronic diarrhea with weight loss, dark stools possibly from Pepto-Bismol, no visible blood.  Advised to use Imodium, keep follow-up with GI.  Labs 05/08/2024 showed stable anemia, persistent elevation in liver enzymes, normal lipase, negative urine culture, negative GI pathogen panel.  Started Crestor  in April, had already had mild elevation in liver enzymes prior to that in January.   Discussed the use of AI scribe software for clinical note transcription with the patient, who gave verbal consent to proceed.  History of Present Illness Briana Schroeder is a 78 year old female who presents with mucus in her throat and swallowing difficulties. She was referred by her primary care physician for evaluation of chronic diarrhea.  Chronic diarrhea - Chronic diarrhea resolved over the past three weeks - Previously experienced multiple watery bowel movements daily, a significant change from baseline of once daily or every other day - Currently no diarrhea for the past three weeks - Normal bowel movement today - No blood in stool -  Stools no longer dark since discontinuing Pepto Bismol - Managed diarrhea with as-needed Imodium (effective) and Pepto Bismol (ineffective)  Oropharyngeal dysphagia and throat mucus - Persistent sensation of mucus in the throat, especially when eating - Requires thorough chewing before swallowing; insufficient chewing leads to difficulty swallowing and need to spit out food due to excessive mucus - No odynophagia - No acid reflux, heartburn, nausea, vomiting, or abdominal pain - Expectorates some mucus, which is not discolored - No dyspnea or cough  Unintentional weight loss - Significant weight loss from 140 lbs to 114 lbs reported (Per chart review, in April weighed 120 lb, and one year ago was 123 lb). - Maintains good appetite, eating breakfast, lunch, and dinner (occasionally skips lunch) - Does not consume large quantities of food at once, consistent with usual eating habits  Iron deficiency anemia - History of iron deficiency anemia - Not currently taking iron supplements - Iron levels have been low for several years - Ferritin levels are normal - Takes B12 supplements - No referral to hematology  Hepatic steatosis and elevated liver enzymes - History of elevated liver enzymes - Diagnosed with hepatic steatosis by ultrasound last year - Not overweight - History of hyperlipidemia and hypertension, both managed with medication - Denies alcohol use - Takes Tylenol  occasionally, no more than twice a week  Gastroesophageal reflux disease - History of gastroesophageal reflux disease - Currently taking pantoprazole  daily - Continues to experience mucus in the throat despite therapy   Previous GI Procedures/Imaging   Abdominal ultrasound 01/29/2023 (for elevated LFTs) 1. No acute abnormality identified. 2. Fatty infiltration of the liver.  Colonoscopy 01/12/2021 -  Two 5 to 6 mm polyps in the transverse colon and in the ascending colon, removed with a cold snare. Resected and  retrieved.  - Mild diverticulosis in the sigmoid colon.  - Internal hemorrhoids.  - The examination was otherwise normal on direct and retroflexion views. - No recall due to age Path: Surgical [P], colon, ascending polyp x1, transverse polyp x1, polyp (2) - TUBULAR ADENOMA(S). - NO HIGH GRADE DYSPLASIA OR CARCINOMA.  EGD 12/2009 Normal EGD  Past Medical History:  Diagnosis Date   ALLERGIC RHINITIS 08/02/2009   Allergy    ANXIETY 10/18/2008   ARM PAIN, RIGHT 04/04/2009   Arthritis    BACK PAIN 08/02/2009   CHEST PAIN 12/05/2009   COLONIC POLYPS, HX OF 10/18/2008   DIZZINESS 12/05/2009   EPIGASTRIC PAIN 01/09/2010   FATIGUE 09/06/2009   GERD 12/06/2009   HOARSENESS 09/06/2009   HYPERLIPIDEMIA 10/18/2008   HYPERTENSION 10/18/2008   IBS 10/18/2008   MYALGIA 03/21/2009   Osteopenia 12/2018   T score -1.7 FRAX 12% / 2.5% table from prior DEXA 2018   PULMONARY SARCOIDOSIS 10/18/2008   SINUSITIS- ACUTE-NOS 03/21/2009   VOCAL CORD NODULE 09/06/2009    Past Surgical History:  Procedure Laterality Date   ABDOMINAL HYSTERECTOMY  08/1992   TAH-DR G FOR FIBROIDS AND MENORRHAGIA   COLONOSCOPY  11-24-2009   3 polyps, +TA and HPP   hx of vocal cord nodules     MUSCLE BIOPSY Right 08/23/2022   Procedure: RIGHT DELTOID MUSCLE BIOPSY;  Surgeon: Aron Shoulders, MD;  Location: [facility];  Service: General;  Laterality: Right;   s/p right finger tendon surgury     UPPER GASTROINTESTINAL ENDOSCOPY  01-08-2010   normal    Current Outpatient Medications  Medication Sig Dispense Refill   amLODipine  (NORVASC ) 10 MG tablet Take 1 tablet by mouth once daily 90 tablet 0   ascorbic acid (VITAMIN C) 500 MG tablet Take 500 mg by mouth daily.     cetirizine  (ZYRTEC ) 10 MG tablet Take 1 tablet by mouth once daily 90 tablet 0   cyclobenzaprine  (FLEXERIL ) 5 MG tablet Take 1 tablet (5 mg total) by mouth at bedtime as needed for muscle spasms. 90 tablet 3   DULoxetine  (CYMBALTA ) 30 MG capsule Take 1  capsule (30 mg total) by mouth 2 (two) times daily. 180 capsule 3   gatifloxacin (ZYMAXID) 0.5 % SOLN Place 1 drop into the right eye 4 (four) times daily.     ketorolac (ACULAR) 0.5 % ophthalmic solution Place 1 drop into the right eye 2 (two) times daily.     losartan  (COZAAR ) 50 MG tablet Take 1 tablet by mouth once daily 90 tablet 2   meloxicam  (MOBIC ) 15 MG tablet TAKE 1 TABLET BY MOUTH ONCE DAILY AS NEEDED 90 tablet 3   Methylcobalamin (B-12) 5000 MCG TBDP Take 5,000 mcg by mouth daily.     pantoprazole  (PROTONIX ) 40 MG tablet Take 1 tablet by mouth once daily 90 tablet 0   prednisoLONE acetate (PRED FORTE) 1 % ophthalmic suspension Place 1 drop into the right eye 4 (four) times daily.     RESTASIS 0.05 % ophthalmic emulsion SMARTSIG:1 In Eye(s) Twice Daily     rosuvastatin  (CRESTOR ) 10 MG tablet Take 1 tablet (10 mg total) by mouth daily. 90 tablet 3   traMADol  (ULTRAM ) 50 MG tablet TAKE 1 TABLET BY MOUTH EVERY 6 HOURS AS NEEDED 60 tablet 2   VITAMIN D , CHOLECALCIFEROL, PO Take 1 tablet by mouth daily.  No current facility-administered medications for this visit.    Allergies as of 05/12/2024 - Review Complete 05/12/2024  Allergen Reaction Noted   Grass pollen([id number] vern)  05/12/2024    Family History  Problem Relation Age of Onset   Diabetes Mother    Hypertension Mother    Breast cancer Mother 29   Hypertension Sister    Mental illness Daughter    Colon cancer Neg Hx    Colon polyps Neg Hx    Rectal cancer Neg Hx    Stomach cancer Neg Hx    Esophageal cancer Neg Hx     Social History   Tobacco Use   Smoking status: Never   Smokeless tobacco: Never  Vaping Use   Vaping status: Never Used  Substance Use Topics   Alcohol use: No    Alcohol/week: 0.0 standard drinks of alcohol   Drug use: No     Review of Systems:    Constitutional: Unintentional weight loss.  No fever or chills Cardiovascular: No chest pain Respiratory: No SOB  Gastrointestinal: See  HPI and otherwise negative   Physical Exam:  Vital signs: BP 136/80 (BP Location: Left Arm, Patient Position: Sitting, Cuff Size: Normal)   Pulse 84 Comment: irregular  Ht 5' 1.5 (1.562 m) Comment: height measured without shoes  Wt 114 lb 6 oz (51.9 kg)   BMI 21.26 kg/m   Wt Readings from Last 3 Encounters:  05/12/24 114 lb 6 oz (51.9 kg)  05/08/24 111 lb 6.4 oz (50.5 kg)  04/08/24 115 lb (52.2 kg)    Was previously 140 lb per patient. Per chart review, in April weighed 120 lb, and one year ago was 123 lb.  Constitutional: Pleasant, well-appearing female in NAD, alert and cooperative Head:  Normocephalic and atraumatic.  Eyes: No scleral icterus.  Respiratory: Respirations even and unlabored. Lungs clear to auscultation bilaterally.  No wheezes, crackles, or rhonchi.  Cardiovascular:  Regular rate and rhythm. No murmurs. No peripheral edema. Gastrointestinal:  Soft, nondistended, nontender. No rebound or guarding. Normal bowel sounds. No appreciable masses or hepatomegaly. Rectal:  Not performed.  Neurologic:  Alert and oriented x4;  grossly normal neurologically.  Skin:   Dry and intact without significant lesions or rashes. Psychiatric: Oriented to person, place and time. Demonstrates good judgement and reason without abnormal affect or behaviors.   RELEVANT LABS AND IMAGING: CBC    Component Value Date/Time   WBC 4.4 05/08/2024 1146   RBC 3.87 05/08/2024 1146   HGB 11.6 (L) 05/08/2024 1146   HCT 35.5 (L) 05/08/2024 1146   PLT 292.0 05/08/2024 1146   MCV 91.6 05/08/2024 1146   MCH 29.0 08/10/2022 0911   MCHC 32.8 05/08/2024 1146   RDW 14.9 05/08/2024 1146   LYMPHSABS 1.3 05/08/2024 1146   MONOABS 0.4 05/08/2024 1146   EOSABS 0.1 05/08/2024 1146   BASOSABS 0.1 05/08/2024 1146    CMP     Component Value Date/Time   NA 138 05/08/2024 1146   K 3.8 05/08/2024 1146   CL 100 05/08/2024 1146   CO2 28 05/08/2024 1146   GLUCOSE 85 05/08/2024 1146   BUN 14 05/08/2024  1146   CREATININE 0.52 05/08/2024 1146   CALCIUM  9.7 05/08/2024 1146   PROT 7.3 05/08/2024 1146   ALBUMIN 4.0 05/08/2024 1146   AST 51 (H) 05/08/2024 1146   ALT 37 (H) 05/08/2024 1146   ALKPHOS 30 (L) 05/08/2024 1146   BILITOT 0.5 05/08/2024 1146   GFRNONAA >60 08/10/2022 0911  GFRAA >60 10/13/2016 0931   Echocardiogram 08/15/2023 1. Left ventricular ejection fraction, by estimation, is 55 to 60% . Left ventricular ejection fraction by 3D volume is 58 % . The left ventricle has normal function. The left ventricle has no regional wall motion abnormalities. There is mild left ventricular hypertrophy of the basal- septal segment. Left ventricular diastolic parameters are indeterminate. The average left ventricular global longitudinal strain is 18. 0 % . The global longitudinal strain is normal.  2. Right ventricular systolic function is normal. The right ventricular size is normal. There is normal pulmonary artery systolic pressure. The estimated right ventricular systolic pressure is 35. 3 mmHg.  3. The mitral valve is normal in structure. Mild mitral valve regurgitation. No evidence of mitral stenosis.  4. The aortic valve is tricuspid. Aortic valve regurgitation is trivial. Aortic valve sclerosis is present, with no evidence of aortic valve stenosis. Aortic regurgitation PHT measures 499 msec.  5. Pulmonic valve regurgitation is moderate.  6. The inferior vena cava is normal in size with greater than 50% respiratory variability, suggesting right atrial pressure of 3 mmHg.  Assessment/Plan:   Assessment & Plan Chronic throat mucus and dysphagia Chronic throat mucus with dysphagia, differential includes esophageal stricture or mass. Previous normal endoscopy in 2011. Barium swallow considered due to upcoming knee surgery.  - Ordered barium swallow to evaluate for esophageal abnormalities. - Will consider upper endoscopy post-surgery if symptoms persist. - Follow up in 4 weeks at which time  can discuss role of endoscopic evaluation if warranted (does have evidence of iron deficiency anemia which has not previously been evaluated with endoscopy)  Unintentional weight loss Patient reports significant weight loss from 140 lbs to 114 lbs with good appetite but reduced intake.  Per chart review, in April weighed 120 lb, and one year ago was 123 lb. Last colonoscopy in 2022 with 2 polyps found and no recall due to age.  - Monitor weight closely. - Will consider CT scan if continued weight loss.  Iron deficiency anemia Chronic iron deficiency anemia with low iron levels, normal ferritin. B12 supplementation ongoing. Anemia stable.  Elevated liver enzymes with hepatic steatosis Persistent mildly elevated liver enzymes with hepatic steatosis on imaging. On Crestor . Further workup needed.  Fibrosis 4 Score = 2.27 Fib-4 interpretation is not validated for people under 35 or over 33 years of age. However, scores under 2.0 are generally considered low risk.  - Labs today: Hepatitis A antibody total, hepatitis B surface antibody, hepatitis B surface antigen, hepatitis C antibody, PT/INR, TTG, IgA, mitochondrial antibodies, IgG, ANA, ASMA, alpha-1 antitrypsin - Ordered updated liver ultrasound for hepatic steatosis, elevated LFTs, and weight loss, with consideration for CT if continued weight loss and no explanation for this on ultrasound. - Consider FibroScan - Abstain from all alcohol including beer, wine, liquor, and non-alcoholic beer.  - Work to maintain a healthy weight through portion control and exercise  - Maximize control of any hyperglycemia and hyperlipidemia   History of chronic diarrhea, now resolved Chronic diarrhea resolved with Imodium, no recurrence for three weeks.  No longer needing Imodium.  Previous stool tests normal. Monitoring for recurrence.  - Continue to monitor for recurrence of diarrhea. - Will consider colonoscopy if diarrhea recurs.  History of  gastroesophageal reflux disease (GERD) GERD managed with pantoprazole , no current symptoms. Persistent throat mucus despite medication.  - Continue pantoprazole . - Will consider further evaluation if symptoms persist.   Camie Furbish, PA-C [facility] 05/14/2024, 4:14 PM  Patient Care  Team: Norleen Lynwood ORN, MD as PCP - General Fontaine, Evalene SQUIBB, MD (Inactive) as Consulting Physician (Gynecology) Aneita Gwendlyn DASEN, MD (Inactive) as Consulting Physician (Gastroenterology) Regal, [place] RAMAN, DPM as Consulting Physician (Podiatry) Patel, Donika K, DO as Consulting Physician (Neurology)

## 2024-05-13 ENCOUNTER — Other Ambulatory Visit: Payer: Self-pay

## 2024-05-14 ENCOUNTER — Encounter: Payer: Self-pay | Admitting: Gastroenterology

## 2024-05-14 NOTE — Progress Notes (Signed)
 Attending Physician's Attestation   I have reviewed the chart.   I agree with the Advanced Practitioner's note, impression, and recommendations with any updates as below. Glad to hear that symptomatology is improved in regards to her chronic diarrhea.  Agree with good follow-up of her true weights but if continued weight loss is occurring cross-sectional imaging makes sense.  If appetite remains an issue or anorexia is developing, at least an upper endoscopy should be considered.  Will see what the updated ultrasound imaging shows.  It may be reasonable, if we can get approval for ultrasound elastography, since we do not have FibroScan available as of yet and her fib 4 score (although not validated for her age) is indeterminant.  If we can change her ultrasound to ultrasound elastography, that may make the most sense in the interim.  We can consider FibroScan in future.   Aloha Finner, MD [facility] Advanced Endoscopy Office # [phone]

## 2024-05-15 ENCOUNTER — Telehealth: Payer: Self-pay | Admitting: Gastroenterology

## 2024-05-15 LAB — HEPATITIS B SURFACE ANTIGEN: Hepatitis B Surface Ag: NONREACTIVE

## 2024-05-15 LAB — MITOCHONDRIAL ANTIBODIES: Mitochondrial M2 Ab, IgG: <=20 U (ref <=20.0)

## 2024-05-15 LAB — ANA: Anti Nuclear Antibody (ANA): POSITIVE — AB

## 2024-05-15 LAB — IGA: Immunoglobulin A: 157 mg/dL (ref 70–320)

## 2024-05-15 LAB — ANTI-SMOOTH MUSCLE ANTIBODY, IGG: Actin (Smooth Muscle) Antibody (IGG): <20 U (ref <20)

## 2024-05-15 LAB — ANTI-NUCLEAR AB-TITER (ANA TITER): ANA Titer 1: 1:80 {titer} — ABNORMAL HIGH

## 2024-05-15 LAB — IGG: IgG (Immunoglobin G), Serum: 1575 mg/dL — ABNORMAL HIGH (ref 600–1540)

## 2024-05-15 LAB — ALPHA-1-ANTITRYPSIN: A-1 Antitrypsin, Ser: 168 mg/dL (ref 83–199)

## 2024-05-15 LAB — HEPATITIS A ANTIBODY, TOTAL: Hepatitis A AB,Total: REACTIVE — AB

## 2024-05-15 LAB — HEPATITIS C ANTIBODY: Hepatitis C Ab: NONREACTIVE

## 2024-05-15 LAB — HEPATITIS B SURFACE ANTIBODY,QUALITATIVE: Hep B S Ab: NONREACTIVE

## 2024-05-15 LAB — TISSUE TRANSGLUTAMINASE, IGA: (tTG) Ab, IgA: <1 U/mL

## 2024-05-15 NOTE — Telephone Encounter (Signed)
 Brooke,  Please see if we can change order for US  to include liver elastography, thank you.

## 2024-05-15 NOTE — Addendum Note (Signed)
 Addended by: KATHIE BOTTCHER E on: 05/15/2024 09:33 AM   Modules accepted: Orders

## 2024-05-15 NOTE — Telephone Encounter (Signed)
 Its done. Thank you.

## 2024-05-18 DIAGNOSIS — G8918 Other acute postprocedural pain: Secondary | ICD-10-CM | POA: Diagnosis not present

## 2024-05-18 DIAGNOSIS — M2419 Other articular cartilage disorders, other specified site: Secondary | ICD-10-CM | POA: Diagnosis not present

## 2024-05-18 DIAGNOSIS — M1711 Unilateral primary osteoarthritis, right knee: Secondary | ICD-10-CM | POA: Diagnosis not present

## 2024-05-18 DIAGNOSIS — M94261 Chondromalacia, right knee: Secondary | ICD-10-CM | POA: Diagnosis not present

## 2024-05-20 DIAGNOSIS — Z4789 Encounter for other orthopedic aftercare: Secondary | ICD-10-CM | POA: Diagnosis not present

## 2024-05-21 ENCOUNTER — Ambulatory Visit

## 2024-05-21 DIAGNOSIS — M25561 Pain in right knee: Secondary | ICD-10-CM | POA: Diagnosis not present

## 2024-05-21 DIAGNOSIS — R269 Unspecified abnormalities of gait and mobility: Secondary | ICD-10-CM | POA: Diagnosis not present

## 2024-05-21 DIAGNOSIS — M25661 Stiffness of right knee, not elsewhere classified: Secondary | ICD-10-CM | POA: Diagnosis not present

## 2024-05-21 DIAGNOSIS — R29898 Other symptoms and signs involving the musculoskeletal system: Secondary | ICD-10-CM | POA: Diagnosis not present

## 2024-05-22 ENCOUNTER — Ambulatory Visit: Payer: Self-pay | Admitting: Gastroenterology

## 2024-05-25 DIAGNOSIS — M25661 Stiffness of right knee, not elsewhere classified: Secondary | ICD-10-CM | POA: Diagnosis not present

## 2024-05-25 DIAGNOSIS — R269 Unspecified abnormalities of gait and mobility: Secondary | ICD-10-CM | POA: Diagnosis not present

## 2024-05-25 DIAGNOSIS — R29898 Other symptoms and signs involving the musculoskeletal system: Secondary | ICD-10-CM | POA: Diagnosis not present

## 2024-05-25 DIAGNOSIS — M25561 Pain in right knee: Secondary | ICD-10-CM | POA: Diagnosis not present

## 2024-05-26 DIAGNOSIS — M1990 Unspecified osteoarthritis, unspecified site: Secondary | ICD-10-CM | POA: Diagnosis not present

## 2024-05-26 DIAGNOSIS — M353 Polymyalgia rheumatica: Secondary | ICD-10-CM | POA: Diagnosis not present

## 2024-05-26 DIAGNOSIS — G5731 Lesion of lateral popliteal nerve, right lower limb: Secondary | ICD-10-CM | POA: Diagnosis not present

## 2024-05-26 DIAGNOSIS — Z79899 Other long term (current) drug therapy: Secondary | ICD-10-CM | POA: Diagnosis not present

## 2024-05-26 DIAGNOSIS — R202 Paresthesia of skin: Secondary | ICD-10-CM | POA: Diagnosis not present

## 2024-05-26 DIAGNOSIS — R760 Raised antibody titer: Secondary | ICD-10-CM | POA: Diagnosis not present

## 2024-05-26 DIAGNOSIS — R748 Abnormal levels of other serum enzymes: Secondary | ICD-10-CM | POA: Diagnosis not present

## 2024-06-01 ENCOUNTER — Ambulatory Visit: Payer: Self-pay | Admitting: Gastroenterology

## 2024-06-01 ENCOUNTER — Ambulatory Visit (HOSPITAL_COMMUNITY)

## 2024-06-01 ENCOUNTER — Other Ambulatory Visit: Payer: Self-pay | Admitting: Gastroenterology

## 2024-06-01 ENCOUNTER — Ambulatory Visit (HOSPITAL_COMMUNITY)
Admission: RE | Admit: 2024-06-01 | Discharge: 2024-06-01 | Disposition: A | Source: Ambulatory Visit | Attending: Gastroenterology

## 2024-06-01 DIAGNOSIS — R748 Abnormal levels of other serum enzymes: Secondary | ICD-10-CM | POA: Insufficient documentation

## 2024-06-01 DIAGNOSIS — R634 Abnormal weight loss: Secondary | ICD-10-CM

## 2024-06-01 DIAGNOSIS — R1310 Dysphagia, unspecified: Secondary | ICD-10-CM

## 2024-06-01 DIAGNOSIS — K760 Fatty (change of) liver, not elsewhere classified: Secondary | ICD-10-CM

## 2024-06-01 DIAGNOSIS — K219 Gastro-esophageal reflux disease without esophagitis: Secondary | ICD-10-CM

## 2024-06-01 DIAGNOSIS — E538 Deficiency of other specified B group vitamins: Secondary | ICD-10-CM

## 2024-06-01 DIAGNOSIS — D649 Anemia, unspecified: Secondary | ICD-10-CM

## 2024-06-01 DIAGNOSIS — Z860100 Personal history of colon polyps, unspecified: Secondary | ICD-10-CM

## 2024-06-01 DIAGNOSIS — Z944 Liver transplant status: Secondary | ICD-10-CM | POA: Diagnosis not present

## 2024-06-01 DIAGNOSIS — R197 Diarrhea, unspecified: Secondary | ICD-10-CM

## 2024-06-01 DIAGNOSIS — K449 Diaphragmatic hernia without obstruction or gangrene: Secondary | ICD-10-CM | POA: Diagnosis not present

## 2024-06-01 DIAGNOSIS — R194 Change in bowel habit: Secondary | ICD-10-CM

## 2024-06-02 DIAGNOSIS — R269 Unspecified abnormalities of gait and mobility: Secondary | ICD-10-CM | POA: Diagnosis not present

## 2024-06-02 DIAGNOSIS — Z471 Aftercare following joint replacement surgery: Secondary | ICD-10-CM | POA: Diagnosis not present

## 2024-06-02 DIAGNOSIS — Z96651 Presence of right artificial knee joint: Secondary | ICD-10-CM | POA: Diagnosis not present

## 2024-06-02 DIAGNOSIS — M25561 Pain in right knee: Secondary | ICD-10-CM | POA: Diagnosis not present

## 2024-06-02 DIAGNOSIS — M25661 Stiffness of right knee, not elsewhere classified: Secondary | ICD-10-CM | POA: Diagnosis not present

## 2024-06-02 DIAGNOSIS — R29898 Other symptoms and signs involving the musculoskeletal system: Secondary | ICD-10-CM | POA: Diagnosis not present

## 2024-06-02 NOTE — Progress Notes (Signed)
 Left message on machine to call back

## 2024-06-03 ENCOUNTER — Other Ambulatory Visit: Payer: Self-pay

## 2024-06-03 DIAGNOSIS — N281 Cyst of kidney, acquired: Secondary | ICD-10-CM

## 2024-06-03 NOTE — Progress Notes (Signed)
 Left message on machine to call back

## 2024-06-04 DIAGNOSIS — R29898 Other symptoms and signs involving the musculoskeletal system: Secondary | ICD-10-CM | POA: Diagnosis not present

## 2024-06-04 DIAGNOSIS — R269 Unspecified abnormalities of gait and mobility: Secondary | ICD-10-CM | POA: Diagnosis not present

## 2024-06-04 DIAGNOSIS — M25661 Stiffness of right knee, not elsewhere classified: Secondary | ICD-10-CM | POA: Diagnosis not present

## 2024-06-04 DIAGNOSIS — M25561 Pain in right knee: Secondary | ICD-10-CM | POA: Diagnosis not present

## 2024-06-05 DIAGNOSIS — H2511 Age-related nuclear cataract, right eye: Secondary | ICD-10-CM | POA: Diagnosis not present

## 2024-06-05 DIAGNOSIS — H25041 Posterior subcapsular polar age-related cataract, right eye: Secondary | ICD-10-CM | POA: Diagnosis not present

## 2024-06-05 DIAGNOSIS — H5371 Glare sensitivity: Secondary | ICD-10-CM | POA: Diagnosis not present

## 2024-06-05 DIAGNOSIS — H25011 Cortical age-related cataract, right eye: Secondary | ICD-10-CM | POA: Diagnosis not present

## 2024-06-09 ENCOUNTER — Other Ambulatory Visit: Payer: Self-pay

## 2024-06-09 ENCOUNTER — Other Ambulatory Visit: Payer: Self-pay | Admitting: Internal Medicine

## 2024-06-10 DIAGNOSIS — R269 Unspecified abnormalities of gait and mobility: Secondary | ICD-10-CM | POA: Diagnosis not present

## 2024-06-10 DIAGNOSIS — R29898 Other symptoms and signs involving the musculoskeletal system: Secondary | ICD-10-CM | POA: Diagnosis not present

## 2024-06-10 DIAGNOSIS — M25661 Stiffness of right knee, not elsewhere classified: Secondary | ICD-10-CM | POA: Diagnosis not present

## 2024-06-10 DIAGNOSIS — M25561 Pain in right knee: Secondary | ICD-10-CM | POA: Diagnosis not present

## 2024-06-12 ENCOUNTER — Ambulatory Visit (HOSPITAL_COMMUNITY)

## 2024-06-12 DIAGNOSIS — R29898 Other symptoms and signs involving the musculoskeletal system: Secondary | ICD-10-CM | POA: Diagnosis not present

## 2024-06-12 DIAGNOSIS — M25561 Pain in right knee: Secondary | ICD-10-CM | POA: Diagnosis not present

## 2024-06-12 DIAGNOSIS — M25661 Stiffness of right knee, not elsewhere classified: Secondary | ICD-10-CM | POA: Diagnosis not present

## 2024-06-12 DIAGNOSIS — R269 Unspecified abnormalities of gait and mobility: Secondary | ICD-10-CM | POA: Diagnosis not present

## 2024-06-15 ENCOUNTER — Encounter: Payer: Self-pay | Admitting: Gastroenterology

## 2024-06-15 ENCOUNTER — Other Ambulatory Visit

## 2024-06-15 ENCOUNTER — Ambulatory Visit: Admitting: Gastroenterology

## 2024-06-15 VITALS — BP 118/78 | HR 86 | Ht 63.0 in | Wt 107.1 lb

## 2024-06-15 DIAGNOSIS — R748 Abnormal levels of other serum enzymes: Secondary | ICD-10-CM | POA: Diagnosis not present

## 2024-06-15 DIAGNOSIS — N281 Cyst of kidney, acquired: Secondary | ICD-10-CM | POA: Diagnosis not present

## 2024-06-15 DIAGNOSIS — R634 Abnormal weight loss: Secondary | ICD-10-CM | POA: Diagnosis not present

## 2024-06-15 DIAGNOSIS — K219 Gastro-esophageal reflux disease without esophagitis: Secondary | ICD-10-CM | POA: Diagnosis not present

## 2024-06-15 DIAGNOSIS — R1310 Dysphagia, unspecified: Secondary | ICD-10-CM

## 2024-06-15 NOTE — Progress Notes (Signed)
 BETHZY HAUCK [id number] 1945-11-22   Chief Complaint: Weight loss  Referring Provider: Norleen Lynwood ORN, MD Primary GI MD: Dr. Wilhelmenia  HPI: Briana Schroeder is a 78 y.o. female with past medical history of colon polyps, GERD, HLD, HTN, IBS, osteopenia, polymyalgia rheumatica, hysterectomy who presents today for a complaint of dysphagia and weight loss.    Seen by PCP 04/08/2024 at which time endorsed dark green color diarrhea for about 2 to 3 weeks.  Labs and stool studies ordered at that time.  Referred to GI for further evaluation and possible colonoscopy.   Labs 04/08/2024: CBC consistent with chronic anemia, hemoglobin 10.8, mild elevation in liver enzymes with AST 49, ALT 39, TSH normal   GI pathogen panel was normal.   Labs 04/17/2024: Normal ferritin, low iron saturation ratio, low transferrin   Seen 05/08/2024 at family medicine for chronic diarrhea with weight loss, dark stools possibly from Pepto-Bismol, no visible blood.  Advised to use Imodium, keep follow-up with GI.   Labs 05/08/2024 showed stable anemia, persistent elevation in liver enzymes, normal lipase, negative urine culture, negative GI pathogen panel.   Started Crestor  in April, had already had mild elevation in liver enzymes prior to that in January.   Patient seen for initial consult 05/12/2024 for multiple GI complaints. Endorsed chronic throat mucus and dysphagia, previous normal endoscopy in 2011.  Was having upcoming knee surgery and did not want to schedule endoscopic evaluation at that time so barium swallow was ordered, with consideration for EGD postsurgery if symptoms persist or concerning findings on swallowing study. Patient also noted to have evidence of iron deficiency anemia which was stable at that time. Endorsed unintentional weight loss, with good appetite but reduced oral intake.   Last colonoscopy 2022 with 2 polyps found and no recall recommended due to age. She has persistent mildly  elevated liver enzymes with hepatic steatosis on prior imaging, also on Crestor .  Fib 4 score was calculated to be 2.27.  Full serologic workup was ordered.  Liver ultrasound with elastography ordered as well. Chronic diarrhea at the time of visit had resolved with Imodium with no recurrence for 3 weeks, no longer requiring Imodium and previous stool test were normal.  Advised to monitor for recurrence.  Labs showed positive ANA with a low titer, negative for hepatitis though nonimmune to hepatitis B, negative autoimmune disease of the liver though did have a mildly elevated IgG which 1 year ago was normal.  Abdominal ultrasound showed increased size of a left renal cyst versus cluster of cysts and renal protocol MRI abdomen/CT abdomen was recommended for further evaluation. The liver appeared normal, elastography portion of the exam showed a median kPa of 2.8, high probability of being normal.  No concerning findings on barium swallow, noted to have a small hiatal hernia and mild reflux.  CT renal abdomen with and without contrast is scheduled for 06/17/2024.   Discussed the use of AI scribe software for clinical note transcription with the patient, who gave verbal consent to proceed.  History of Present Illness Briana Schroeder is a 78 year old female who presents with weight loss and swallowing issues. She is accompanied by her husband.  Dysphagia and oropharyngeal symptoms - Persistent mucus in the throat since last visit - Difficulty swallowing ongoing since last visit - No chest pain - No dyspnea  Unintentional weight loss - Weight loss present, unclear amount or duration  Gastroesophageal reflux and hiatal hernia - Previous barium swallow study showed  small hiatal hernia and evidence of acid reflux - No masses or strictures identified on imaging  Bowel habits and gastrointestinal symptoms - Diarrhea improved since switching from Pepto Bismol to Imodium - No recent episodes of  melena - No abdominal pain  Hepatic and renal findings - Abdominal ultrasound for evaluation of the liver revealed cysts on left kidney - CT scan scheduled for further evaluation of renal cysts  Dietary intake and appetite - Regular diet with daily breakfast and dinner; lunch intake inconsistent - Breakfast typically includes eggs, bacon, sausage, and sometimes potatoes or oatmeal - Dinner includes vegetables such as cabbage, peas, string beans, corn, and potatoes - Uncertain about appetite  Recent surgical history - Recent knee surgery with ongoing pain  Colorectal cancer screening - Last colonoscopy performed a few years ago showed a couple of polyps - No follow-up recommended due to age    Previous GI Procedures/Imaging   Barium swallow 06/01/2024 IMPRESSION: 1. Flash vestibular penetration.   2. Small hiatal hernia.   3. Mild reflux.  Abdominal ultrasound with elastography 06/01/2024 IMPRESSION: ULTRASOUND ABDOMEN:   Interval increased size of left interpolar renal cyst or versus cluster of cysts. Consider further evaluation with renal protocol MRI abdomen or CT abdomen.   ULTRASOUND HEPATIC ELASTOGRAPHY:   Median kPa:  2.8   Diagnostic category:  < or = 5 kPa: high probability of being normal  Abdominal ultrasound 01/29/2023 (for elevated LFTs) 1. No acute abnormality identified. 2. Fatty infiltration of the liver.   Colonoscopy 01/12/2021 - Two 5 to 6 mm polyps in the transverse colon and in the ascending colon, removed with a cold snare. Resected and retrieved.  - Mild diverticulosis in the sigmoid colon.  - Internal hemorrhoids.  - The examination was otherwise normal on direct and retroflexion views. - No recall due to age Path: Surgical [P], colon, ascending polyp x1, transverse polyp x1, polyp (2) - TUBULAR ADENOMA(S). - NO HIGH GRADE DYSPLASIA OR CARCINOMA.   EGD 12/2009 Normal EGD   Past Medical History:  Diagnosis Date   ALLERGIC RHINITIS  08/02/2009   Allergy    ANXIETY 10/18/2008   ARM PAIN, RIGHT 04/04/2009   Arthritis    BACK PAIN 08/02/2009   CHEST PAIN 12/05/2009   COLONIC POLYPS, HX OF 10/18/2008   DIZZINESS 12/05/2009   EPIGASTRIC PAIN 01/09/2010   FATIGUE 09/06/2009   GERD 12/06/2009   HOARSENESS 09/06/2009   HYPERLIPIDEMIA 10/18/2008   HYPERTENSION 10/18/2008   IBS 10/18/2008   MYALGIA 03/21/2009   Osteopenia 12/2018   T score -1.7 FRAX 12% / 2.5% table from prior DEXA 2018   PULMONARY SARCOIDOSIS 10/18/2008   SINUSITIS- ACUTE-NOS 03/21/2009   VOCAL CORD NODULE 09/06/2009    Past Surgical History:  Procedure Laterality Date   ABDOMINAL HYSTERECTOMY  08/1992   TAH-DR G FOR FIBROIDS AND MENORRHAGIA   COLONOSCOPY  11-24-2009   3 polyps, +TA and HPP   hx of vocal cord nodules     MUSCLE BIOPSY Right 08/23/2022   Procedure: RIGHT DELTOID MUSCLE BIOPSY;  Surgeon: Aron Shoulders, MD;  Location: [facility];  Service: General;  Laterality: Right;   s/p right finger tendon surgury     UPPER GASTROINTESTINAL ENDOSCOPY  01-08-2010   normal    Current Outpatient Medications  Medication Sig Dispense Refill   amLODipine  (NORVASC ) 10 MG tablet Take 1 tablet by mouth once daily 90 tablet 0   ascorbic acid (VITAMIN C) 500 MG tablet Take 500 mg by mouth daily.  cetirizine  (ZYRTEC ) 10 MG tablet Take 1 tablet by mouth once daily 90 tablet 0   cyclobenzaprine  (FLEXERIL ) 5 MG tablet Take 1 tablet (5 mg total) by mouth at bedtime as needed for muscle spasms. 90 tablet 3   DULoxetine  (CYMBALTA ) 30 MG capsule Take 1 capsule (30 mg total) by mouth 2 (two) times daily. 180 capsule 3   gatifloxacin (ZYMAXID) 0.5 % SOLN Place 1 drop into the right eye 4 (four) times daily.     ketorolac (ACULAR) 0.5 % ophthalmic solution Place 1 drop into the right eye 2 (two) times daily.     losartan  (COZAAR ) 50 MG tablet Take 1 tablet by mouth once daily 90 tablet 2   meloxicam  (MOBIC ) 15 MG tablet TAKE 1 TABLET BY MOUTH ONCE DAILY AS NEEDED  90 tablet 3   Methylcobalamin (B-12) 5000 MCG TBDP Take 5,000 mcg by mouth daily.     pantoprazole  (PROTONIX ) 40 MG tablet Take 1 tablet by mouth once daily 90 tablet 0   prednisoLONE acetate (PRED FORTE) 1 % ophthalmic suspension Place 1 drop into the right eye 4 (four) times daily.     RESTASIS 0.05 % ophthalmic emulsion SMARTSIG:1 In Eye(s) Twice Daily     rosuvastatin  (CRESTOR ) 10 MG tablet Take 1 tablet (10 mg total) by mouth daily. 90 tablet 3   traMADol  (ULTRAM ) 50 MG tablet TAKE 1 TABLET BY MOUTH EVERY 6 HOURS AS NEEDED 60 tablet 2   VITAMIN D , CHOLECALCIFEROL, PO Take 1 tablet by mouth daily.     No current facility-administered medications for this visit.    Allergies as of 06/15/2024 - Review Complete 06/15/2024  Allergen Reaction Noted   Grass pollen([id number]-o-r-[id number]-swt vern)  05/12/2024    Family History  Problem Relation Age of Onset   Diabetes Mother    Hypertension Mother    Breast cancer Mother 72   Hypertension Sister    Mental illness Daughter    Colon cancer Neg Hx    Colon polyps Neg Hx    Rectal cancer Neg Hx    Stomach cancer Neg Hx    Esophageal cancer Neg Hx     Social History[1]   Review of Systems:    Constitutional: Unintentional weight loss. No fever, chills Cardiovascular: No chest pain Respiratory: No SOB  Gastrointestinal: See HPI and otherwise negative   Physical Exam:  Vital signs: BP 118/78   Pulse 86   Ht 5' 3 (1.6 m)   Wt 107 lb 2 oz (48.6 kg)   BMI 18.98 kg/m   Wt Readings from Last 3 Encounters:  06/15/24 107 lb 2 oz (48.6 kg)  05/12/24 114 lb 6 oz (51.9 kg)  05/08/24 111 lb 6.4 oz (50.5 kg)    Constitutional: Pleasant, thin female in NAD, alert and cooperative Head:  Normocephalic and atraumatic.  Respiratory: Respirations even and unlabored. Lungs clear to auscultation bilaterally.  No wheezes, crackles, or rhonchi.  Cardiovascular:  Regular rate and rhythm. No murmurs. No peripheral edema. Gastrointestinal:  Soft,  nondistended, nontender. No rebound or guarding. Normal bowel sounds. No appreciable masses or hepatomegaly. Rectal:  Not performed.  Neurologic:  Alert and oriented x4;  grossly normal neurologically.  Skin:   Dry and intact without significant lesions or rashes. Psychiatric: Oriented to person, place and time. Demonstrates good judgement and reason without abnormal affect or behaviors.   RELEVANT LABS AND IMAGING: CBC    Component Value Date/Time   WBC 4.4 05/08/2024 1146   RBC 3.87 05/08/2024 1146  HGB 11.6 (L) 05/08/2024 1146   HCT 35.5 (L) 05/08/2024 1146   PLT 292.0 05/08/2024 1146   MCV 91.6 05/08/2024 1146   MCH 29.0 08/10/2022 0911   MCHC 32.8 05/08/2024 1146   RDW 14.9 05/08/2024 1146   LYMPHSABS 1.3 05/08/2024 1146   MONOABS 0.4 05/08/2024 1146   EOSABS 0.1 05/08/2024 1146   BASOSABS 0.1 05/08/2024 1146    CMP     Component Value Date/Time   NA 138 05/08/2024 1146   K 3.8 05/08/2024 1146   CL 100 05/08/2024 1146   CO2 28 05/08/2024 1146   GLUCOSE 85 05/08/2024 1146   BUN 14 05/08/2024 1146   CREATININE 0.52 05/08/2024 1146   CALCIUM  9.7 05/08/2024 1146   PROT 7.3 05/08/2024 1146   ALBUMIN 4.0 05/08/2024 1146   AST 51 (H) 05/08/2024 1146   ALT 37 (H) 05/08/2024 1146   ALKPHOS 30 (L) 05/08/2024 1146   BILITOT 0.5 05/08/2024 1146   GFRNONAA >60 08/10/2022 0911   GFRAA >60 10/13/2016 0931   Echocardiogram 08/15/2023 1. Left ventricular ejection fraction, by estimation, is 55 to 60% . Left ventricular ejection fraction by 3D volume is 58 % . The left ventricle has normal function. The left ventricle has no regional wall motion abnormalities. There is mild left ventricular hypertrophy of the basal- septal segment. Left ventricular diastolic parameters are indeterminate. The average left ventricular global longitudinal strain is 18. 0 % . The global longitudinal strain is normal.  2. Right ventricular systolic function is normal. The right ventricular size is  normal. There is normal pulmonary artery systolic pressure. The estimated right ventricular systolic pressure is 35. 3 mmHg.  3. The mitral valve is normal in structure. Mild mitral valve regurgitation. No evidence of mitral stenosis.  4. The aortic valve is tricuspid. Aortic valve regurgitation is trivial. Aortic valve sclerosis is present, with no evidence of aortic valve stenosis. Aortic regurgitation PHT measures 499 msec.  5. Pulmonic valve regurgitation is moderate.  6. The inferior vena cava is normal in size with greater than 50% respiratory variability, suggesting right atrial pressure of 3 mmHg.  Assessment/Plan:   Assessment & Plan Dysphagia and throat mucus Persistent dysphagia and mucus with weight loss. Barium swallow showed hiatal hernia and reflux, no masses noted. Upper endoscopy needed for further evaluation.  - Schedule upper endoscopy. I thoroughly discussed the procedure with the patient to include nature of the procedure, alternatives, benefits, and risks (including but not limited to bleeding, infection, perforation, anesthesia/cardiac/pulmonary complications). Patient verbalized understanding and gave verbal consent to proceed with procedure.   Unintentional weight loss Continued weight loss with no clear etiology. CT scan scheduled for further evaluation.  - Proceed with CT renal abdomen on 12/17. - If workup unrevealing as to source of weight loss will refer back to PCP. Consider chest CT, dietician consult, etc.  Hiatal hernia with gastroesophageal reflux disease Small hiatal hernia causing reflux symptoms. No concerning findings on imaging.  - Continue current GERD management.  Hepatic steatosis and elevated liver enzymes Previous workup showed no hepatitis or autoimmune disease. Mildly elevated immunoglobulin, non-specific. Liver ultrasound reassuring.  - Repeat immunoglobulin test today.  Renal cyst, left kidney Renal cysts on ultrasound, one appears to  have grown. CT scan scheduled for further evaluation.  - Proceed with CT scan of abdomen and pelvis on December 17th to evaluate renal cysts.    Camie Furbish, PA-C [facility] 06/15/2024, 10:09 AM  Patient Care Team: Norleen Lynwood ORN, MD as PCP -  General Fontaine, Evalene SQUIBB, MD (Inactive) as Consulting Physician (Gynecology) Aneita Gwendlyn DASEN, MD (Inactive) as Consulting Physician (Gastroenterology) Magdalen Pasco RAMAN, DPM as Consulting Physician (Podiatry) Tobie Tonita POUR, DO as Consulting Physician (Neurology)       [1]  Social History Tobacco Use   Smoking status: Never   Smokeless tobacco: Never  Vaping Use   Vaping status: Never Used  Substance Use Topics   Alcohol use: No    Alcohol/week: 0.0 standard drinks of alcohol   Drug use: No

## 2024-06-15 NOTE — Patient Instructions (Addendum)
 Your provider has requested that you go to the basement level for lab work before leaving today. Press B on the elevator. The lab is located at the first door on the left as you exit the elevator.   You have been scheduled for an endoscopy. Please follow written instructions given to you at your visit today.  If you use inhalers (even only as needed), please bring them with you on the day of your procedure.  If you take any of the following medications, they will need to be adjusted prior to your procedure:   DO NOT TAKE 7 DAYS PRIOR TO TEST- Trulicity (dulaglutide) Ozempic, Wegovy (semaglutide) Mounjaro, Zepbound (tirzepatide) Bydureon Bcise (exanatide extended release)  DO NOT TAKE 1 DAY PRIOR TO YOUR TEST Rybelsus (semaglutide) Adlyxin (lixisenatide) Victoza (liraglutide) Byetta (exanatide) ___________________________________________________________________________

## 2024-06-16 DIAGNOSIS — R29898 Other symptoms and signs involving the musculoskeletal system: Secondary | ICD-10-CM | POA: Diagnosis not present

## 2024-06-16 DIAGNOSIS — M25561 Pain in right knee: Secondary | ICD-10-CM | POA: Diagnosis not present

## 2024-06-16 DIAGNOSIS — R269 Unspecified abnormalities of gait and mobility: Secondary | ICD-10-CM | POA: Diagnosis not present

## 2024-06-16 DIAGNOSIS — M25661 Stiffness of right knee, not elsewhere classified: Secondary | ICD-10-CM | POA: Diagnosis not present

## 2024-06-16 LAB — IGG: IgG (Immunoglobin G), Serum: 1718 mg/dL — ABNORMAL HIGH (ref 600–1540)

## 2024-06-16 NOTE — Progress Notes (Signed)
 Attending Physician's Attestation   I have reviewed the chart.   I agree with the Advanced Practitioner's note, impression, and recommendations with any updates as below. CT scan reasonable as outlined for next step of evaluation as well.   Aloha Finner, MD [facility] Advanced Endoscopy Office # [phone]

## 2024-06-17 ENCOUNTER — Encounter (HOSPITAL_COMMUNITY): Payer: Self-pay

## 2024-06-17 ENCOUNTER — Ambulatory Visit (HOSPITAL_COMMUNITY): Admission: RE | Admit: 2024-06-17 | Discharge: 2024-06-17 | Attending: Gastroenterology

## 2024-06-17 ENCOUNTER — Telehealth: Payer: Self-pay | Admitting: Neurology

## 2024-06-17 DIAGNOSIS — N281 Cyst of kidney, acquired: Secondary | ICD-10-CM | POA: Insufficient documentation

## 2024-06-17 MED ORDER — IOHEXOL 300 MG/ML  SOLN
100.0000 mL | Freq: Once | INTRAMUSCULAR | Status: AC | PRN
  Administered 2024-06-17: 10:00:00 100 mL via INTRAVENOUS

## 2024-06-17 NOTE — Telephone Encounter (Signed)
 Pt called in this afternoon and she stated that she is having tingles all over her body. Pt wants to speak to someone to see can her prescription be change so she can get some control over the tingle . Pt stated that she is starting  to lose  weight. Thanks

## 2024-06-18 ENCOUNTER — Ambulatory Visit

## 2024-06-19 NOTE — Telephone Encounter (Signed)
 Please find out when this started, as she was being seen here for tingling in the legs, not all over the body.  Has there been any new medications or supplements?  Also, please confirm that she is taking duloxetine  30mg  twice daily.

## 2024-06-19 NOTE — Telephone Encounter (Signed)
 Called patient and she stated that the tingling all over her body started about 1 month ago. Patient stated that she has been taking her duloxetine  30 mg twice a day. Patient stated that tingling all over her body comes and goes and has just been happening for a month. Patient also stated she has lost 7-8 pounds in the past few months and is concerned. Patient reports she has not started any new medication or supplements.   Per Dr. Tobie please offer next available to be seen in clinic.

## 2024-06-23 ENCOUNTER — Ambulatory Visit: Payer: Self-pay | Admitting: Gastroenterology

## 2024-06-23 ENCOUNTER — Ambulatory Visit (INDEPENDENT_AMBULATORY_CARE_PROVIDER_SITE_OTHER): Admitting: Neurology

## 2024-06-23 VITALS — BP 138/77 | HR 80 | Ht 63.0 in | Wt 107.2 lb

## 2024-06-23 DIAGNOSIS — M48061 Spinal stenosis, lumbar region without neurogenic claudication: Secondary | ICD-10-CM | POA: Diagnosis not present

## 2024-06-23 NOTE — Progress Notes (Signed)
 "   Follow-up Visit   Date: 06/23/2024    Briana Schroeder MRN: [id number] DOB: [birth year]    Briana Schroeder is a 78 y.o. right-handed female with GERD, hypertension, and polyarthralgia returning to the clinic for follow-up of bilateral feet parethesias.  The patient was accompanied to the clinic by self.  IMPRESSION/PLAN: Assessment & Plan Lumbar spinal stenosis at L4-L5 with neuropathic pain and paresthesias, primarily in toes and legs. No evidence of large fiber neuropathy on NCS.  Duloxetine  provides partial relief. Symptoms likely due to spinal cord compression. No leg weakness or bowel/bladder dysfunction. Surgical intervention not recommended. Numbness may persist.   - Previously tried:  gabapentin  - Continue duloxetine  30 mg oral twice daily. - Continue physical therapy - Follow up with spine surgeon in January post knee surgery recovery.   --------------------------------------------- UPDATE 11/29/2022:  She is here for EDX.  She continues to have tingling involving both feet.  Prior NCS/EMG performed at [facility] showed findings consistent with right peroneal mononeuropathy, however these findings did match her clinical presentation.  She does not have foot weakness.  She denies low back pain.  UPDATE 02/19/2023:  Since her last visit, her feet tingling has improved.  She takes gabapentin  300mg  at bedtime which helps.  Her primary issue today is achy and throbbing left hip pain.  She denies weakness.  Pain is improved with tramadol , which is prescribed by her PCP.    UPDATE 08/26/2023:  She is here for 6 month follow-up visit.  She continues to have tingling in the feet, which is worse at night time.  She takes gabapentin  300mg  at bedtime which helps some.  No weakness.  She has a lot of generalized joint pain and is followed by rheumatology.    UPDATE 12/24/2023:  She is here for sooner follow-up visit because of worsening bilateral toe tingling, which makes it difficult  for her to sleep at night.  She is taking gabapentin  900mg  without any relief and would like to try something different.  MRI lumbar spine from 2024 shows spinal canal stenosis at L4-5. She denies leg heaviness, difficulty with walking, weakness, or low back pain.   UPDATE 02/19/2024:  She is here for follow-up visit.  She is tolerating cymbalta  30mg  and reports that tingling is somewhat better.  She continues to have painful tingling in the feet some nights.  She is doing better off gabapentin .   She has right knee pain which is followed by Dr. Kay.  She is planning for right TKA in November.  She denies leg heaviness, difficulty walking, weakness, or low back pain.    UPDATE 06/23/2024:  Discussed the use of AI scribe software for clinical note transcription with the patient, who gave verbal consent to proceed.  History of Present Illness Briana Schroeder is a 78 year old female with lumbar spinal stenosis who presents with persistent tingling in her toes and legs.  She experiences persistent tingling in her toes, primarily at night, and sometimes all over her lower body. Despite taking duloxetine  30mg  (Cymbalta ) twice daily, the symptoms persist, with the tingling mostly in her legs. She reports no shooting pain, no issues with bladder and bowel control, and no leg weakness except in the leg with recent surgery. She does not use a cane for walking.  She has a history of lumbar spinal stenosis, evaluated by [facility] in October. They recommended physical therapy, which she is currently undergoing.  She underwent knee replacement surgery on November  14th and is currently receiving physical therapy for her right knee. She attends therapy sessions twice a week and reports improvement in her knee function, although she still experiences some weakness in the leg that underwent surgery.   Medications:  Current Outpatient Medications on File Prior to Visit  Medication Sig Dispense  Refill   amLODipine  (NORVASC ) 10 MG tablet Take 1 tablet by mouth once daily 90 tablet 0   ascorbic acid (VITAMIN C) 500 MG tablet Take 500 mg by mouth daily.     cetirizine  (ZYRTEC ) 10 MG tablet Take 1 tablet by mouth once daily 90 tablet 0   cyclobenzaprine  (FLEXERIL ) 5 MG tablet Take 1 tablet (5 mg total) by mouth at bedtime as needed for muscle spasms. 90 tablet 3   DULoxetine  (CYMBALTA ) 30 MG capsule Take 1 capsule (30 mg total) by mouth 2 (two) times daily. 180 capsule 3   gatifloxacin (ZYMAXID) 0.5 % SOLN Place 1 drop into the right eye 4 (four) times daily.     ketorolac (ACULAR) 0.5 % ophthalmic solution Place 1 drop into the right eye 2 (two) times daily.     losartan  (COZAAR ) 50 MG tablet Take 1 tablet by mouth once daily 90 tablet 2   meloxicam  (MOBIC ) 15 MG tablet TAKE 1 TABLET BY MOUTH ONCE DAILY AS NEEDED 90 tablet 3   Methylcobalamin (B-12) 5000 MCG TBDP Take 5,000 mcg by mouth daily.     pantoprazole  (PROTONIX ) 40 MG tablet Take 1 tablet by mouth once daily 90 tablet 0   RESTASIS 0.05 % ophthalmic emulsion SMARTSIG:1 In Eye(s) Twice Daily     rosuvastatin  (CRESTOR ) 10 MG tablet Take 1 tablet (10 mg total) by mouth daily. 90 tablet 3   traMADol  (ULTRAM ) 50 MG tablet TAKE 1 TABLET BY MOUTH EVERY 6 HOURS AS NEEDED 60 tablet 2   VITAMIN D , CHOLECALCIFEROL, PO Take 1 tablet by mouth daily.     No current facility-administered medications on file prior to visit.    Allergies:  Allergies  Allergen Reactions   Grass Pollen(K-O-R-T-Swt Vern)     Other Reaction(s): Not available    Vital Signs:  BP 138/77   Pulse 80   Ht 5' 3 (1.6 m)   Wt 107 lb 3.2 oz (48.6 kg)   SpO2 99%   BMI 18.99 kg/m    Neurological Exam: MENTAL STATUS including orientation to time, place, person, recent and remote memory, attention span and concentration, language, and fund of knowledge is normal.  Speech is not dysarthric.  CRANIAL NERVES:   Normal conjugate, extra-ocular eye movements in all  directions of gaze.  No ptosis.  Face is symmetric.   MOTOR:  Motor strength is 5/5 in all extremities.  No atrophy, fasciculations or abnormal movements.  No pronator drift.  Tone is normal.    REFLEXES:  Reflexes are 2+/4 throughout, except 3+/4 at the knees.   SENSATION:  Intact to vibration throughout.   COORDINATION/GAIT:  Normal finger-to- nose-finger.  Intact rapid alternating movements bilaterally.  Gait slightly antalgic with right leg mildly externally rotated, unassisted, stable.  Data: NCS/EMG of the legs 11/29/2022: Chronic L5 radiculopathy affecting bilateral lower extremities, mild and worse on the right. There is no evidence of a large fiber sensorimotor polyneuropathy affecting the lower extremities.  MRI lumbar spine 05/15/2023: 1. L3-4: Advanced bilateral facet arthritis with degenerative anterolisthesis of 3 mm. Mild canal narrowing but no visible neural compression. The facet arthritis could be symptomatic. This has worsened since 2018. 2. L4-5:  Advanced bilateral facet arthropathy with anterolisthesis of 9 mm. Bulging of the disc. Facet hypertrophy is more severe on the left than the right. There is multifactorial spinal stenosis at this level that could cause neural compression on either or both sides, particularly on the left. The facet arthritis could be painful. This has also worsened since 2018.  Thank you for allowing me to participate in patient's care.  If I can answer any additional questions, I would be pleased to do so.    Sincerely,    Syona Wroblewski K. Tobie, DO   "

## 2024-06-23 NOTE — Patient Instructions (Signed)
 When you are recovered from your knee surgery, you can call [phone] to schedule follow-up with Dr. Dorn Ned

## 2024-06-25 ENCOUNTER — Other Ambulatory Visit: Payer: Self-pay | Admitting: Internal Medicine

## 2024-06-28 ENCOUNTER — Other Ambulatory Visit: Payer: Self-pay | Admitting: Internal Medicine

## 2024-06-29 ENCOUNTER — Other Ambulatory Visit: Payer: Self-pay

## 2024-06-30 ENCOUNTER — Other Ambulatory Visit: Payer: Self-pay

## 2024-06-30 DIAGNOSIS — R634 Abnormal weight loss: Secondary | ICD-10-CM

## 2024-06-30 DIAGNOSIS — R945 Abnormal results of liver function studies: Secondary | ICD-10-CM

## 2024-06-30 NOTE — Telephone Encounter (Signed)
 Patient returning call. Advised patient a MyChart message was sent as well. Patient would like a call back. Please advise, thank you

## 2024-07-06 NOTE — Progress Notes (Signed)
 Spoke with the patient. She asks if the labs could be due to her weight loss. Agrees to return on or after 07/22/24. She asks for a reminder call to come for labs.

## 2024-07-07 ENCOUNTER — Ambulatory Visit: Payer: Self-pay | Admitting: Gastroenterology

## 2024-07-15 ENCOUNTER — Ambulatory Visit: Admitting: Internal Medicine

## 2024-07-15 ENCOUNTER — Ambulatory Visit

## 2024-07-15 ENCOUNTER — Ambulatory Visit: Payer: Self-pay | Admitting: Internal Medicine

## 2024-07-15 VITALS — BP 116/70 | HR 86 | Temp 98.1°F | Ht 63.0 in | Wt 105.0 lb

## 2024-07-15 DIAGNOSIS — D649 Anemia, unspecified: Secondary | ICD-10-CM | POA: Diagnosis not present

## 2024-07-15 DIAGNOSIS — R634 Abnormal weight loss: Secondary | ICD-10-CM

## 2024-07-15 DIAGNOSIS — E7800 Pure hypercholesterolemia, unspecified: Secondary | ICD-10-CM | POA: Diagnosis not present

## 2024-07-15 DIAGNOSIS — I10 Essential (primary) hypertension: Secondary | ICD-10-CM | POA: Diagnosis not present

## 2024-07-15 DIAGNOSIS — E559 Vitamin D deficiency, unspecified: Secondary | ICD-10-CM | POA: Diagnosis not present

## 2024-07-15 DIAGNOSIS — R739 Hyperglycemia, unspecified: Secondary | ICD-10-CM

## 2024-07-15 LAB — LIPID PANEL
Cholesterol: 152 mg/dL (ref 28–200)
HDL: 69.2 mg/dL
LDL Cholesterol: 57 mg/dL (ref 10–99)
NonHDL: 82.52
Total CHOL/HDL Ratio: 2
Triglycerides: 128 mg/dL (ref 10.0–149.0)
VLDL: 25.6 mg/dL (ref 0.0–40.0)

## 2024-07-15 LAB — CBC WITH DIFFERENTIAL/PLATELET
Basophils Absolute: 0.1 K/uL (ref 0.0–0.1)
Basophils Relative: 1.3 % (ref 0.0–3.0)
Eosinophils Absolute: 0.2 K/uL (ref 0.0–0.7)
Eosinophils Relative: 3.9 % (ref 0.0–5.0)
HCT: 34.8 % — ABNORMAL LOW (ref 36.0–46.0)
Hemoglobin: 11.5 g/dL — ABNORMAL LOW (ref 12.0–15.0)
Lymphocytes Relative: 24.6 % (ref 12.0–46.0)
Lymphs Abs: 1.2 K/uL (ref 0.7–4.0)
MCHC: 32.9 g/dL (ref 30.0–36.0)
MCV: 90.2 fl (ref 78.0–100.0)
Monocytes Absolute: 0.5 K/uL (ref 0.1–1.0)
Monocytes Relative: 9.5 % (ref 3.0–12.0)
Neutro Abs: 3.1 K/uL (ref 1.4–7.7)
Neutrophils Relative %: 60.7 % (ref 43.0–77.0)
Platelets: 271 K/uL (ref 150.0–400.0)
RBC: 3.86 Mil/uL — ABNORMAL LOW (ref 3.87–5.11)
RDW: 16.5 % — ABNORMAL HIGH (ref 11.5–15.5)
WBC: 5.1 K/uL (ref 4.0–10.5)

## 2024-07-15 LAB — TSH: TSH: 0.76 u[IU]/mL (ref 0.35–5.50)

## 2024-07-15 LAB — URINALYSIS, ROUTINE W REFLEX MICROSCOPIC
Bilirubin Urine: NEGATIVE
Hgb urine dipstick: NEGATIVE
Nitrite: NEGATIVE
RBC / HPF: NONE SEEN
Specific Gravity, Urine: 1.02 (ref 1.000–1.030)
Total Protein, Urine: NEGATIVE
Urine Glucose: NEGATIVE
Urobilinogen, UA: 0.2 (ref 0.0–1.0)
pH: 6 (ref 5.0–8.0)

## 2024-07-15 LAB — BASIC METABOLIC PANEL WITH GFR
BUN: 25 mg/dL — ABNORMAL HIGH (ref 6–23)
CO2: 27 meq/L (ref 19–32)
Calcium: 9.8 mg/dL (ref 8.4–10.5)
Chloride: 104 meq/L (ref 96–112)
Creatinine, Ser: 0.74 mg/dL (ref 0.40–1.20)
GFR: 76.75 mL/min
Glucose, Bld: 88 mg/dL (ref 70–99)
Potassium: 3.4 meq/L — ABNORMAL LOW (ref 3.5–5.1)
Sodium: 138 meq/L (ref 135–145)

## 2024-07-15 LAB — IBC PANEL
Iron: 61 ug/dL (ref 42–145)
Saturation Ratios: 22.9 % (ref 20.0–50.0)
TIBC: 266 ug/dL (ref 250.0–450.0)
Transferrin: 190 mg/dL — ABNORMAL LOW (ref 212.0–360.0)

## 2024-07-15 LAB — HEPATIC FUNCTION PANEL
ALT: 31 U/L (ref 3–35)
AST: 36 U/L (ref 5–37)
Albumin: 4.1 g/dL (ref 3.5–5.2)
Alkaline Phosphatase: 32 U/L — ABNORMAL LOW (ref 39–117)
Bilirubin, Direct: 0 mg/dL — ABNORMAL LOW (ref 0.1–0.3)
Total Bilirubin: 0.4 mg/dL (ref 0.2–1.2)
Total Protein: 7.6 g/dL (ref 6.0–8.3)

## 2024-07-15 LAB — HEMOGLOBIN A1C: Hgb A1c MFr Bld: 5.7 % (ref 4.6–6.5)

## 2024-07-15 LAB — FERRITIN: Ferritin: 189.2 ng/mL (ref 10.0–291.0)

## 2024-07-15 NOTE — Progress Notes (Signed)
 The test results show that your current treatment is OK, as the tests are stable.  Please continue the same plan.  There is no other need for change of treatment or further evaluation based on these results, at this time.  thanks

## 2024-07-15 NOTE — Progress Notes (Unsigned)
 Patient ID: Briana Schroeder, female   DOB: 10/04/1945, 79 y.o.   MRN: [id number]        Chief Complaint: follow up weight loss        HPI:  Briana Schroeder is a 79 y.o. female here with c/o persistent wt loss, unable to improve, despite good appetite, she and husband with here are concerned.  Has hx of pulmonary sarcoid but no recent worsening cough, sob.  Has hx of PMR but no recent shoulder stiffness of pain.  No known hx of malignancy.  Denies significant depression.  Denies worsening reflux, abd pain, dysphagia, n/v, bowel change or blood.  Pt denies chest pain, increased sob or doe, wheezing, orthopnea, PND, increased LE swelling, palpitations, dizziness or syncope.  Has lost another 2 lbs since xmas.         Wt Readings from Last 3 Encounters:  07/15/24 105 lb (47.6 kg)  06/23/24 107 lb 3.2 oz (48.6 kg)  06/15/24 107 lb 2 oz (48.6 kg)   BP Readings from Last 3 Encounters:  07/15/24 116/70  06/23/24 138/77  06/15/24 118/78         Past Medical History:  Diagnosis Date   ALLERGIC RHINITIS 08/02/2009   Allergy    ANXIETY 10/18/2008   ARM PAIN, RIGHT 04/04/2009   Arthritis    BACK PAIN 08/02/2009   CHEST PAIN 12/05/2009   COLONIC POLYPS, HX OF 10/18/2008   DIZZINESS 12/05/2009   EPIGASTRIC PAIN 01/09/2010   FATIGUE 09/06/2009   GERD 12/06/2009   HOARSENESS 09/06/2009   HYPERLIPIDEMIA 10/18/2008   HYPERTENSION 10/18/2008   IBS 10/18/2008   MYALGIA 03/21/2009   Osteopenia 12/2018   T score -1.7 FRAX 12% / 2.5% table from prior DEXA 2018   PULMONARY SARCOIDOSIS 10/18/2008   SINUSITIS- ACUTE-NOS 03/21/2009   VOCAL CORD NODULE 09/06/2009   Past Surgical History:  Procedure Laterality Date   ABDOMINAL HYSTERECTOMY  08/1992   TAH-DR G FOR FIBROIDS AND MENORRHAGIA   COLONOSCOPY  11-24-2009   3 polyps, +TA and HPP   hx of vocal cord nodules     MUSCLE BIOPSY Right 08/23/2022   Procedure: RIGHT DELTOID MUSCLE BIOPSY;  Surgeon: Aron Shoulders, MD;  Location: [facility];   Service: General;  Laterality: Right;   s/p right finger tendon surgury     UPPER GASTROINTESTINAL ENDOSCOPY  01-08-2010   normal    reports that she has never smoked. She has never used smokeless tobacco. She reports that she does not drink alcohol and does not use drugs. family history includes Breast cancer (age of onset: 15) in her mother; Diabetes in her mother; Hypertension in her mother and sister; Mental illness in her daughter. Allergies[1] Medications Ordered Prior to Encounter[2]      ROS:  All others reviewed and negative.  Objective        PE:  BP 116/70   Pulse 86   Temp 98.1 F (36.7 C) (Temporal)   Ht 5' 3 (1.6 m)   Wt 105 lb (47.6 kg)   SpO2 98%   BMI 18.60 kg/m                 Constitutional: Pt appears thin, mild underwt               HENT: Head: NCAT.                Right Ear: External ear normal.  Left Ear: External ear normal.                Eyes: . Pupils are equal, round, and reactive to light. Conjunctivae and EOM are normal               Nose: without d/c or deformity               Neck: Neck supple. Gross normal ROM               Cardiovascular: Normal rate and regular rhythm.                 Pulmonary/Chest: Effort normal and breath sounds without rales or wheezing.                Abd:  Soft, NT, ND, + BS, no organomegaly               Neurological: Pt is alert. At baseline orientation, motor grossly intact               Skin: Skin is warm. No rashes, no other new lesions, LE edema - none               Psychiatric: Pt behavior is normal without agitation   Micro: none  Cardiac tracings I have personally interpreted today:  none  Pertinent Radiological findings (summarize): none   Lab Results  Component Value Date   WBC 5.1 07/15/2024   HGB 11.5 (L) 07/15/2024   HCT 34.8 (L) 07/15/2024   PLT 271.0 07/15/2024   GLUCOSE 88 07/15/2024   CHOL 152 07/15/2024   TRIG 128.0 07/15/2024   HDL 69.20 07/15/2024   LDLDIRECT 83.3  11/17/2012   LDLCALC 57 07/15/2024   ALT 31 07/15/2024   AST 36 07/15/2024   NA 138 07/15/2024   K 3.4 (L) 07/15/2024   CL 104 07/15/2024   CREATININE 0.74 07/15/2024   BUN 25 (H) 07/15/2024   CO2 27 07/15/2024   TSH 0.76 07/15/2024   INR 1.1 (H) 05/12/2024   HGBA1C 5.7 07/15/2024   MICROALBUR 4.3 (H) 04/17/2024   Assessment/Plan:  Briana Schroeder is a 79 y.o. Black or African American [2] female with  has a past medical history of ALLERGIC RHINITIS (08/02/2009), Allergy, ANXIETY (10/18/2008), ARM PAIN, RIGHT (04/04/2009), Arthritis, BACK PAIN (08/02/2009), CHEST PAIN (12/05/2009), COLONIC POLYPS, HX OF (10/18/2008), DIZZINESS (12/05/2009), EPIGASTRIC PAIN (01/09/2010), FATIGUE (09/06/2009), GERD (12/06/2009), HOARSENESS (09/06/2009), HYPERLIPIDEMIA (10/18/2008), HYPERTENSION (10/18/2008), IBS (10/18/2008), MYALGIA (03/21/2009), Osteopenia (12/2018), PULMONARY SARCOIDOSIS (10/18/2008), SINUSITIS- ACUTE-NOS (03/21/2009), and VOCAL CORD NODULE (09/06/2009).  Weight loss ? Geriatric decline vs other, for cxr today, refer nutrition, boost bid asd, Lab today including cmet, cbc, and consider CT chest abd pelvis for persistent concern   Vitamin D  deficiency Last vitamin D  Lab Results  Component Value Date   VD25OH 68.14 04/17/2024   Stable, cont oral replacement   Essential hypertension BP Readings from Last 3 Encounters:  07/15/24 116/70  06/23/24 138/77  06/15/24 118/78   Stable, pt to continue medical treatment norvasc  10 every day, losartan  50 qd   Anemia Also for iron ferritin with labs;   Lab Results  Component Value Date   WBC 5.1 07/15/2024   HGB 11.5 (L) 07/15/2024   HCT 34.8 (L) 07/15/2024   MCV 90.2 07/15/2024   PLT 271.0 07/15/2024    Hyperlipidemia Lab Results  Component Value Date   LDLCALC 57 07/15/2024   Stable, pt to continue current statin crestor  10 qd  Followup: Return in about 6 months (around 01/12/2025).  Lynwood Rush, MD 07/17/2024 6:01  AM  Medical Group [facility]     [1]  Allergies Allergen Reactions   Grass Pollen(K-O-R-T-Swt Vern)     Other Reaction(s): Not available  [2]  Current Outpatient Medications on File Prior to Visit  Medication Sig Dispense Refill   amLODipine  (NORVASC ) 10 MG tablet Take 1 tablet by mouth once daily 90 tablet 0   ascorbic acid (VITAMIN C) 500 MG tablet Take 500 mg by mouth daily.     cetirizine  (ZYRTEC ) 10 MG tablet Take 1 tablet by mouth once daily 90 tablet 0   cyclobenzaprine  (FLEXERIL ) 5 MG tablet Take 1 tablet (5 mg total) by mouth at bedtime as needed for muscle spasms. 90 tablet 3   DULoxetine  (CYMBALTA ) 30 MG capsule Take 1 capsule (30 mg total) by mouth 2 (two) times daily. 180 capsule 3   gatifloxacin (ZYMAXID) 0.5 % SOLN Place 1 drop into the right eye 4 (four) times daily.     ketorolac (ACULAR) 0.5 % ophthalmic solution Place 1 drop into the right eye 2 (two) times daily.     losartan  (COZAAR ) 50 MG tablet Take 1 tablet by mouth once daily 90 tablet 2   meloxicam  (MOBIC ) 15 MG tablet TAKE 1 TABLET BY MOUTH ONCE DAILY AS NEEDED 90 tablet 3   Methylcobalamin (B-12) 5000 MCG TBDP Take 5,000 mcg by mouth daily.     pantoprazole  (PROTONIX ) 40 MG tablet Take 1 tablet by mouth once daily 90 tablet 0   RESTASIS 0.05 % ophthalmic emulsion SMARTSIG:1 In Eye(s) Twice Daily     rosuvastatin  (CRESTOR ) 10 MG tablet Take 1 tablet (10 mg total) by mouth daily. 90 tablet 3   traMADol  (ULTRAM ) 50 MG tablet TAKE 1 TABLET BY MOUTH EVERY 6 HOURS AS NEEDED 60 tablet 2   VITAMIN D , CHOLECALCIFEROL, PO Take 1 tablet by mouth daily.     No current facility-administered medications on file prior to visit.

## 2024-07-15 NOTE — Patient Instructions (Signed)
 Please continue all other medications as before, and refills have been done if requested.  Please have the pharmacy call with any other refills you may need.  Please continue your efforts at being more active, low cholesterol diet  Please keep your appointments with your specialists as you may have planned  You will be contacted regarding the referral for: Nutrition  Please go to the XRAY Department in the first floor for the x-ray testing  Please go to the LAB at the blood drawing area for the tests to be done  You will be contacted by phone if any changes need to be made immediately.  Otherwise, you will receive a letter about your results with an explanation, but please check with MyChart first.  Please make an Appointment to return in 6 months, or sooner if needed

## 2024-07-16 ENCOUNTER — Ambulatory Visit

## 2024-07-17 ENCOUNTER — Encounter: Payer: Self-pay | Admitting: Internal Medicine

## 2024-07-17 NOTE — Assessment & Plan Note (Signed)
 BP Readings from Last 3 Encounters:  07/15/24 116/70  06/23/24 138/77  06/15/24 118/78   Stable, pt to continue medical treatment norvasc  10 every day, losartan  50 qd

## 2024-07-17 NOTE — Assessment & Plan Note (Signed)
?   Geriatric decline vs other, for cxr today, refer nutrition, boost bid asd, Lab today including cmet, cbc, and consider CT chest abd pelvis for persistent concern

## 2024-07-17 NOTE — Assessment & Plan Note (Signed)
 Lab Results  Component Value Date   LDLCALC 57 07/15/2024   Stable, pt to continue current statin crestor  10 qd

## 2024-07-17 NOTE — Assessment & Plan Note (Signed)
 Last vitamin D  Lab Results  Component Value Date   VD25OH 68.14 04/17/2024   Stable, cont oral replacement

## 2024-07-17 NOTE — Assessment & Plan Note (Addendum)
 Also for iron ferritin with labs;   Lab Results  Component Value Date   WBC 5.1 07/15/2024   HGB 11.5 (L) 07/15/2024   HCT 34.8 (L) 07/15/2024   MCV 90.2 07/15/2024   PLT 271.0 07/15/2024

## 2024-07-21 ENCOUNTER — Telehealth: Payer: Self-pay

## 2024-07-21 NOTE — Telephone Encounter (Signed)
 Copied from CRM [id number]. Topic: Clinical - Medication Question >> Jul 21, 2024  2:03 PM Briana Schroeder wrote: Reason for CRM: Patient would like to give Response to Dr. Nicola question she is having tingling sensation all over. Believes he prescribed prednisone  for it but not too sure. Please call to confirm.

## 2024-07-22 ENCOUNTER — Telehealth: Payer: Self-pay

## 2024-07-22 NOTE — Telephone Encounter (Signed)
 No, we really dont have a good reason for prednisone , so I would hold on taking this for now, and hopefully she will hear about the Nutrition referral soon    thanks

## 2024-07-22 NOTE — Telephone Encounter (Signed)
 Copied from CRM #[id number]. Topic: Referral - Status >> Jul 22, 2024 10:42 AM Zy'onna H wrote: Reason for CRM: Patient called in to see what the status was of her most recent referral.  She stated she is concerned as she has continued to lose weight on a continuous basis.  Miss. Glennice stated she addressed these concerns at her most recent appt with PCP.  I see a recent referral to Nutrition is pending.  Please Advise the patient as to what the next steps will be.

## 2024-07-23 ENCOUNTER — Other Ambulatory Visit (INDEPENDENT_AMBULATORY_CARE_PROVIDER_SITE_OTHER)

## 2024-07-23 DIAGNOSIS — R634 Abnormal weight loss: Secondary | ICD-10-CM

## 2024-07-23 DIAGNOSIS — R945 Abnormal results of liver function studies: Secondary | ICD-10-CM

## 2024-07-23 LAB — COMPREHENSIVE METABOLIC PANEL WITH GFR
ALT: 33 U/L (ref 3–35)
AST: 38 U/L — ABNORMAL HIGH (ref 5–37)
Albumin: 3.9 g/dL (ref 3.5–5.2)
Alkaline Phosphatase: 29 U/L — ABNORMAL LOW (ref 39–117)
BUN: 16 mg/dL (ref 6–23)
CO2: 27 meq/L (ref 19–32)
Calcium: 9.6 mg/dL (ref 8.4–10.5)
Chloride: 106 meq/L (ref 96–112)
Creatinine, Ser: 0.74 mg/dL (ref 0.40–1.20)
GFR: 76.73 mL/min
Glucose, Bld: 81 mg/dL (ref 70–99)
Potassium: 3.6 meq/L (ref 3.5–5.1)
Sodium: 139 meq/L (ref 135–145)
Total Bilirubin: 0.4 mg/dL (ref 0.2–1.2)
Total Protein: 7.2 g/dL (ref 6.0–8.3)

## 2024-07-23 LAB — PROTIME-INR
INR: 1 ratio (ref 0.8–1.0)
Prothrombin Time: 11.5 s (ref 9.6–13.1)

## 2024-07-23 NOTE — Telephone Encounter (Signed)
 Called and left voicemail for the Pt to give the office a callback for the information to where her referral was sent. Please give the Pt they phone number when she calls back.    Referring To Provider Information [facility] [address] [place] [address] [phone]

## 2024-07-24 LAB — CERULOPLASMIN: Ceruloplasmin: 27 mg/dL (ref 14–48)

## 2024-07-24 LAB — IGG: IgG (Immunoglobin G), Serum: 1569 mg/dL — ABNORMAL HIGH (ref 600–1540)

## 2024-07-24 NOTE — Telephone Encounter (Signed)
 Patient refused nutrition referral

## 2024-07-28 ENCOUNTER — Ambulatory Visit: Payer: Self-pay | Admitting: Gastroenterology

## 2024-07-29 ENCOUNTER — Ambulatory Visit

## 2024-08-04 ENCOUNTER — Ambulatory Visit: Admitting: Gastroenterology

## 2024-08-04 NOTE — Progress Notes (Unsigned)
 Pt did receive prep instructions for her EGD. PT ate this am. Appt rescheduled for March 8 and prep instructions discussed with patient. She verbalized understanding.

## 2024-08-05 ENCOUNTER — Ambulatory Visit: Admitting: Gastroenterology

## 2024-08-18 ENCOUNTER — Ambulatory Visit

## 2024-08-25 ENCOUNTER — Ambulatory Visit: Admitting: Neurology

## 2024-09-16 ENCOUNTER — Encounter: Admitting: Gastroenterology

## 2025-02-22 ENCOUNTER — Ambulatory Visit

## 2025-02-23 ENCOUNTER — Ambulatory Visit
